# Patient Record
Sex: Female | Born: 1935
Health system: Southern US, Community
[De-identification: ages and names within clinical notes are randomized; demographics above are authoritative.]

## PROBLEM LIST (undated history)

## (undated) DIAGNOSIS — D61818 Other pancytopenia: Secondary | ICD-10-CM

## (undated) DIAGNOSIS — C9110 Chronic lymphocytic leukemia of B-cell type not having achieved remission: Secondary | ICD-10-CM

## (undated) DIAGNOSIS — C50919 Malignant neoplasm of unspecified site of unspecified female breast: Secondary | ICD-10-CM

## (undated) DIAGNOSIS — F419 Anxiety disorder, unspecified: Secondary | ICD-10-CM

## (undated) DIAGNOSIS — R011 Cardiac murmur, unspecified: Secondary | ICD-10-CM

## (undated) HISTORY — PX: BREAST SURGERY: SHX581

## (undated) HISTORY — PX: VAGINAL HYSTERECTOMY: SUR661

## (undated) HISTORY — PX: OTHER SURGICAL HISTORY: SHX169

---

## 1997-04-06 ENCOUNTER — Other Ambulatory Visit: Admission: RE | Admit: 1997-04-06 | Discharge: 1997-04-06 | Payer: Self-pay | Admitting: Obstetrics and Gynecology

## 1998-03-23 ENCOUNTER — Inpatient Hospital Stay (HOSPITAL_COMMUNITY): Admission: RE | Admit: 1998-03-23 | Discharge: 1998-03-25 | Payer: Self-pay | Admitting: Obstetrics and Gynecology

## 1999-05-10 ENCOUNTER — Other Ambulatory Visit: Admission: RE | Admit: 1999-05-10 | Discharge: 1999-05-10 | Payer: Self-pay | Admitting: Obstetrics and Gynecology

## 2001-06-26 ENCOUNTER — Other Ambulatory Visit: Admission: RE | Admit: 2001-06-26 | Discharge: 2001-06-26 | Payer: Self-pay | Admitting: Obstetrics and Gynecology

## 2001-10-05 ENCOUNTER — Encounter: Payer: Self-pay | Admitting: Internal Medicine

## 2001-10-05 ENCOUNTER — Encounter: Admission: RE | Admit: 2001-10-05 | Discharge: 2001-10-05 | Payer: Self-pay | Admitting: Internal Medicine

## 2001-11-03 ENCOUNTER — Encounter: Payer: Self-pay | Admitting: Orthopaedic Surgery

## 2001-11-05 ENCOUNTER — Ambulatory Visit (HOSPITAL_COMMUNITY): Admission: RE | Admit: 2001-11-05 | Discharge: 2001-11-06 | Payer: Self-pay | Admitting: Orthopaedic Surgery

## 2001-11-05 ENCOUNTER — Encounter: Payer: Self-pay | Admitting: Orthopaedic Surgery

## 2001-11-05 ENCOUNTER — Encounter (INDEPENDENT_AMBULATORY_CARE_PROVIDER_SITE_OTHER): Payer: Self-pay | Admitting: *Deleted

## 2001-12-01 ENCOUNTER — Other Ambulatory Visit: Admission: RE | Admit: 2001-12-01 | Discharge: 2001-12-01 | Payer: Self-pay | Admitting: *Deleted

## 2003-10-10 ENCOUNTER — Other Ambulatory Visit: Admission: RE | Admit: 2003-10-10 | Discharge: 2003-10-10 | Payer: Self-pay | Admitting: Obstetrics and Gynecology

## 2004-03-22 ENCOUNTER — Ambulatory Visit: Payer: Self-pay | Admitting: Oncology

## 2004-11-21 ENCOUNTER — Ambulatory Visit: Payer: Self-pay | Admitting: Oncology

## 2005-03-22 ENCOUNTER — Ambulatory Visit: Payer: Self-pay | Admitting: Oncology

## 2005-07-18 ENCOUNTER — Ambulatory Visit: Payer: Self-pay | Admitting: Oncology

## 2005-07-26 LAB — CBC WITH DIFFERENTIAL/PLATELET
Basophils Absolute: 0.1 10*3/uL (ref 0.0–0.1)
Eosinophils Absolute: 0.1 10*3/uL (ref 0.0–0.5)
HGB: 11.5 g/dL — ABNORMAL LOW (ref 11.6–15.9)
MCV: 107.8 fL — ABNORMAL HIGH (ref 81.0–101.0)
MONO#: 0.3 10*3/uL (ref 0.1–0.9)
NEUT#: 1.3 10*3/uL — ABNORMAL LOW (ref 1.5–6.5)
RBC: 3.16 10*6/uL — ABNORMAL LOW (ref 3.70–5.32)
RDW: 14.5 % (ref 11.3–14.5)
WBC: 30.3 10*3/uL — ABNORMAL HIGH (ref 3.9–10.0)
lymph#: 28.5 10*3/uL — ABNORMAL HIGH (ref 0.9–3.3)

## 2005-07-26 LAB — URINALYSIS, MICROSCOPIC - CHCC
COMMENTS:: NEGATIVE
Specific Gravity, Urine: 1.015 (ref 1.003–1.035)
pH: 6.5 (ref 4.6–8.0)

## 2005-07-30 LAB — URINE CULTURE

## 2005-10-23 ENCOUNTER — Ambulatory Visit: Payer: Self-pay | Admitting: Oncology

## 2005-10-25 LAB — CBC WITH DIFFERENTIAL/PLATELET
Basophils Absolute: 0.2 10*3/uL — ABNORMAL HIGH (ref 0.0–0.1)
EOS%: 0.3 % (ref 0.0–7.0)
HGB: 11.7 g/dL (ref 11.6–15.9)
MCH: 36.6 pg — ABNORMAL HIGH (ref 26.0–34.0)
MCV: 109 fL — ABNORMAL HIGH (ref 81.0–101.0)
MONO%: 1.1 % (ref 0.0–13.0)
NEUT#: 6.1 10*3/uL (ref 1.5–6.5)
RBC: 3.19 10*6/uL — ABNORMAL LOW (ref 3.70–5.32)
RDW: 15.1 % — ABNORMAL HIGH (ref 11.3–14.5)
lymph#: 41.5 10*3/uL — ABNORMAL HIGH (ref 0.9–3.3)

## 2006-01-28 ENCOUNTER — Ambulatory Visit: Payer: Self-pay | Admitting: Oncology

## 2006-01-31 LAB — CBC WITH DIFFERENTIAL/PLATELET
Basophils Absolute: 0.3 10*3/uL — ABNORMAL HIGH (ref 0.0–0.1)
EOS%: 0.2 % (ref 0.0–7.0)
Eosinophils Absolute: 0.1 10*3/uL (ref 0.0–0.5)
HCT: 35.7 % (ref 34.8–46.6)
HGB: 11.8 g/dL (ref 11.6–15.9)
MCH: 36 pg — ABNORMAL HIGH (ref 26.0–34.0)
MCV: 108.7 fL — ABNORMAL HIGH (ref 81.0–101.0)
MONO%: 0.9 % (ref 0.0–13.0)
NEUT#: 8.9 10*3/uL — ABNORMAL HIGH (ref 1.5–6.5)
NEUT%: 16 % — ABNORMAL LOW (ref 39.6–76.8)
Platelets: 105 10*3/uL — ABNORMAL LOW (ref 145–400)

## 2006-02-06 LAB — IVY BLEEDING TIME: Bleeding Time: >15 min (ref 2.0–8.0)

## 2006-02-06 LAB — APTT: aPTT: 29 seconds (ref 24–37)

## 2006-02-06 LAB — PROTHROMBIN TIME: INR: 1.1 (ref 0.0–1.5)

## 2006-02-10 LAB — VON WILLEBRAND PANEL
Factor-VIII Activity: 100 % (ref 75–150)
Ristocetin-Cofactor: 132 % (ref 50–150)
Von Willebrand Ag: 119 % normal (ref 61–164)

## 2006-02-10 LAB — ABO AND RH: Rh Type: POSITIVE

## 2006-02-24 LAB — CBC WITH DIFFERENTIAL/PLATELET
Basophils Absolute: 1.9 10*3/uL — ABNORMAL HIGH (ref 0.0–0.1)
EOS%: 0.2 % (ref 0.0–7.0)
Eosinophils Absolute: 0.1 10*3/uL (ref 0.0–0.5)
HCT: 32.9 % — ABNORMAL LOW (ref 34.8–46.6)
HGB: 11.2 g/dL — ABNORMAL LOW (ref 11.6–15.9)
MCH: 36.7 pg — ABNORMAL HIGH (ref 26.0–34.0)
MCV: 107.6 fL — ABNORMAL HIGH (ref 81.0–101.0)
MONO%: 1.7 % (ref 0.0–13.0)
NEUT#: 2.8 10*3/uL (ref 1.5–6.5)
NEUT%: 5.5 % — ABNORMAL LOW (ref 39.6–76.8)

## 2006-08-28 ENCOUNTER — Ambulatory Visit: Payer: Self-pay | Admitting: Oncology

## 2006-09-01 LAB — CBC WITH DIFFERENTIAL/PLATELET
HGB: 11.5 g/dL — ABNORMAL LOW (ref 11.6–15.9)
MCH: 35.6 pg — ABNORMAL HIGH (ref 26.0–34.0)
MCV: 105.9 fL — ABNORMAL HIGH (ref 81.0–101.0)
Platelets: 107 10*3/uL — ABNORMAL LOW (ref 145–400)
RDW: 14.9 % — ABNORMAL HIGH (ref 11.3–14.5)

## 2006-09-01 LAB — MANUAL DIFFERENTIAL
Band Neutrophils: 0 % (ref 0–10)
Basophil: 0 % (ref 0–2)
EOS: 0 % (ref 0–7)
LYMPH: 95 % — ABNORMAL HIGH (ref 14–49)
MONO: 0 % (ref 0–14)
PROMYELO: 0 % (ref 0–0)
nRBC: 0 % (ref 0–0)

## 2007-04-09 ENCOUNTER — Ambulatory Visit: Payer: Self-pay | Admitting: Oncology

## 2007-04-13 LAB — CBC WITH DIFFERENTIAL/PLATELET
BASO%: 0.3 % (ref 0.0–2.0)
EOS%: 0.2 % (ref 0.0–7.0)
MCH: 37.6 pg — ABNORMAL HIGH (ref 26.0–34.0)
MCHC: 33.5 g/dL (ref 32.0–36.0)
MCV: 112.2 fL — ABNORMAL HIGH (ref 81.0–101.0)
MONO%: 1.2 % (ref 0.0–13.0)
NEUT%: 13.6 % — ABNORMAL LOW (ref 39.6–76.8)
RDW: 19.1 % — ABNORMAL HIGH (ref 11.3–14.5)
lymph#: 59.5 10*3/uL — ABNORMAL HIGH (ref 0.9–3.3)

## 2007-08-10 ENCOUNTER — Ambulatory Visit: Payer: Self-pay | Admitting: Oncology

## 2007-08-13 LAB — CBC WITH DIFFERENTIAL/PLATELET
Eosinophils Absolute: 0.1 10*3/uL (ref 0.0–0.5)
HCT: 35.9 % (ref 34.8–46.6)
LYMPH%: 89.6 % — ABNORMAL HIGH (ref 14.0–48.0)
MCV: 105.6 fL — ABNORMAL HIGH (ref 81.0–101.0)
MONO%: 1 % (ref 0.0–13.0)
NEUT#: 5.4 10*3/uL (ref 1.5–6.5)
NEUT%: 8.9 % — ABNORMAL LOW (ref 39.6–76.8)
Platelets: 98 10*3/uL — ABNORMAL LOW (ref 145–400)
RBC: 3.4 10*6/uL — ABNORMAL LOW (ref 3.70–5.32)

## 2007-08-13 LAB — TECHNOLOGIST REVIEW

## 2008-01-22 HISTORY — PX: BREAST LUMPECTOMY: SHX2

## 2008-02-09 ENCOUNTER — Ambulatory Visit: Payer: Self-pay | Admitting: Oncology

## 2008-06-23 ENCOUNTER — Encounter (INDEPENDENT_AMBULATORY_CARE_PROVIDER_SITE_OTHER): Payer: Self-pay | Admitting: Diagnostic Radiology

## 2008-06-23 ENCOUNTER — Encounter: Admission: RE | Admit: 2008-06-23 | Discharge: 2008-06-23 | Payer: Self-pay | Admitting: Obstetrics and Gynecology

## 2008-07-04 ENCOUNTER — Encounter: Admission: RE | Admit: 2008-07-04 | Discharge: 2008-07-04 | Payer: Self-pay | Admitting: Obstetrics and Gynecology

## 2008-07-19 ENCOUNTER — Encounter (INDEPENDENT_AMBULATORY_CARE_PROVIDER_SITE_OTHER): Payer: Self-pay | Admitting: General Surgery

## 2008-07-19 ENCOUNTER — Encounter: Admission: RE | Admit: 2008-07-19 | Discharge: 2008-07-19 | Payer: Self-pay | Admitting: General Surgery

## 2008-07-19 ENCOUNTER — Ambulatory Visit (HOSPITAL_BASED_OUTPATIENT_CLINIC_OR_DEPARTMENT_OTHER): Admission: RE | Admit: 2008-07-19 | Discharge: 2008-07-19 | Payer: Self-pay | Admitting: General Surgery

## 2008-08-05 ENCOUNTER — Ambulatory Visit: Payer: Self-pay | Admitting: Oncology

## 2008-08-09 ENCOUNTER — Ambulatory Visit: Admission: RE | Admit: 2008-08-09 | Discharge: 2008-09-28 | Payer: Self-pay | Admitting: Oncology

## 2008-08-09 LAB — CBC WITH DIFFERENTIAL/PLATELET
Basophils Absolute: 0.1 10*3/uL (ref 0.0–0.1)
Eosinophils Absolute: 0 10*3/uL (ref 0.0–0.5)
HGB: 11.2 g/dL — ABNORMAL LOW (ref 11.6–15.9)
LYMPH%: 89.7 % — ABNORMAL HIGH (ref 14.0–49.7)
MCV: 108.6 fL — ABNORMAL HIGH (ref 79.5–101.0)
MONO#: 1.4 10*3/uL — ABNORMAL HIGH (ref 0.1–0.9)
MONO%: 3.2 % (ref 0.0–14.0)
NEUT#: 3 10*3/uL (ref 1.5–6.5)
Platelets: 82 10*3/uL — ABNORMAL LOW (ref 145–400)
RDW: 15.9 % — ABNORMAL HIGH (ref 11.2–14.5)

## 2008-11-24 ENCOUNTER — Ambulatory Visit: Payer: Self-pay | Admitting: Oncology

## 2008-11-24 LAB — CBC WITH DIFFERENTIAL/PLATELET
Basophils Absolute: 0 10*3/uL (ref 0.0–0.1)
Eosinophils Absolute: 0 10*3/uL (ref 0.0–0.5)
HGB: 11.9 g/dL (ref 11.6–15.9)
NEUT#: 3.2 10*3/uL (ref 1.5–6.5)
RDW: 15.3 % — ABNORMAL HIGH (ref 11.2–14.5)
WBC: 23.5 10*3/uL — ABNORMAL HIGH (ref 3.9–10.3)
lymph#: 19.4 10*3/uL — ABNORMAL HIGH (ref 0.9–3.3)

## 2009-03-07 ENCOUNTER — Encounter: Admission: RE | Admit: 2009-03-07 | Discharge: 2009-03-07 | Payer: Self-pay | Admitting: Radiation Oncology

## 2009-03-22 ENCOUNTER — Ambulatory Visit: Payer: Self-pay | Admitting: Oncology

## 2009-03-24 LAB — RETICULOCYTES
Immature Retic Fract: 14.7 % — ABNORMAL HIGH (ref 0.00–10.70)
RBC: 2 10*6/uL — ABNORMAL LOW (ref 3.70–5.45)
Retic Ct Abs: 38.2 10*3/uL (ref 18.30–72.70)

## 2009-03-24 LAB — CBC WITH DIFFERENTIAL/PLATELET
Eosinophils Absolute: 0.1 10*3/uL (ref 0.0–0.5)
HCT: 24.3 % — ABNORMAL LOW (ref 34.8–46.6)
LYMPH%: 83.3 % — ABNORMAL HIGH (ref 14.0–49.7)
MONO#: 0.8 10*3/uL (ref 0.1–0.9)
NEUT#: 4.1 10*3/uL (ref 1.5–6.5)
NEUT%: 13.3 % — ABNORMAL LOW (ref 38.4–76.8)
Platelets: 99 10*3/uL — ABNORMAL LOW (ref 145–400)
RBC: 1.93 10*6/uL — ABNORMAL LOW (ref 3.70–5.45)
WBC: 30.5 10*3/uL — ABNORMAL HIGH (ref 3.9–10.3)

## 2009-03-24 LAB — CHCC SMEAR

## 2009-03-25 LAB — COMPREHENSIVE METABOLIC PANEL
ALT: 19 U/L (ref 0–35)
AST: 17 U/L (ref 0–37)
Alkaline Phosphatase: 47 U/L (ref 39–117)
Calcium: 9.5 mg/dL (ref 8.4–10.5)
Chloride: 101 mEq/L (ref 96–112)
Creatinine, Ser: 0.83 mg/dL (ref 0.40–1.20)
Total Bilirubin: 0.6 mg/dL (ref 0.3–1.2)

## 2009-03-25 LAB — HAPTOGLOBIN: Haptoglobin: 100 mg/dL (ref 16–200)

## 2009-03-25 LAB — VITAMIN B12: Vitamin B-12: 360 pg/mL (ref 211–911)

## 2009-03-29 ENCOUNTER — Other Ambulatory Visit: Admission: RE | Admit: 2009-03-29 | Discharge: 2009-03-29 | Payer: Self-pay | Admitting: Oncology

## 2009-04-03 ENCOUNTER — Encounter (HOSPITAL_COMMUNITY): Admission: RE | Admit: 2009-04-03 | Discharge: 2009-07-02 | Payer: Self-pay | Admitting: Oncology

## 2009-04-03 LAB — CBC WITH DIFFERENTIAL/PLATELET
Basophils Absolute: 0.1 10*3/uL (ref 0.0–0.1)
Eosinophils Absolute: 0.1 10*3/uL (ref 0.0–0.5)
HGB: 7.9 g/dL — ABNORMAL LOW (ref 11.6–15.9)
MONO#: 1.4 10*3/uL — ABNORMAL HIGH (ref 0.1–0.9)
NEUT#: 3.8 10*3/uL (ref 1.5–6.5)
RBC: 1.8 10*6/uL — ABNORMAL LOW (ref 3.70–5.45)
RDW: 23.2 % — ABNORMAL HIGH (ref 11.2–14.5)
WBC: 30 10*3/uL — ABNORMAL HIGH (ref 3.9–10.3)
lymph#: 24.6 10*3/uL — ABNORMAL HIGH (ref 0.9–3.3)

## 2009-04-03 LAB — HOLD TUBE, BLOOD BANK

## 2009-04-11 LAB — CBC WITH DIFFERENTIAL/PLATELET
BASO%: 0.8 % (ref 0.0–2.0)
Eosinophils Absolute: 0 10*3/uL (ref 0.0–0.5)
LYMPH%: 30.4 % (ref 14.0–49.7)
MCHC: 35.6 g/dL (ref 31.5–36.0)
MONO#: 0.2 10*3/uL (ref 0.1–0.9)
MONO%: 6.1 % (ref 0.0–14.0)
NEUT#: 2 10*3/uL (ref 1.5–6.5)
Platelets: 63 10*3/uL — ABNORMAL LOW (ref 145–400)
RBC: 2.5 10*6/uL — ABNORMAL LOW (ref 3.70–5.45)
RDW: 33.4 % — ABNORMAL HIGH (ref 11.2–14.5)
WBC: 3.2 10*3/uL — ABNORMAL LOW (ref 3.9–10.3)

## 2009-04-11 LAB — HOLD TUBE, BLOOD BANK

## 2009-04-14 LAB — CBC WITH DIFFERENTIAL/PLATELET
BASO%: 0.3 % (ref 0.0–2.0)
Eosinophils Absolute: 0 10*3/uL (ref 0.0–0.5)
HGB: 7.6 g/dL — ABNORMAL LOW (ref 11.6–15.9)
MCH: 33.6 pg (ref 25.1–34.0)
MCHC: 33 g/dL (ref 31.5–36.0)
MONO#: 0.3 10*3/uL (ref 0.1–0.9)
WBC: 3.3 10*3/uL — ABNORMAL LOW (ref 3.9–10.3)
lymph#: 1.2 10*3/uL (ref 0.9–3.3)
nRBC: 0 % (ref 0–0)

## 2009-04-14 LAB — URIC ACID: Uric Acid, Serum: 6.4 mg/dL (ref 2.4–7.0)

## 2009-04-14 LAB — LACTATE DEHYDROGENASE: LDH: 138 U/L (ref 94–250)

## 2009-04-14 LAB — BASIC METABOLIC PANEL
Chloride: 101 mEq/L (ref 96–112)
Potassium: 4.2 mEq/L (ref 3.5–5.3)
Sodium: 137 mEq/L (ref 135–145)

## 2009-04-19 LAB — CBC WITH DIFFERENTIAL/PLATELET
Basophils Absolute: 0 10*3/uL (ref 0.0–0.1)
Eosinophils Absolute: 0 10*3/uL (ref 0.0–0.5)
HCT: 26.8 % — ABNORMAL LOW (ref 34.8–46.6)
HGB: 9.3 g/dL — ABNORMAL LOW (ref 11.6–15.9)
MONO#: 0.2 10*3/uL (ref 0.1–0.9)
NEUT#: 1.6 10*3/uL (ref 1.5–6.5)
NEUT%: 47.3 % (ref 38.4–76.8)
RDW: 27.1 % — ABNORMAL HIGH (ref 11.2–14.5)
WBC: 3.4 10*3/uL — ABNORMAL LOW (ref 3.9–10.3)
lymph#: 1.6 10*3/uL (ref 0.9–3.3)

## 2009-04-24 ENCOUNTER — Ambulatory Visit: Payer: Self-pay | Admitting: Oncology

## 2009-04-24 LAB — CBC WITH DIFFERENTIAL/PLATELET
Basophils Absolute: 0 10*3/uL (ref 0.0–0.1)
Eosinophils Absolute: 0 10*3/uL (ref 0.0–0.5)
HCT: 25.2 % — ABNORMAL LOW (ref 34.8–46.6)
HGB: 8.4 g/dL — ABNORMAL LOW (ref 11.6–15.9)
LYMPH%: 50 % — ABNORMAL HIGH (ref 14.0–49.7)
MCV: 95.5 fL (ref 79.5–101.0)
MONO#: 0.2 10*3/uL (ref 0.1–0.9)
NEUT#: 1.3 10*3/uL — ABNORMAL LOW (ref 1.5–6.5)
NEUT%: 41.1 % (ref 38.4–76.8)
Platelets: 35 10*3/uL — ABNORMAL LOW (ref 145–400)
RBC: 2.64 10*6/uL — ABNORMAL LOW (ref 3.70–5.45)
WBC: 3 10*3/uL — ABNORMAL LOW (ref 3.9–10.3)
nRBC: 0 % (ref 0–0)

## 2009-05-01 LAB — COMPREHENSIVE METABOLIC PANEL
ALT: 23 U/L (ref 0–35)
AST: 18 U/L (ref 0–37)
Albumin: 4.3 g/dL (ref 3.5–5.2)
BUN: 20 mg/dL (ref 6–23)
CO2: 28 mEq/L (ref 19–32)
Calcium: 9.1 mg/dL (ref 8.4–10.5)
Chloride: 104 mEq/L (ref 96–112)
Potassium: 3.6 mEq/L (ref 3.5–5.3)

## 2009-05-01 LAB — CBC WITH DIFFERENTIAL/PLATELET
BASO%: 0 % (ref 0.0–2.0)
EOS%: 0.3 % (ref 0.0–7.0)
HGB: 6.8 g/dL — CL (ref 11.6–15.9)
MCH: 31.1 pg (ref 25.1–34.0)
MCHC: 33.3 g/dL (ref 31.5–36.0)
RDW: 20.4 % — ABNORMAL HIGH (ref 11.2–14.5)
WBC: 3.3 10*3/uL — ABNORMAL LOW (ref 3.9–10.3)
lymph#: 2.6 10*3/uL (ref 0.9–3.3)

## 2009-05-03 ENCOUNTER — Encounter (INDEPENDENT_AMBULATORY_CARE_PROVIDER_SITE_OTHER): Payer: Self-pay | Admitting: *Deleted

## 2009-05-08 LAB — CBC WITH DIFFERENTIAL/PLATELET
Eosinophils Absolute: 0 10*3/uL (ref 0.0–0.5)
MONO#: 0.5 10*3/uL (ref 0.1–0.9)
NEUT#: 2.3 10*3/uL (ref 1.5–6.5)
RBC: 3.06 10*6/uL — ABNORMAL LOW (ref 3.70–5.45)
RDW: 19.6 % — ABNORMAL HIGH (ref 11.2–14.5)
WBC: 5.4 10*3/uL (ref 3.9–10.3)
lymph#: 2.6 10*3/uL (ref 0.9–3.3)

## 2009-05-16 LAB — CBC WITH DIFFERENTIAL/PLATELET
Eosinophils Absolute: 0 10*3/uL (ref 0.0–0.5)
HCT: 16.3 % — ABNORMAL LOW (ref 34.8–46.6)
LYMPH%: 35.6 % (ref 14.0–49.7)
MONO#: 0.1 10*3/uL (ref 0.1–0.9)
NEUT#: 0.7 10*3/uL — ABNORMAL LOW (ref 1.5–6.5)
NEUT%: 56.6 % (ref 38.4–76.8)
Platelets: 52 10*3/uL — ABNORMAL LOW (ref 145–400)
RBC: 1.85 10*6/uL — ABNORMAL LOW (ref 3.70–5.45)
WBC: 1.2 10*3/uL — ABNORMAL LOW (ref 3.9–10.3)

## 2009-05-17 LAB — TYPE & CROSSMATCH - CHCC

## 2009-05-18 LAB — CBC WITH DIFFERENTIAL/PLATELET
Eosinophils Absolute: 0 10*3/uL (ref 0.0–0.5)
HCT: 22.7 % — ABNORMAL LOW (ref 34.8–46.6)
HGB: 8.1 g/dL — ABNORMAL LOW (ref 11.6–15.9)
MCV: 86.6 fL (ref 79.5–101.0)
MONO#: 0.1 10*3/uL (ref 0.1–0.9)
Platelets: 45 10*3/uL — ABNORMAL LOW (ref 145–400)
RBC: 2.62 10*6/uL — ABNORMAL LOW (ref 3.70–5.45)
RDW: 17.1 % — ABNORMAL HIGH (ref 11.2–14.5)

## 2009-05-22 LAB — CBC WITH DIFFERENTIAL/PLATELET
BASO%: 0.2 % (ref 0.0–2.0)
EOS%: 1.3 % (ref 0.0–7.0)
Eosinophils Absolute: 0 10*3/uL (ref 0.0–0.5)
LYMPH%: 19.2 % (ref 14.0–49.7)
MCH: 30.6 pg (ref 25.1–34.0)
MONO#: 0.1 10*3/uL (ref 0.1–0.9)
MONO%: 6.5 % (ref 0.0–14.0)
NEUT#: 1.6 10*3/uL (ref 1.5–6.5)
NEUT%: 72.8 % (ref 38.4–76.8)
RBC: 2.32 10*6/uL — ABNORMAL LOW (ref 3.70–5.45)

## 2009-05-22 LAB — HOLD TUBE, BLOOD BANK

## 2009-05-23 LAB — TYPE & CROSSMATCH - CHCC

## 2009-05-24 ENCOUNTER — Ambulatory Visit: Payer: Self-pay | Admitting: Oncology

## 2009-05-29 LAB — CBC WITH DIFFERENTIAL/PLATELET
Basophils Absolute: 0 10*3/uL (ref 0.0–0.1)
EOS%: 2.1 % (ref 0.0–7.0)
Eosinophils Absolute: 0 10*3/uL (ref 0.0–0.5)
LYMPH%: 32.2 % (ref 14.0–49.7)
MCH: 29.4 pg (ref 25.1–34.0)
MCHC: 34.3 g/dL (ref 31.5–36.0)
MONO%: 11.6 % (ref 0.0–14.0)
NEUT%: 53.6 % (ref 38.4–76.8)
Platelets: 19 10*3/uL — ABNORMAL LOW (ref 145–400)
RDW: 14.3 % (ref 11.2–14.5)
WBC: 1.1 10*3/uL — ABNORMAL LOW (ref 3.9–10.3)
lymph#: 0.3 10*3/uL — ABNORMAL LOW (ref 0.9–3.3)

## 2009-06-01 LAB — CBC WITH DIFFERENTIAL/PLATELET
EOS%: 0.9 % (ref 0.0–7.0)
HGB: 7.7 g/dL — ABNORMAL LOW (ref 11.6–15.9)
MCHC: 33.5 g/dL (ref 31.5–36.0)
NEUT#: 0.6 10*3/uL — ABNORMAL LOW (ref 1.5–6.5)
RDW: 13.8 % (ref 11.2–14.5)
WBC: 1.1 10*3/uL — ABNORMAL LOW (ref 3.9–10.3)

## 2009-06-02 LAB — CBC WITH DIFFERENTIAL/PLATELET
BASO%: 0.5 % (ref 0.0–2.0)
EOS%: 0.9 % (ref 0.0–7.0)
Eosinophils Absolute: 0 10*3/uL (ref 0.0–0.5)
HGB: 8.3 g/dL — ABNORMAL LOW (ref 11.6–15.9)
LYMPH%: 30.8 % (ref 14.0–49.7)
MCH: 29.7 pg (ref 25.1–34.0)
MCHC: 35.3 g/dL (ref 31.5–36.0)
MCV: 84.1 fL (ref 79.5–101.0)
MONO#: 0.1 10*3/uL (ref 0.1–0.9)
NEUT#: 0.8 10*3/uL — ABNORMAL LOW (ref 1.5–6.5)
Platelets: 14 10*3/uL — ABNORMAL LOW (ref 145–400)
RBC: 2.8 10*6/uL — ABNORMAL LOW (ref 3.70–5.45)
WBC: 1.3 10*3/uL — ABNORMAL LOW (ref 3.9–10.3)

## 2009-06-02 LAB — TYPE & CROSSMATCH - CHCC

## 2009-06-06 LAB — CBC WITH DIFFERENTIAL/PLATELET
BASO%: 1.1 % (ref 0.0–2.0)
EOS%: 2.7 % (ref 0.0–7.0)
HGB: 7.3 g/dL — ABNORMAL LOW (ref 11.6–15.9)
MCHC: 34.4 g/dL (ref 31.5–36.0)
MCV: 81.5 fL (ref 79.5–101.0)
NEUT%: 58.2 % (ref 38.4–76.8)
WBC: 1.8 10*3/uL — ABNORMAL LOW (ref 3.9–10.3)

## 2009-06-09 LAB — CBC WITH DIFFERENTIAL/PLATELET
Basophils Absolute: 0 10*3/uL (ref 0.0–0.1)
EOS%: 0.7 % (ref 0.0–7.0)
HGB: 6.3 g/dL — CL (ref 11.6–15.9)
LYMPH%: 25.2 % (ref 14.0–49.7)
MCH: 27.5 pg (ref 25.1–34.0)
MCV: 81.7 fL (ref 79.5–101.0)
MONO%: 7.2 % (ref 0.0–14.0)
Platelets: 16 10*3/uL — ABNORMAL LOW (ref 145–400)
WBC: 1.4 10*3/uL — ABNORMAL LOW (ref 3.9–10.3)
nRBC: 0 % (ref 0–0)

## 2009-06-09 LAB — TYPE & CROSSMATCH - CHCC

## 2009-06-12 LAB — CBC WITH DIFFERENTIAL/PLATELET
Basophils Absolute: 0 10*3/uL (ref 0.0–0.1)
EOS%: 0 % (ref 0.0–7.0)
HGB: 9.2 g/dL — ABNORMAL LOW (ref 11.6–15.9)
MCH: 28.1 pg (ref 25.1–34.0)
MCV: 82 fL (ref 79.5–101.0)
MONO%: 1.8 % (ref 0.0–14.0)
NEUT#: 1.4 10*3/uL — ABNORMAL LOW (ref 1.5–6.5)
RBC: 3.27 10*6/uL — ABNORMAL LOW (ref 3.70–5.45)
RDW: 13.3 % (ref 11.2–14.5)
lymph#: 0.2 10*3/uL — ABNORMAL LOW (ref 0.9–3.3)

## 2009-06-12 LAB — HOLD TUBE, BLOOD BANK

## 2009-06-15 LAB — CBC WITH DIFFERENTIAL/PLATELET
Basophils Absolute: 0 10*3/uL (ref 0.0–0.1)
Eosinophils Absolute: 0 10*3/uL (ref 0.0–0.5)
HGB: 9.1 g/dL — ABNORMAL LOW (ref 11.6–15.9)
MONO#: 0.1 10*3/uL (ref 0.1–0.9)
NEUT#: 0.9 10*3/uL — ABNORMAL LOW (ref 1.5–6.5)
RBC: 3.07 10*6/uL — ABNORMAL LOW (ref 3.70–5.45)
RDW: 13.8 % (ref 11.2–14.5)
WBC: 1.4 10*3/uL — ABNORMAL LOW (ref 3.9–10.3)
lymph#: 0.4 10*3/uL — ABNORMAL LOW (ref 0.9–3.3)

## 2009-06-23 ENCOUNTER — Ambulatory Visit: Payer: Self-pay | Admitting: Oncology

## 2009-06-23 LAB — CBC WITH DIFFERENTIAL/PLATELET
BASO%: 0.4 % (ref 0.0–2.0)
Basophils Absolute: 0 10*3/uL (ref 0.0–0.1)
EOS%: 0.4 % (ref 0.0–7.0)
Eosinophils Absolute: 0 10*3/uL (ref 0.0–0.5)
MCH: 29.9 pg (ref 25.1–34.0)
MCHC: 36.3 g/dL — ABNORMAL HIGH (ref 31.5–36.0)
MONO%: 9.7 % (ref 0.0–14.0)
NEUT#: 1 10*3/uL — ABNORMAL LOW (ref 1.5–6.5)
NEUT%: 66 % (ref 38.4–76.8)
RBC: 2.36 10*6/uL — ABNORMAL LOW (ref 3.70–5.45)
WBC: 1.6 10*3/uL — ABNORMAL LOW (ref 3.9–10.3)
lymph#: 0.4 10*3/uL — ABNORMAL LOW (ref 0.9–3.3)

## 2009-06-23 LAB — TYPE & CROSSMATCH - CHCC

## 2009-06-30 LAB — HOLD TUBE, BLOOD BANK

## 2009-06-30 LAB — CBC WITH DIFFERENTIAL/PLATELET
Basophils Absolute: 0 10*3/uL (ref 0.0–0.1)
Eosinophils Absolute: 0 10*3/uL (ref 0.0–0.5)
LYMPH%: 26.2 % (ref 14.0–49.7)
MCHC: 35.7 g/dL (ref 31.5–36.0)
MCV: 82.7 fL (ref 79.5–101.0)
NEUT#: 0.7 10*3/uL — ABNORMAL LOW (ref 1.5–6.5)
RBC: 3.08 10*6/uL — ABNORMAL LOW (ref 3.70–5.45)
RDW: 13.8 % (ref 11.2–14.5)
WBC: 1.2 10*3/uL — ABNORMAL LOW (ref 3.9–10.3)

## 2009-07-07 ENCOUNTER — Encounter (HOSPITAL_COMMUNITY): Admission: RE | Admit: 2009-07-07 | Discharge: 2009-10-05 | Payer: Self-pay | Admitting: Oncology

## 2009-07-07 LAB — CBC WITH DIFFERENTIAL/PLATELET
BASO%: 0.1 % (ref 0.0–2.0)
EOS%: 0.8 % (ref 0.0–7.0)
HCT: 20.2 % — ABNORMAL LOW (ref 34.8–46.6)
HGB: 7.3 g/dL — ABNORMAL LOW (ref 11.6–15.9)
NEUT#: 0.9 10*3/uL — ABNORMAL LOW (ref 1.5–6.5)
NEUT%: 66.8 % (ref 38.4–76.8)
Platelets: 41 10*3/uL — ABNORMAL LOW (ref 145–400)
RBC: 2.44 10*6/uL — ABNORMAL LOW (ref 3.70–5.45)
RDW: 13.3 % (ref 11.2–14.5)

## 2009-07-14 LAB — CBC WITH DIFFERENTIAL/PLATELET
BASO%: 0.3 % (ref 0.0–2.0)
Eosinophils Absolute: 0 10*3/uL (ref 0.0–0.5)
LYMPH%: 21.6 % (ref 14.0–49.7)
MCH: 28.9 pg (ref 25.1–34.0)
MCHC: 34.2 g/dL (ref 31.5–36.0)
MONO#: 0.3 10*3/uL (ref 0.1–0.9)
NEUT#: 1.1 10*3/uL — ABNORMAL LOW (ref 1.5–6.5)
Platelets: 52 10*3/uL — ABNORMAL LOW (ref 145–400)
RBC: 3.23 10*6/uL — ABNORMAL LOW (ref 3.70–5.45)
RDW: 13.6 % (ref 11.2–14.5)
WBC: 1.7 10*3/uL — ABNORMAL LOW (ref 3.9–10.3)
lymph#: 0.4 10*3/uL — ABNORMAL LOW (ref 0.9–3.3)

## 2009-07-14 LAB — HOLD TUBE, BLOOD BANK

## 2009-07-20 LAB — CBC WITH DIFFERENTIAL/PLATELET
Eosinophils Absolute: 0 10*3/uL (ref 0.0–0.5)
HCT: 21.7 % — ABNORMAL LOW (ref 34.8–46.6)
MCHC: 34.1 g/dL (ref 31.5–36.0)
NEUT%: 59.2 % (ref 38.4–76.8)
Platelets: 46 10*3/uL — ABNORMAL LOW (ref 145–400)
lymph#: 0.3 10*3/uL — ABNORMAL LOW (ref 0.9–3.3)
nRBC: 0 % (ref 0–0)

## 2009-07-25 ENCOUNTER — Ambulatory Visit: Payer: Self-pay | Admitting: Oncology

## 2009-07-27 LAB — CBC WITH DIFFERENTIAL/PLATELET
BASO%: 1.3 % (ref 0.0–2.0)
Basophils Absolute: 0 10e3/uL (ref 0.0–0.1)
EOS%: 1.3 % (ref 0.0–7.0)
Eosinophils Absolute: 0 10e3/uL (ref 0.0–0.5)
HCT: 27.7 % — ABNORMAL LOW (ref 34.8–46.6)
HGB: 9.9 g/dL — ABNORMAL LOW (ref 11.6–15.9)
LYMPH%: 21.7 % (ref 14.0–49.7)
MCH: 31 pg (ref 25.1–34.0)
MCHC: 35.8 g/dL (ref 31.5–36.0)
MCV: 86.5 fL (ref 79.5–101.0)
MONO#: 0.2 10e3/uL (ref 0.1–0.9)
MONO%: 11.4 % (ref 0.0–14.0)
NEUT#: 1.3 10e3/uL — ABNORMAL LOW (ref 1.5–6.5)
NEUT%: 64.3 % (ref 38.4–76.8)
Platelets: 62 10e3/uL — ABNORMAL LOW (ref 145–400)
RBC: 3.2 10e6/uL — ABNORMAL LOW (ref 3.70–5.45)
RDW: 14.1 % (ref 11.2–14.5)
WBC: 2 10e3/uL — ABNORMAL LOW (ref 3.9–10.3)
lymph#: 0.4 10e3/uL — ABNORMAL LOW (ref 0.9–3.3)

## 2009-07-27 LAB — HOLD TUBE, BLOOD BANK

## 2009-07-31 ENCOUNTER — Encounter: Admission: RE | Admit: 2009-07-31 | Discharge: 2009-07-31 | Payer: Self-pay | Admitting: Oncology

## 2009-08-03 LAB — HOLD TUBE, BLOOD BANK

## 2009-08-03 LAB — CBC WITH DIFFERENTIAL/PLATELET
HGB: 8.9 g/dL — ABNORMAL LOW (ref 11.6–15.9)
LYMPH%: 15.8 % (ref 14.0–49.7)
MCH: 31.5 pg (ref 25.1–34.0)
MCV: 86.8 fL (ref 79.5–101.0)
MONO%: 12.5 % (ref 0.0–14.0)
NEUT#: 1.7 10*3/uL (ref 1.5–6.5)
Platelets: 72 10*3/uL — ABNORMAL LOW (ref 145–400)
RBC: 2.83 10*6/uL — ABNORMAL LOW (ref 3.70–5.45)

## 2009-08-17 LAB — CBC WITH DIFFERENTIAL/PLATELET
EOS%: 0 % (ref 0.0–7.0)
HCT: 18.6 % — ABNORMAL LOW (ref 34.8–46.6)
HGB: 6.3 g/dL — CL (ref 11.6–15.9)
MCV: 92.1 fL (ref 79.5–101.0)
MONO%: 13.8 % (ref 0.0–14.0)
NEUT#: 1.2 10*3/uL — ABNORMAL LOW (ref 1.5–6.5)
Platelets: 62 10*3/uL — ABNORMAL LOW (ref 145–400)
RDW: 19.6 % — ABNORMAL HIGH (ref 11.2–14.5)
WBC: 1.7 10*3/uL — ABNORMAL LOW (ref 3.9–10.3)

## 2009-08-25 ENCOUNTER — Ambulatory Visit: Payer: Self-pay | Admitting: Oncology

## 2009-08-29 LAB — CBC WITH DIFFERENTIAL/PLATELET
BASO%: 0.5 % (ref 0.0–2.0)
Eosinophils Absolute: 0 10*3/uL (ref 0.0–0.5)
HCT: 24.3 % — ABNORMAL LOW (ref 34.8–46.6)
MCV: 96.8 fL (ref 79.5–101.0)
NEUT#: 1.3 10*3/uL — ABNORMAL LOW (ref 1.5–6.5)
NEUT%: 59.5 % (ref 38.4–76.8)
Platelets: 60 10*3/uL — ABNORMAL LOW (ref 145–400)
WBC: 2.2 10*3/uL — ABNORMAL LOW (ref 3.9–10.3)
lymph#: 0.5 10*3/uL — ABNORMAL LOW (ref 0.9–3.3)
nRBC: 1 % — ABNORMAL HIGH (ref 0–0)

## 2009-09-01 ENCOUNTER — Other Ambulatory Visit: Admission: RE | Admit: 2009-09-01 | Discharge: 2009-09-01 | Payer: Self-pay | Admitting: Oncology

## 2009-09-07 LAB — CBC WITH DIFFERENTIAL/PLATELET
Basophils Absolute: 0 10*3/uL (ref 0.0–0.1)
Eosinophils Absolute: 0 10*3/uL (ref 0.0–0.5)
HCT: 23.9 % — ABNORMAL LOW (ref 34.8–46.6)
LYMPH%: 16.6 % (ref 14.0–49.7)
MCH: 36.3 pg — ABNORMAL HIGH (ref 25.1–34.0)
MCV: 103.2 fL — ABNORMAL HIGH (ref 79.5–101.0)
MONO%: 11 % (ref 0.0–14.0)
Platelets: 61 10*3/uL — ABNORMAL LOW (ref 145–400)

## 2009-09-21 LAB — CBC WITH DIFFERENTIAL/PLATELET
Basophils Absolute: 0 10*3/uL (ref 0.0–0.1)
EOS%: 1 % (ref 0.0–7.0)
HCT: 23.2 % — ABNORMAL LOW (ref 34.8–46.6)
LYMPH%: 18.1 % (ref 14.0–49.7)
MCHC: 34.4 g/dL (ref 31.5–36.0)
MONO#: 0.2 10*3/uL (ref 0.1–0.9)

## 2009-10-05 ENCOUNTER — Ambulatory Visit: Payer: Self-pay | Admitting: Oncology

## 2009-10-05 LAB — CBC WITH DIFFERENTIAL/PLATELET
EOS%: 0.8 % (ref 0.0–7.0)
Eosinophils Absolute: 0 10*3/uL (ref 0.0–0.5)
HCT: 24.8 % — ABNORMAL LOW (ref 34.8–46.6)
HGB: 8.3 g/dL — ABNORMAL LOW (ref 11.6–15.9)
MCHC: 33.5 g/dL (ref 31.5–36.0)
MONO#: 0.2 10*3/uL (ref 0.1–0.9)
NEUT#: 1.3 10*3/uL — ABNORMAL LOW (ref 1.5–6.5)
Platelets: 66 10*3/uL — ABNORMAL LOW (ref 145–400)
RBC: 2.1 10*6/uL — ABNORMAL LOW (ref 3.70–5.45)
RDW: 27.6 % — ABNORMAL HIGH (ref 11.2–14.5)
lymph#: 0.4 10*3/uL — ABNORMAL LOW (ref 0.9–3.3)

## 2009-10-19 LAB — CBC WITH DIFFERENTIAL/PLATELET
EOS%: 1.1 % (ref 0.0–7.0)
Eosinophils Absolute: 0 10*3/uL (ref 0.0–0.5)
HGB: 8.3 g/dL — ABNORMAL LOW (ref 11.6–15.9)
LYMPH%: 21.1 % (ref 14.0–49.7)
MCH: 42.5 pg — ABNORMAL HIGH (ref 25.1–34.0)
MCHC: 35 g/dL (ref 31.5–36.0)
MCV: 121.6 fL — ABNORMAL HIGH (ref 79.5–101.0)
RBC: 1.95 10*6/uL — ABNORMAL LOW (ref 3.70–5.45)
WBC: 1.5 10*3/uL — ABNORMAL LOW (ref 3.9–10.3)
lymph#: 0.3 10*3/uL — ABNORMAL LOW (ref 0.9–3.3)

## 2009-11-02 LAB — CBC WITH DIFFERENTIAL/PLATELET
BASO%: 0.6 % (ref 0.0–2.0)
Basophils Absolute: 0 10*3/uL (ref 0.0–0.1)
Eosinophils Absolute: 0 10*3/uL (ref 0.0–0.5)
HCT: 27.1 % — ABNORMAL LOW (ref 34.8–46.6)
MCHC: 35.3 g/dL (ref 31.5–36.0)
MCV: 122.9 fL — ABNORMAL HIGH (ref 79.5–101.0)
MONO%: 11.7 % (ref 0.0–14.0)
NEUT#: 0.9 10*3/uL — ABNORMAL LOW (ref 1.5–6.5)
NEUT%: 63.5 % (ref 38.4–76.8)
Platelets: 67 10*3/uL — ABNORMAL LOW (ref 145–400)
RDW: 18.8 % — ABNORMAL HIGH (ref 11.2–14.5)
WBC: 1.3 10*3/uL — ABNORMAL LOW (ref 3.9–10.3)
lymph#: 0.3 10*3/uL — ABNORMAL LOW (ref 0.9–3.3)

## 2009-12-08 ENCOUNTER — Ambulatory Visit: Payer: Self-pay | Admitting: Oncology

## 2009-12-12 LAB — CBC WITH DIFFERENTIAL/PLATELET
Basophils Absolute: 0 10*3/uL (ref 0.0–0.1)
EOS%: 1.2 % (ref 0.0–7.0)
Eosinophils Absolute: 0 10*3/uL (ref 0.0–0.5)
HCT: 29.4 % — ABNORMAL LOW (ref 34.8–46.6)
HGB: 10.2 g/dL — ABNORMAL LOW (ref 11.6–15.9)
LYMPH%: 46.9 % (ref 14.0–49.7)
MCH: 40.2 pg — ABNORMAL HIGH (ref 25.1–34.0)
MCV: 115.7 fL — ABNORMAL HIGH (ref 79.5–101.0)
MONO%: 17.3 % — ABNORMAL HIGH (ref 0.0–14.0)
NEUT#: 0.3 10*3/uL — CL (ref 1.5–6.5)
NEUT%: 34.6 % — ABNORMAL LOW (ref 38.4–76.8)
Platelets: 64 10*3/uL — ABNORMAL LOW (ref 145–400)
RDW: 13.8 % (ref 11.2–14.5)

## 2009-12-19 LAB — CBC WITH DIFFERENTIAL/PLATELET
BASO%: 0.8 % (ref 0.0–2.0)
HCT: 27.8 % — ABNORMAL LOW (ref 34.8–46.6)
LYMPH%: 46.2 % (ref 14.0–49.7)
MCHC: 34.9 g/dL (ref 31.5–36.0)
MCV: 119.4 fL — ABNORMAL HIGH (ref 79.5–101.0)
MONO#: 0.3 10*3/uL (ref 0.1–0.9)
MONO%: 28.4 % — ABNORMAL HIGH (ref 0.0–14.0)
NEUT%: 23.4 % — ABNORMAL LOW (ref 38.4–76.8)
Platelets: 68 10*3/uL — ABNORMAL LOW (ref 145–400)
WBC: 1.1 10*3/uL — ABNORMAL LOW (ref 3.9–10.3)

## 2009-12-27 LAB — CBC WITH DIFFERENTIAL/PLATELET
Basophils Absolute: 0 10*3/uL (ref 0.0–0.1)
EOS%: 0.7 % (ref 0.0–7.0)
Eosinophils Absolute: 0 10*3/uL (ref 0.0–0.5)
HCT: 28.7 % — ABNORMAL LOW (ref 34.8–46.6)
HGB: 9.9 g/dL — ABNORMAL LOW (ref 11.6–15.9)
MCH: 41.4 pg — ABNORMAL HIGH (ref 25.1–34.0)
MCV: 120.4 fL — ABNORMAL HIGH (ref 79.5–101.0)
MONO%: 5.9 % (ref 0.0–14.0)
NEUT#: 2 10*3/uL (ref 1.5–6.5)
NEUT%: 75.5 % (ref 38.4–76.8)
lymph#: 0.5 10*3/uL — ABNORMAL LOW (ref 0.9–3.3)

## 2010-01-10 ENCOUNTER — Ambulatory Visit: Payer: Self-pay | Admitting: Oncology

## 2010-01-10 LAB — CBC WITH DIFFERENTIAL/PLATELET
Basophils Absolute: 0 10*3/uL (ref 0.0–0.1)
EOS%: 0.7 % (ref 0.0–7.0)
Eosinophils Absolute: 0 10*3/uL (ref 0.0–0.5)
HGB: 10.6 g/dL — ABNORMAL LOW (ref 11.6–15.9)
LYMPH%: 16.5 % (ref 14.0–49.7)
MCH: 43 pg — ABNORMAL HIGH (ref 25.1–34.0)
MCV: 121.3 fL — ABNORMAL HIGH (ref 79.5–101.0)
MONO%: 5.6 % (ref 0.0–14.0)
NEUT#: 2.4 10*3/uL (ref 1.5–6.5)
Platelets: 75 10*3/uL — ABNORMAL LOW (ref 145–400)
RDW: 16.6 % — ABNORMAL HIGH (ref 11.2–14.5)

## 2010-01-10 LAB — URINALYSIS, MICROSCOPIC - CHCC
Bilirubin (Urine): NEGATIVE
Nitrite: POSITIVE
Protein: <30 mg/dL
pH: 6 (ref 4.6–8.0)

## 2010-02-22 NOTE — Letter (Signed)
Summary: Colonoscopy Letter  [facility]  [address] [place], [place] [address]   Phone: [phone]  Fax: [phone]      May 03, 2009 MRN: [id number]   Anne Doyle [address] [place], [place]  [address]   Dear [name],   According to your medical record, it is time for you to schedule a Colonoscopy. The American Cancer Society recommends this procedure as a method to detect early colon cancer. Patients with a family history of colon cancer, or a personal history of colon polyps or inflammatory bowel disease are at increased risk.  This letter has beeen generated based on the recommendations made at the time of your procedure. If you feel that in your particular situation this may no longer apply, please contact our office.  Please call our office at [phone] to schedule this appointment or to update your records at your earliest convenience.  Thank you for cooperating with [place] to provide you with the very best care possible.   Sincerely,  Hedwig Morton. Juanda Chance, M.D.  [facility] [phone]

## 2010-04-06 LAB — DIFFERENTIAL
Basophils Relative: 0 % (ref 0–1)
Eosinophils Absolute: 0 10*3/uL (ref 0.0–0.7)
Monocytes Absolute: 0.1 10*3/uL (ref 0.1–1.0)
Neutro Abs: 1.6 10*3/uL — ABNORMAL LOW (ref 1.7–7.7)

## 2010-04-06 LAB — CBC
HCT: 23.6 % — ABNORMAL LOW (ref 36.0–46.0)
MCHC: 34.7 g/dL (ref 30.0–36.0)
MCV: 100.1 fL — ABNORMAL HIGH (ref 78.0–100.0)
Platelets: 66 10*3/uL — ABNORMAL LOW (ref 150–400)
RDW: 14.9 % (ref 11.5–15.5)

## 2010-04-07 LAB — CROSSMATCH

## 2010-04-08 LAB — CROSSMATCH
ABO/RH(D): O POS
Antibody Screen: NEGATIVE
Antibody Screen: NEGATIVE

## 2010-04-09 LAB — CROSSMATCH: ABO/RH(D): O POS

## 2010-04-10 LAB — CROSSMATCH
ABO/RH(D): O POS
ABO/RH(D): O POS
Antibody Screen: NEGATIVE

## 2010-04-11 LAB — CROSSMATCH
ABO/RH(D): O POS
Antibody Screen: NEGATIVE

## 2010-04-11 LAB — SAMPLE TO BLOOD BANK

## 2010-04-12 ENCOUNTER — Other Ambulatory Visit: Payer: Self-pay | Admitting: Oncology

## 2010-04-12 ENCOUNTER — Encounter (HOSPITAL_BASED_OUTPATIENT_CLINIC_OR_DEPARTMENT_OTHER): Payer: Medicare Other | Admitting: Oncology

## 2010-04-12 DIAGNOSIS — D61818 Other pancytopenia: Secondary | ICD-10-CM

## 2010-04-12 DIAGNOSIS — C9110 Chronic lymphocytic leukemia of B-cell type not having achieved remission: Secondary | ICD-10-CM

## 2010-04-12 DIAGNOSIS — D0590 Unspecified type of carcinoma in situ of unspecified breast: Secondary | ICD-10-CM

## 2010-04-12 LAB — CBC WITH DIFFERENTIAL/PLATELET
BASO%: 0.2 % (ref 0.0–2.0)
EOS%: 0.7 % (ref 0.0–7.0)
LYMPH%: 21.1 % (ref 14.0–49.7)
MCH: 42 pg — ABNORMAL HIGH (ref 25.1–34.0)
MCHC: 34.3 g/dL (ref 31.5–36.0)
MONO#: 0.1 10*3/uL (ref 0.1–0.9)
RBC: 2.36 10*6/uL — ABNORMAL LOW (ref 3.70–5.45)
WBC: 2.3 10*3/uL — ABNORMAL LOW (ref 3.9–10.3)
lymph#: 0.5 10*3/uL — ABNORMAL LOW (ref 0.9–3.3)

## 2010-04-16 LAB — CROSSMATCH
Antibody Screen: NEGATIVE
Antibody Screen: NEGATIVE

## 2010-04-16 LAB — BONE MARROW EXAM

## 2010-04-30 LAB — BASIC METABOLIC PANEL
CO2: 29 mEq/L (ref 19–32)
Chloride: 106 mEq/L (ref 96–112)
GFR calc Af Amer: >60 mL/min (ref >60)
Potassium: 4.4 mEq/L (ref 3.5–5.1)
Sodium: 141 mEq/L (ref 135–145)

## 2010-04-30 LAB — CBC
HCT: 34 % — ABNORMAL LOW (ref 36.0–46.0)
Hemoglobin: 11.4 g/dL — ABNORMAL LOW (ref 12.0–15.0)
MCHC: 33.5 g/dL (ref 30.0–36.0)
MCV: 109 fL — ABNORMAL HIGH (ref 78.0–100.0)
RBC: 3.12 MIL/uL — ABNORMAL LOW (ref 3.87–5.11)
WBC: 50.8 10*3/uL (ref 4.0–10.5)

## 2010-04-30 LAB — DIFFERENTIAL
Basophils Relative: 0 % (ref 0–1)
Eosinophils Absolute: 0 10*3/uL (ref 0.0–0.7)
Eosinophils Relative: 0 % (ref 0–5)
Monocytes Absolute: 2 10*3/uL — ABNORMAL HIGH (ref 0.1–1.0)
Neutro Abs: 2.5 10*3/uL (ref 1.7–7.7)
Neutrophils Relative %: 5 % — ABNORMAL LOW (ref 43–77)
Smear Review: DECREASED

## 2010-06-01 ENCOUNTER — Other Ambulatory Visit: Payer: Self-pay | Admitting: Oncology

## 2010-06-01 ENCOUNTER — Encounter (HOSPITAL_BASED_OUTPATIENT_CLINIC_OR_DEPARTMENT_OTHER): Payer: Medicare Other | Admitting: Oncology

## 2010-06-01 DIAGNOSIS — D0590 Unspecified type of carcinoma in situ of unspecified breast: Secondary | ICD-10-CM

## 2010-06-01 DIAGNOSIS — R350 Frequency of micturition: Secondary | ICD-10-CM

## 2010-06-01 DIAGNOSIS — C9110 Chronic lymphocytic leukemia of B-cell type not having achieved remission: Secondary | ICD-10-CM

## 2010-06-01 LAB — URINALYSIS, MICROSCOPIC - CHCC
Bilirubin (Urine): NEGATIVE
Glucose: NEGATIVE g/dL
Ketones: NEGATIVE mg/dL
pH: 6 (ref 4.6–8.0)

## 2010-06-04 LAB — URINE CULTURE

## 2010-06-05 NOTE — Op Note (Signed)
NAME:  Anne Doyle, Anne Doyle             ACCOUNT NO.:  [id number]   MEDICAL RECORD NO.:  [id number]          PATIENT TYPE:  AMB   LOCATION:  DSC                          FACILITY:  [facility]   PHYSICIAN:  Sharlet Salina T. Hoxworth, M.D.DATE OF BIRTH:  1928-08-21   DATE OF PROCEDURE:  07/19/2008  DATE OF DISCHARGE:                               OPERATIVE REPORT   PREOPERATIVE DIAGNOSIS:  Ductal carcinoma in situ, left breast.   POSTOPERATIVE DIAGNOSIS:  Ductal carcinoma in situ, left breast.   SURGICAL PROCEDURES:  Needle-localized left breast lumpectomy.   SURGEON:  Lorne Skeens. Hoxworth, MD   ANESTHESIA:  Laryngeal mask and general.   BRIEF HISTORY:  Anne Doyle is a 77 year old female, who on a recent  screening mammogram was found to have approximately 1 cm area of  clustered microcalcifications in the lateral left breast.  Large core  needle biopsy has revealed high-grade ductal carcinoma in situ.  After  discussion of initial surgical treatment options, we have elected to  proceed with needle-localized left breast lumpectomy.  The nature of  procedure, indications, risks of bleeding, infection, and possible need  for further surgery based on final pathology was discussed and  understood.  She is now brought to the operating room for this  procedure.   DESCRIPTION OF OPERATION:  Following needle localization procedure at  the [facility], the patient was brought to Day Surgery and taken to  the operating room and general laryngeal mask anesthesia was induced.  The left breast was widely sterilely prepped and draped.  Correct  patient and procedure were verified.  She received preoperative IV  antibiotics.  Anti-embolism stockings were placed.  A curvilinear  incision was used in the lateral breast and dissection carried down  through the subcutaneous tissue.  Dissection was carried down on to the  wire which was brought into the wound.  Following this, a generous  specimen of breast  tissue was excised around the shaft and the tip of  the wire with cautery and the specimen removed.  The specimen was  oriented with ink.  The specimen mammography revealed the clip well  positioned in the center of the specimen.  Hemostasis was obtained with  cautery and a couple of bleeding points sutured with figure-of-8 Vicryl.  The wound was irrigated and the deep tissue was infiltrated with  Marcaine with epinephrine.  Hemostasis was assured.  The subcu was then  closed with interrupted 3-0 Vicryl and the skin with subcuticular 4-0  Monocryl and Dermabond.  Sponges, needle, and instrument counts were  correct.  The patient was taken to the recovery room in good condition.      Lorne Skeens. Hoxworth, M.D.  Electronically Signed     BTH/MEDQ  D:  07/19/2008  T:  07/20/2008  Job:  [id number]

## 2010-06-08 NOTE — H&P (Signed)
NAME:  Anne Doyle, Anne Doyle                         ACCOUNT NO.:  [id number]   MEDICAL RECORD NO.:  [id number]                   PATIENT TYPE:   LOCATION:                                       FACILITY:   PHYSICIAN:  Sharolyn Douglas, MD                       DATE OF BIRTH:  09-13-1928   DATE OF ADMISSION:  11/05/2001  DATE OF DISCHARGE:                                HISTORY & PHYSICAL   CHIEF COMPLAINT:  Back pain.   HISTORY OF PRESENT ILLNESS:  The patient is a 77 year old female with back  pain that has been severe  in nature. It has been limiting her activities of  daily living. He has significantly increased with any type of motion or  weight bearing activity. It is interfering with her quality of  life.  She  is failing to respond to conservative measures. The risks and benefits of  surgery were discussed with the patient. She indicated understanding and  opted to proceed.   ALLERGIES:  SULFA CAUSES FLUSHED SKIN.   MEDICATIONS:  1. Synthroid 88 mcg p.o. q.d.  2. _________ 60 mg p.o. q.d.  3. Lisinopril/hydrochlorothiazide 20/12.5 p.o. q.d.  4. Paxil 20 mg 1/2 tablet p.o. q.d.   PAST MEDICAL HISTORY:  1. Hypertension.  2. Hypothyroidism.  3. Anxiety.  4. History of heart murmur.   PAST SURGICAL HISTORY:  Hysterectomy.   SOCIAL HISTORY:  The patient denies tobacco, denies alcohol. She is married.  Her husband is in good health and be available to help with her  postoperative course.   FAMILY HISTORY:  Mother is deceased at age 61 secondary to liver cancer.  Father deceased at age 66 of old age.   REVIEW OF SYSTEMS:  GENERAL:  The patient denies any fever or chills, sweats  or bleeding tendencies. CNS: Blurred vision, double vision, seizures,  headaches, paralysis. CARDIOVASCULAR:  Denies chest pain, angina, orthopnea,  claudication or palpitations. PULMONARY:  Denies shortness of breath,  productive cough or hemoptysis. GI: Denies nausea or vomiting, constipation,  diarrhea.  URINARY:  Denies dysuria and hematuria or discharge.  MUSCULOSKELETAL:  Denies any paresthesias, numbness or paralysis.   PHYSICAL EXAMINATION:  VITAL SIGNS:  Blood pressure 160/80, respirations 60  and unlabored, pulse 120 and regular. She does have a rapid heart rate which  is chronic for her. Her medical doctor is aware.  GENERAL:  The patient is a 77 year old female who is alert and oriented and  no acute distress, well developed, well groomed. Appears her stated age.  Pleasant, cooperative to examination. She is in obvious discomfort when  moving about  the examination room.  HEENT:  Normocephalic, atraumatic. Pupils equally round and reactive to  light. Extraocular movements intact. Nares patent. Pharynx is clear.  NECK:  Soft to palpation, no lymphadenopathy or thyromegaly noted.  CHEST:  Clear to auscultation bilaterally. No rales, rhonchi, stridor  or  wheezes or friction rubs.  HEART:  She has a grade 3/6 holosystolic murmur. No gallops or rubs. Regular  rhythm, tachycardic.  ABDOMEN:  Soft to palpation. Positive bowel sounds, nontender, nondistended,  no organomegaly noted.  EXTREMITIES:  She was neurovascularly intact in bilateral lower extremities.  Motor exam and sensory exam were intact. Pulses were intact and equal.  SKIN:  Intact without lesions or rashes.  BREASTS, GU,  RECTAL:  Not indicated, not performed.   LABORATORY DATA:  An x-ray shows a T7 compression fracture.   IMPRESSION:  1. A T7 compression fracture.  2. Hypertension.  3. Hypothyroidism.  4. Anxiety.  5. Tachycardia.  6. Systolic heart murmur.   PLAN:  1. Admit to [facility] on November 05, 2001,  for a T7 _____ to be     done by Dr. Sharolyn Douglas.  2. The patient's primary care physician is Dr. Garlan Fillers at [facility].      Verlin Fester, P.A.                       Sharolyn Douglas, MD    CM/MEDQ  D:  11/05/2001  T:  11/06/2001  Job:  [id number]

## 2010-06-08 NOTE — Op Note (Signed)
NAME:  Anne Doyle, Anne Doyle                       ACCOUNT NO.:  [id number]   MEDICAL RECORD NO.:  [id number]                   PATIENT TYPE:  OIB   LOCATION:  2871                                 FACILITY:  [facility]   PHYSICIAN:  Sharolyn Douglas, MD                       DATE OF BIRTH:  [birth year]   DATE OF PROCEDURE:  11/05/2001  DATE OF DISCHARGE:                                 OPERATIVE REPORT   PREOPERATIVE DIAGNOSIS:  T7 fracture.   PROCEDURES:  1. Closed reduction of T7 vertebral body fracture.  2. Bone biopsy, T7 vertebral body.  3. Percutaneous thoracic vertebroplasty, T7.  4. Radiographic interpretation of fluoroscopic images used for     vertebroplasty.   SURGEON:  Sharolyn Douglas, M.D.   ASSISTANT:  Verlin Fester, P.A.   ANESTHESIA:  General.   COMPLICATIONS:  None.   INDICATIONS:  The patient is a 77 year old female who was in her usual state  of good health until one month ago, when she tripped over the bed when the  lights were off.  She developed acute pain in the midthoracic area.  X-rays  revealed a T7 compression fracture.  She had an MRI scan that showed this  was an acute injury with ligamentous injury posteriorly.  There was greater  than 50% anterior column collapse with local kyphosis.  Her pain was  unresponsive to conservative treatment, including bracing and narcotic pain  medications.  She was counseled on kyphoplasty to partially restore her  deformity and provide stability of the fracture and hopefully pain relief.  The risks, benefits, and alternatives were extensively reviewed with her,  and she elected to proceed.   DESCRIPTION OF PROCEDURE:  The patient was properly identified in the  holding area and taken to the operating room.  She underwent general  endotracheal anesthesia without difficulty.  She was given prophylactic IV  antibiotics.  She was turned prone onto the operating room table, bolsters  were placed under her chest and pelvis, and the legs  were extended in order  to posturally reduce the fracture.  This was done with fluoroscopic  guidance.  Once she was adequately positioned and the fracture was partially  reduced, the back was prepped and draped in the usual sterile fashion.  Biplanar fluoroscopy was then brought into the operative field, and true AP  and lateral images were obtained of the T7 vertebral level.  A Jamshidi  needle was then placed into the left pedicle under direct fluoroscopic  visualization after a small stab incision was made.  A lateral to medial  trajectory was taken so that the needle would end up in the midline in the  AP plane.  We then passed a guidewire through the Jamshidi needle and a  working cannula was established over this.  A biopsy trocar was then placed  through the working cannula and a core of  bone taken and sent to pathology.  The intervertebral bone tamp was then placed through the working cannula  into the midline of the vertebral body in the AP plane.  We then inflated  the bone tamp to a total of 3 cc.  We directly observed the reduction of the  fracture under AP and lateral fluoroscopic visualization.  We elevated the  anterior column approximately to 75% of normal.  We then removed the balloon  tamp, and partially-hardened methyl methacrylate bone cement mixed with  barium was pushed through the working cannula into the void created with the  bone tamp.  Live AP, lateral fluoroscopic images were used throughout the  process of laying the cement to be sure that there was no unwanted  extravasation.  The working cannula was then removed.  Final AP, lateral  fluoroscopic images were saved.  The wound was irrigated and then closed  with two interrupted 3-0 nylon sutures.  Ten cubic centimeters of 0.5%  Marcaine were injected around the wound for postoperative anesthesia.  A  sterile dressing was applied.  The patient was turned supine and extubated  without difficulty, moving her upper  and lower extremities when she was  transferred to the recovery room in stable condition.                                               Sharolyn Douglas, MD    MC/MEDQ  D:  11/05/2001  T:  11/07/2001  Job:  [id number]

## 2010-06-29 ENCOUNTER — Other Ambulatory Visit: Payer: Self-pay | Admitting: Oncology

## 2010-06-29 ENCOUNTER — Other Ambulatory Visit: Payer: Self-pay | Admitting: Family Medicine

## 2010-06-29 DIAGNOSIS — Z853 Personal history of malignant neoplasm of breast: Secondary | ICD-10-CM

## 2010-07-05 ENCOUNTER — Encounter (HOSPITAL_BASED_OUTPATIENT_CLINIC_OR_DEPARTMENT_OTHER): Payer: Medicare Other | Admitting: Oncology

## 2010-07-05 ENCOUNTER — Other Ambulatory Visit: Payer: Self-pay | Admitting: Oncology

## 2010-07-05 DIAGNOSIS — D61818 Other pancytopenia: Secondary | ICD-10-CM

## 2010-07-05 DIAGNOSIS — D0590 Unspecified type of carcinoma in situ of unspecified breast: Secondary | ICD-10-CM

## 2010-07-05 DIAGNOSIS — C9110 Chronic lymphocytic leukemia of B-cell type not having achieved remission: Secondary | ICD-10-CM

## 2010-07-05 LAB — CBC & DIFF AND RETIC
BASO%: 0 % (ref 0.0–2.0)
HCT: 29.4 % — ABNORMAL LOW (ref 34.8–46.6)
Immature Retic Fract: 14.2 % — ABNORMAL HIGH (ref 0.00–10.70)
MCHC: 34.4 g/dL (ref 31.5–36.0)
MONO#: 0.1 10*3/uL (ref 0.1–0.9)
RBC: 2.5 10*6/uL — ABNORMAL LOW (ref 3.70–5.45)
Retic %: 1.98 % — ABNORMAL HIGH (ref 0.50–1.50)
WBC: 1.6 10*3/uL — ABNORMAL LOW (ref 3.9–10.3)
lymph#: 0.4 10*3/uL — ABNORMAL LOW (ref 0.9–3.3)

## 2010-07-05 LAB — CHCC SMEAR

## 2010-07-06 LAB — COMPREHENSIVE METABOLIC PANEL
AST: 18 U/L (ref 0–37)
Alkaline Phosphatase: 42 U/L (ref 39–117)
BUN: 12 mg/dL (ref 6–23)
Creatinine, Ser: 0.79 mg/dL (ref 0.50–1.10)
Potassium: 3.7 mEq/L (ref 3.5–5.3)

## 2010-07-06 LAB — DIRECT ANTIGLOBULIN TEST (NOT AT ARMC)
DAT (Complement): NEGATIVE
DAT IgG: NEGATIVE

## 2010-08-08 ENCOUNTER — Telehealth: Payer: Self-pay | Admitting: *Deleted

## 2010-08-08 NOTE — Telephone Encounter (Signed)
Patient is overdue for colonoscopy to follow up on colon polyps found in 2004. I have spoken to patient. She states that she has gotten the letter but has leukemia and has been told by her physician that she should probably hold off on colonoscopy for now. Advised patient to call us back to schedule procedure when she gets her health concerns under control. Patient verbalizes understanding.

## 2010-08-14 ENCOUNTER — Ambulatory Visit
Admission: RE | Admit: 2010-08-14 | Discharge: 2010-08-14 | Disposition: A | Discharge status: Home / Self Care | Payer: Medicare Other | Source: Ambulatory Visit | Attending: Oncology | Admitting: Oncology

## 2010-08-14 DIAGNOSIS — Z853 Personal history of malignant neoplasm of breast: Secondary | ICD-10-CM

## 2010-10-02 ENCOUNTER — Encounter (HOSPITAL_BASED_OUTPATIENT_CLINIC_OR_DEPARTMENT_OTHER): Payer: Medicare Other | Admitting: Oncology

## 2010-10-02 ENCOUNTER — Other Ambulatory Visit: Payer: Self-pay | Admitting: Oncology

## 2010-10-02 DIAGNOSIS — D61818 Other pancytopenia: Secondary | ICD-10-CM

## 2010-10-02 DIAGNOSIS — D0590 Unspecified type of carcinoma in situ of unspecified breast: Secondary | ICD-10-CM

## 2010-10-02 DIAGNOSIS — D6189 Other specified aplastic anemias and other bone marrow failure syndromes: Secondary | ICD-10-CM

## 2010-10-02 DIAGNOSIS — C9110 Chronic lymphocytic leukemia of B-cell type not having achieved remission: Secondary | ICD-10-CM

## 2010-10-02 LAB — CBC WITH DIFFERENTIAL/PLATELET
Basophils Absolute: 0 10*3/uL (ref 0.0–0.1)
Eosinophils Absolute: 0 10*3/uL (ref 0.0–0.5)
HCT: 26.9 % — ABNORMAL LOW (ref 34.8–46.6)
HGB: 9.4 g/dL — ABNORMAL LOW (ref 11.6–15.9)
LYMPH%: 15 % (ref 14.0–49.7)
MONO#: 0.1 10*3/uL (ref 0.1–0.9)
NEUT#: 1.7 10*3/uL (ref 1.5–6.5)
NEUT%: 80.1 % — ABNORMAL HIGH (ref 38.4–76.8)
Platelets: 57 10*3/uL — ABNORMAL LOW (ref 145–400)
WBC: 2.1 10*3/uL — ABNORMAL LOW (ref 3.9–10.3)

## 2011-01-01 ENCOUNTER — Other Ambulatory Visit (HOSPITAL_BASED_OUTPATIENT_CLINIC_OR_DEPARTMENT_OTHER): Payer: Medicare Other | Admitting: Lab

## 2011-01-01 ENCOUNTER — Ambulatory Visit (HOSPITAL_BASED_OUTPATIENT_CLINIC_OR_DEPARTMENT_OTHER): Payer: Medicare Other | Admitting: Nurse Practitioner

## 2011-01-01 ENCOUNTER — Other Ambulatory Visit: Payer: Self-pay | Admitting: *Deleted

## 2011-01-01 ENCOUNTER — Telehealth: Payer: Self-pay | Admitting: Oncology

## 2011-01-01 VITALS — BP 133/75 | HR 90 | Temp 97.1°F | Ht 64.5 in | Wt 146.5 lb

## 2011-01-01 DIAGNOSIS — R52 Pain, unspecified: Secondary | ICD-10-CM

## 2011-01-01 DIAGNOSIS — C9110 Chronic lymphocytic leukemia of B-cell type not having achieved remission: Secondary | ICD-10-CM

## 2011-01-01 DIAGNOSIS — D649 Anemia, unspecified: Secondary | ICD-10-CM

## 2011-01-01 DIAGNOSIS — E039 Hypothyroidism, unspecified: Secondary | ICD-10-CM

## 2011-01-01 DIAGNOSIS — D0510 Intraductal carcinoma in situ of unspecified breast: Secondary | ICD-10-CM

## 2011-01-01 DIAGNOSIS — D0590 Unspecified type of carcinoma in situ of unspecified breast: Secondary | ICD-10-CM

## 2011-01-01 LAB — CBC WITH DIFFERENTIAL/PLATELET
BASO%: 0.1 % (ref 0.0–2.0)
HCT: 22.1 % — ABNORMAL LOW (ref 34.8–46.6)
LYMPH%: 11.9 % — ABNORMAL LOW (ref 14.0–49.7)
MCH: 44.7 pg — ABNORMAL HIGH (ref 25.1–34.0)
MCHC: 34.5 g/dL (ref 31.5–36.0)
MCV: 129.5 fL — ABNORMAL HIGH (ref 79.5–101.0)
MONO#: 0.1 10*3/uL (ref 0.1–0.9)
NEUT%: 83.5 % — ABNORMAL HIGH (ref 38.4–76.8)
Platelets: 61 10*3/uL — ABNORMAL LOW (ref 145–400)
WBC: 2.7 10*3/uL — ABNORMAL LOW (ref 3.9–10.3)

## 2011-01-01 NOTE — Telephone Encounter (Signed)
gve the pt her dec,jan 2013 appt calendar °

## 2011-01-02 NOTE — Progress Notes (Signed)
OFFICE PROGRESS NOTE  Interval history:  Anne Doyle is an 77 year old woman with CLL. She is seen today for scheduled followup. She denies fevers or sweats. Approximately 1 month ago she developed a productive cough. The cough has resolved. She has a good appetite. She denies weight loss. She reports a good energy level. She denies bleeding. She reports that she has a "pinched nerve" in the lower back. She has had a cortisone injection. She is taking Advil.   Objective: Blood pressure 133/75, pulse 90, temperature 97.1 F (36.2 C), temperature source Oral, height 5' 4.5" (1.638 m), weight 146 lb 8 oz (66.452 kg).  Oropharynx is without thrush or ulceration. No palpable cervical, supraclavicular, axillary or inguinal lymph nodes. Lungs are clear. No wheezes or rales. Regular cardiac rhythm. Abdomen is soft and nontender. No organomegaly. Extremities are without edema. Motor strength is 5 over 5.   Lab Results: Lab Results  Component Value Date   WBC 2.7* 01/01/2011   HGB 7.7* 01/01/2011   HCT 22.1* 01/01/2011   MCV 129.5* 01/01/2011   PLT 61* 01/01/2011     Studies/Results: No results found.  Medications: I have reviewed the patient's current medications.  Assessment/Plan:  1. Chronic lymphocytic leukemia, status post two cycles of fludarabine/rituximab with cycle #2 beginning on 05/08/2009. 2. Pancytopenia, status post a follow-up bone marrow biopsy, 09/01/2009, with involvement by CLL identified.   3. History of a prolonged bleeding time.  A diagnostic evaluation including a von Willebrand panel and platelet function studies were negative. 4. Left breast ductal carcinoma in situ, status post a needle localized lumpectomy, 07/19/2008, followed by left breast radiation.  She continues tamoxifen. 5. Chills during the Rituxan infusion, 04/05/2009.  She was premedicated with Decadron at home prior to cycle #2.  She tolerated cycle #2 without significant acute toxicity aside from  upper chest and facial erythema. 6. Marked neutropenia on labs, 11/22 and 12/19/2009, likely a delayed nadir related to rituximab.  Resolved. 7. Macrocytic anemia, potentially related to chemotherapy. Progressive.  Disposition-Anne Doyle has progressive anemia. Anne Doyle is concerned that the anemia is due to hemolysis. We are obtaining a reticulocyte count and will request a peripheral blood smear. If there is evidence of hemolysis we will begin steroids. We are tentatively scheduling her to return for a lab visit 01/14/2011 and a followup visit with Anne Doyle 01/28/2011. She will contact the office the interim with any problems.   Anne Doyle ANP/GNP-BC

## 2011-01-14 ENCOUNTER — Telehealth: Payer: Self-pay | Admitting: *Deleted

## 2011-01-14 ENCOUNTER — Other Ambulatory Visit (HOSPITAL_BASED_OUTPATIENT_CLINIC_OR_DEPARTMENT_OTHER): Payer: Medicare Other | Admitting: Lab

## 2011-01-14 DIAGNOSIS — C9110 Chronic lymphocytic leukemia of B-cell type not having achieved remission: Secondary | ICD-10-CM

## 2011-01-14 LAB — CBC & DIFF AND RETIC
BASO%: 0 % (ref 0.0–2.0)
Eosinophils Absolute: 0 10*3/uL (ref 0.0–0.5)
HCT: 22.2 % — ABNORMAL LOW (ref 34.8–46.6)
Immature Retic Fract: 26 % — ABNORMAL HIGH (ref 1.60–10.00)
MCHC: 32.9 g/dL (ref 31.5–36.0)
MONO#: 0.3 10*3/uL (ref 0.1–0.9)
NEUT#: 4.5 10*3/uL (ref 1.5–6.5)
NEUT%: 86.1 % — ABNORMAL HIGH (ref 38.4–76.8)
Platelets: 87 10*3/uL — ABNORMAL LOW (ref 145–400)
Retic Ct Abs: 90.48 10*3/uL (ref 33.70–90.70)
WBC: 5.2 10*3/uL (ref 3.9–10.3)
lymph#: 0.4 10*3/uL — ABNORMAL LOW (ref 0.9–3.3)
nRBC: 0 % (ref 0–0)

## 2011-01-14 LAB — COMPREHENSIVE METABOLIC PANEL
AST: 17 U/L (ref 0–37)
Albumin: 4.8 g/dL (ref 3.5–5.2)
Alkaline Phosphatase: 48 U/L (ref 39–117)
BUN: 12 mg/dL (ref 6–23)
Calcium: 9.1 mg/dL (ref 8.4–10.5)
Chloride: 105 mEq/L (ref 96–112)
Potassium: 4 mEq/L (ref 3.5–5.3)
Sodium: 141 mEq/L (ref 135–145)
Total Protein: 6.1 g/dL (ref 6.0–8.3)

## 2011-01-14 LAB — HOLD TUBE, BLOOD BANK

## 2011-01-14 NOTE — Telephone Encounter (Signed)
Reviewed lab results with patient. She does not feel symptomatic as far as needing a transfusion at this time. Called blood bank to cancel the hold clot. MD will review labs.

## 2011-01-28 ENCOUNTER — Other Ambulatory Visit (HOSPITAL_BASED_OUTPATIENT_CLINIC_OR_DEPARTMENT_OTHER): Payer: Medicare Other | Admitting: Lab

## 2011-01-28 ENCOUNTER — Other Ambulatory Visit: Payer: Self-pay | Admitting: Oncology

## 2011-01-28 ENCOUNTER — Telehealth: Payer: Self-pay | Admitting: Oncology

## 2011-01-28 ENCOUNTER — Ambulatory Visit (HOSPITAL_BASED_OUTPATIENT_CLINIC_OR_DEPARTMENT_OTHER): Payer: Medicare Other | Admitting: Oncology

## 2011-01-28 VITALS — BP 171/70 | HR 116 | Temp 98.6°F | Ht 64.5 in | Wt 145.4 lb

## 2011-01-28 DIAGNOSIS — D539 Nutritional anemia, unspecified: Secondary | ICD-10-CM

## 2011-01-28 DIAGNOSIS — D649 Anemia, unspecified: Secondary | ICD-10-CM

## 2011-01-28 DIAGNOSIS — Z853 Personal history of malignant neoplasm of breast: Secondary | ICD-10-CM

## 2011-01-28 DIAGNOSIS — Z79810 Long term (current) use of selective estrogen receptor modulators (SERMs): Secondary | ICD-10-CM

## 2011-01-28 DIAGNOSIS — C9110 Chronic lymphocytic leukemia of B-cell type not having achieved remission: Secondary | ICD-10-CM

## 2011-01-28 LAB — CBC & DIFF AND RETIC
Eosinophils Absolute: 0 10*3/uL (ref 0.0–0.5)
LYMPH%: 12.3 % — ABNORMAL LOW (ref 14.0–49.7)
MONO#: 0.1 10*3/uL (ref 0.1–0.9)
NEUT#: 3.3 10*3/uL (ref 1.5–6.5)
Platelets: 80 10*3/uL — ABNORMAL LOW (ref 145–400)
RBC: 1.45 10*6/uL — ABNORMAL LOW (ref 3.70–5.45)
RDW: 14 % (ref 11.2–14.5)
Retic %: 2.37 % — ABNORMAL HIGH (ref 0.70–2.10)
Retic Ct Abs: 34.37 10*3/uL (ref 33.70–90.70)
WBC: 4 10*3/uL (ref 3.9–10.3)

## 2011-01-28 NOTE — Progress Notes (Signed)
OFFICE PROGRESS NOTE   INTERVAL HISTORY:   She returns as scheduled. She reports developing an upper respiratory infection approximately 3 weeks ago. She was evaluated by Dr. Wylene Simmer. She reports this included a chest x-ray. She was initially treated with doxycycline. The antibiotic was switched to Avelox when her symptoms did not improve. She has been on Avelox for the past 5 days and reports improvement. She continues to have a cough. She denies shortness of breath. There was a fever at the onset of her symptoms and this has resolved.  She does not feel in need of a red cell transfusion.  Objective:  Vital signs in last 24 hours:  Blood pressure 171/70, pulse 116, temperature 98.6 F (37 C), temperature source Oral, height 5' 4.5" (1.638 m), weight 145 lb 6.4 oz (65.953 kg).    HEENT: Pharynx without exudate. No thrush. Lymphatics: No cervical, supraclavicular, axillary, or inguinal lymph nodes Resp: Scattered and inspiratory bronchial sounds and wheezing. End inspiratory rhonchi at the left posterior base. No respiratory distress. Cardio: Regular rate and rhythm GI: No hepatosplenomegaly Vascular: No leg edema     Lab Results:  Lab Results  Component Value Date   WBC 4.0 01/28/2011   HGB 6.1* 01/28/2011   HCT 18.0* 01/28/2011   MCV 124.1* 01/28/2011   PLT 80* 01/28/2011   ANC 3.3  LDH 174, bilirubin 1.2, AST 17, absolute reticulocyte count 90.5, reticulocyte percent 5.17-12/24/2012   Medications: I have reviewed the patient's current medications.  Assessment/Plan: 1. Chronic lymphocytic leukemia, status post two cycles of fludarabine/rituximab with cycle #2 beginning on 05/08/2009. 2. Pancytopenia, status post a follow-up bone marrow biopsy, 09/01/2009, with involvement by CLL identified.  3. History of a prolonged bleeding time. A diagnostic evaluation including a von Willebrand panel and platelet function studies were negative. 4. Left breast ductal carcinoma in situ, status  post a needle localized lumpectomy, 07/19/2008, followed by left breast radiation. She continues tamoxifen. 5. Chills during the Rituxan infusion, 04/05/2009. She was premedicated with Decadron at home prior to cycle #2. She tolerated cycle #2 without significant acute toxicity aside from upper chest and facial erythema. 6. Marked neutropenia on labs, 11/22 and 12/19/2009, likely a delayed nadir related to rituximab. Resolved. 7. Macrocytic anemia-progressive, likely due to bone marrow involvement by CLL. A laboratory evaluation on 01/14/2011 did not suggest hemolysis. We obtained a reticulocyte count and direct antiglobulin test today. If the reticulocyte count is more elevated or the DAT is positive the plan is to begin a trial of prednisone. 8. Upper respiratory infection-likely a viral URI. She will complete a course of Avelox. She will contact myself or Dr.Tisovec if she develops shortness of breath.      Disposition:  She has progressive anemia. This is likely related to CLL involving the bone marrow. She declined a red cell transfusion at present. She'll return for a repeat CBC and transfusion support as needed on 02/01/2011. She is scheduled for an office visit on 02/06/2011. The plan is to proceed with a bone marrow biopsy to confirm involvement with CLL if the severe anemia persists. I will recommend treatment with bendamustine if there is significant bone marrow involvement by CLL.   Lucile Shutters, MD  01/28/2011  5:49 PM

## 2011-01-28 NOTE — Telephone Encounter (Signed)
gve the pt her jan 2013 appt

## 2011-01-29 LAB — DIRECT ANTIGLOBULIN TEST (NOT AT ARMC): DAT IgG: NEGATIVE

## 2011-02-01 ENCOUNTER — Other Ambulatory Visit (HOSPITAL_BASED_OUTPATIENT_CLINIC_OR_DEPARTMENT_OTHER): Payer: Medicare Other | Admitting: Lab

## 2011-02-01 ENCOUNTER — Encounter (HOSPITAL_COMMUNITY)
Admission: RE | Admit: 2011-02-01 | Discharge: 2011-02-01 | Disposition: A | Discharge status: Home / Self Care | Payer: Medicare Other | Source: Ambulatory Visit | Attending: Oncology | Admitting: Oncology

## 2011-02-01 DIAGNOSIS — D649 Anemia, unspecified: Secondary | ICD-10-CM

## 2011-02-01 LAB — CBC WITH DIFFERENTIAL/PLATELET
Eosinophils Absolute: 0 10*3/uL (ref 0.0–0.5)
MONO#: 0.2 10*3/uL (ref 0.1–0.9)
NEUT#: 3.1 10*3/uL (ref 1.5–6.5)
RBC: 1.56 10*6/uL — ABNORMAL LOW (ref 3.70–5.45)
RDW: 14.8 % — ABNORMAL HIGH (ref 11.2–14.5)
WBC: 3.9 10*3/uL (ref 3.9–10.3)
lymph#: 0.6 10*3/uL — ABNORMAL LOW (ref 0.9–3.3)
nRBC: 0 % (ref 0–0)

## 2011-02-01 LAB — HOLD TUBE, BLOOD BANK

## 2011-02-01 LAB — SAMPLE TO BLOOD BANK

## 2011-02-05 ENCOUNTER — Encounter (HOSPITAL_COMMUNITY): Payer: Medicare Other

## 2011-02-06 ENCOUNTER — Other Ambulatory Visit: Payer: Medicare Other

## 2011-02-06 ENCOUNTER — Telehealth: Payer: Self-pay | Admitting: Oncology

## 2011-02-06 ENCOUNTER — Ambulatory Visit (HOSPITAL_COMMUNITY)
Admission: RE | Admit: 2011-02-06 | Discharge: 2011-02-06 | Disposition: A | Discharge status: Home / Self Care | Payer: Medicare Other | Source: Ambulatory Visit | Attending: Oncology | Admitting: Oncology

## 2011-02-06 ENCOUNTER — Other Ambulatory Visit: Payer: Self-pay | Admitting: Oncology

## 2011-02-06 ENCOUNTER — Ambulatory Visit (HOSPITAL_BASED_OUTPATIENT_CLINIC_OR_DEPARTMENT_OTHER): Payer: Medicare Other | Admitting: Oncology

## 2011-02-06 DIAGNOSIS — D649 Anemia, unspecified: Secondary | ICD-10-CM

## 2011-02-06 DIAGNOSIS — R05 Cough: Secondary | ICD-10-CM | POA: Insufficient documentation

## 2011-02-06 DIAGNOSIS — C9110 Chronic lymphocytic leukemia of B-cell type not having achieved remission: Secondary | ICD-10-CM

## 2011-02-06 DIAGNOSIS — R059 Cough, unspecified: Secondary | ICD-10-CM | POA: Insufficient documentation

## 2011-02-06 DIAGNOSIS — C9100 Acute lymphoblastic leukemia not having achieved remission: Secondary | ICD-10-CM | POA: Insufficient documentation

## 2011-02-06 DIAGNOSIS — R0989 Other specified symptoms and signs involving the circulatory and respiratory systems: Secondary | ICD-10-CM | POA: Insufficient documentation

## 2011-02-06 LAB — CHCC SMEAR

## 2011-02-06 LAB — CBC WITH DIFFERENTIAL/PLATELET
BASO%: 0 % (ref 0.0–2.0)
EOS%: 1.2 % (ref 0.0–7.0)
MCH: 42.2 pg — ABNORMAL HIGH (ref 25.1–34.0)
MCHC: 32.7 g/dL (ref 31.5–36.0)
MONO%: 7.2 % (ref 0.0–14.0)
NEUT%: 74.5 % (ref 38.4–76.8)
RDW: 17.2 % — ABNORMAL HIGH (ref 11.2–14.5)
lymph#: 0.4 10*3/uL — ABNORMAL LOW (ref 0.9–3.3)

## 2011-02-06 LAB — HOLD TUBE, BLOOD BANK

## 2011-02-06 NOTE — Progress Notes (Signed)
Call to [facility] in flow cytometry: Added to schedule for bone marrow biopsy tomorrow at 0830. Requested routine with flow and cyto. Gave patient verbal and printed information on Bendamustine.

## 2011-02-06 NOTE — Progress Notes (Signed)
OFFICE PROGRESS NOTE   INTERVAL HISTORY:   She returns as scheduled. The cough has improved. She denies shortness of breath.  Objective:  Vital signs in last 24 hours:  Blood pressure 144/69, pulse 110, temperature 98.6 F (37 C), temperature source Oral, height 5' 4.5" (1.638 m), weight 146 lb (66.225 kg).   Resp: End inspiratory rhonchi at the left base. No respiratory distress Cardio: Regular rhythm with premature beats GI: No hepatosplenomegaly Vascular: No leg edema    Lab Results:  Lab Results  Component Value Date   WBC 2.5* 02/06/2011   HGB 6.5* 02/06/2011   HCT 19.9* 02/06/2011   MCV 129.2* 02/06/2011   PLT 80* 02/06/2011   ANC 1.9 Reticulocyte count on 01/28/2011-34, 2.37%   Medications: I have reviewed the patient's current medications.  Assessment/Plan: 1. Chronic lymphocytic leukemia, status post two cycles of fludarabine/rituximab with cycle #2 beginning on 05/08/2009. 2. Pancytopenia, status post a follow-up bone marrow biopsy, 09/01/2009, with involvement by CLL identified.  3. History of a prolonged bleeding time. A diagnostic evaluation including a von Willebrand panel and platelet function studies were negative. 4. Left breast ductal carcinoma in situ, status post a needle localized lumpectomy, 07/19/2008, followed by left breast radiation. She continues tamoxifen. 5. Chills during the Rituxan infusion, 04/05/2009. She was premedicated with Decadron at home prior to cycle #2. She tolerated cycle #2 without significant acute toxicity aside from upper chest and facial erythema. 6. Marked neutropenia on labs, 11/22 and 12/19/2009, likely a delayed nadir related to rituximab. Resolved. 7. Macrocytic anemia-progressive, likely due to bone marrow involvement by CLL. A laboratory evaluation on 01/14/2011 did not suggest hemolysis. A DAT was negative and the reticulocyte count was low on 01/28/2011.         8.  Upper respiratory infection-likely a viral URI. She  completed a course of Avelox. The cough persists. We will obtain a chest x-ray today.     Disposition:  She appears stable. She declines a red blood cell transfusion. She has persistent pancytopenia with severe anemia. There is no evidence of hemolysis. I suspect the severe anemia is related to bone marrow involvement by chronic lymphocytic leukemia. We scheduled a bone marrow biopsy for January 17 to confirm this. The plan is to proceed with salvage bendamustine chemotherapy if the bone marrow is involved with CLL.  I reviewed the potential toxicities associated with bendamustine including the chance of an allergic reaction, nausea, and hematologic toxicity. She will be scheduled for a first cycle of bendamustine on 02/12/2011.  She will return for an office visit and nadir CBC on 02/26/2011.   Lucile Shutters, MD  02/06/2011  11:49 AM

## 2011-02-06 NOTE — Progress Notes (Signed)
Addendum: Review the peripheral blood smear from 02/06/2011-the platelets are decreased in number and there are scattered large platelets. The majority of the white cells are mature neutrophils. There are numerous macrocytes and ovalocytes. The polychromasia appears increased. There are a moderate number of teardrop forms. No spherocytes or nucleated red cells.

## 2011-02-06 NOTE — Telephone Encounter (Signed)
appt made and printed for 2/5,ot aware to arrive 1/17 at 8:30 and will get appt times for 1/22 and 1/23 on 1/17,    aom

## 2011-02-07 ENCOUNTER — Other Ambulatory Visit: Payer: Self-pay | Admitting: Oncology

## 2011-02-07 ENCOUNTER — Ambulatory Visit (HOSPITAL_BASED_OUTPATIENT_CLINIC_OR_DEPARTMENT_OTHER): Payer: Medicare Other | Admitting: Oncology

## 2011-02-07 ENCOUNTER — Other Ambulatory Visit: Payer: Self-pay | Admitting: *Deleted

## 2011-02-07 ENCOUNTER — Other Ambulatory Visit (HOSPITAL_COMMUNITY)
Admission: RE | Admit: 2011-02-07 | Discharge: 2011-02-07 | Disposition: A | Discharge status: Home / Self Care | Payer: Medicare Other | Source: Ambulatory Visit | Attending: Oncology | Admitting: Oncology

## 2011-02-07 DIAGNOSIS — C9110 Chronic lymphocytic leukemia of B-cell type not having achieved remission: Secondary | ICD-10-CM

## 2011-02-07 DIAGNOSIS — R05 Cough: Secondary | ICD-10-CM

## 2011-02-07 DIAGNOSIS — D61818 Other pancytopenia: Secondary | ICD-10-CM | POA: Insufficient documentation

## 2011-02-07 MED ORDER — PROMETHAZINE-CODEINE 6.25-10 MG/5ML PO SYRP: 10.0000 mL | ORAL_SOLUTION | Freq: Two times a day (BID) | ORAL | Status: DC | PRN

## 2011-02-07 NOTE — Progress Notes (Signed)
Procedure note:  A left posterior iliac bone marrow aspirate and biopsy were performed after a sterile preparation. Approximately 7 cc of lidocaine were used as a local anesthetic. Bone marrow aspirate, bone marrow biopsy, and flow cytometry/cytogenetics samples were obtained. The patient tolerated procedure well. There were no apparent complications.

## 2011-02-07 NOTE — Patient Instructions (Signed)
Keep pressure dressing on for 24 hours. IF bleeding begins, apply pressure for 20 minutes. Call if bleeding does not stop. May take Advil or Tylenol for pain. Follow up as scheduled.

## 2011-02-07 NOTE — Progress Notes (Signed)
Reviewed procedure for bone marrow biopsy with patient and consent signed.(this is her 3rd bone marrow biopsy).  Reports no discomfort or change in condition since last visit. Cough persists and is requesting script. MD made aware. Procedure to be done by Dr. Mancel Bale with Lonna Cobb, NP assisting.  0930--Biopsy site left posteior  Iliac crest area pressure dressing is clean/dry and intact. Tolerated procedure well. Reviewed post care instructions as in past.

## 2011-02-11 ENCOUNTER — Telehealth: Payer: Self-pay | Admitting: Oncology

## 2011-02-11 ENCOUNTER — Telehealth: Payer: Self-pay | Admitting: *Deleted

## 2011-02-11 ENCOUNTER — Other Ambulatory Visit: Payer: Self-pay | Admitting: *Deleted

## 2011-02-11 ENCOUNTER — Encounter (HOSPITAL_COMMUNITY): Payer: Medicare Other

## 2011-02-11 DIAGNOSIS — D469 Myelodysplastic syndrome, unspecified: Secondary | ICD-10-CM

## 2011-02-11 NOTE — Telephone Encounter (Signed)
called pt and provided appts for 01/23 and 01/30

## 2011-02-11 NOTE — Telephone Encounter (Signed)
MD called patient to discuss results of bone marrow biopsy--shows myelodysplasia. Cancel chemo this week. Will check labs on 1/23 and 1/30. Keep office visit as scheduled for now. Called path dept and requested [facility] FISH panel be run on BM of 02/07/11 ([id number]) Schedulers notified.

## 2011-02-12 ENCOUNTER — Ambulatory Visit: Payer: Medicare Other

## 2011-02-13 ENCOUNTER — Ambulatory Visit: Payer: Medicare Other

## 2011-02-13 ENCOUNTER — Other Ambulatory Visit (HOSPITAL_BASED_OUTPATIENT_CLINIC_OR_DEPARTMENT_OTHER): Payer: Medicare Other

## 2011-02-13 ENCOUNTER — Telehealth: Payer: Self-pay | Admitting: *Deleted

## 2011-02-13 DIAGNOSIS — D469 Myelodysplastic syndrome, unspecified: Secondary | ICD-10-CM

## 2011-02-13 LAB — CBC WITH DIFFERENTIAL/PLATELET
Eosinophils Absolute: 0 10*3/uL (ref 0.0–0.5)
HCT: 19.2 % — ABNORMAL LOW (ref 34.8–46.6)
LYMPH%: 16.2 % (ref 14.0–49.7)
MCV: 130.7 fL — ABNORMAL HIGH (ref 79.5–101.0)
MONO#: 0.1 10*3/uL (ref 0.1–0.9)
MONO%: 5.5 % (ref 0.0–14.0)
NEUT#: 1.5 10*3/uL (ref 1.5–6.5)
NEUT%: 76.9 % — ABNORMAL HIGH (ref 38.4–76.8)
Platelets: 55 10*3/uL — ABNORMAL LOW (ref 145–400)
WBC: 1.9 10*3/uL — ABNORMAL LOW (ref 3.9–10.3)

## 2011-02-13 NOTE — Telephone Encounter (Signed)
Left message on voicemail for pt to call office re: lab results.  

## 2011-02-15 ENCOUNTER — Encounter (HOSPITAL_COMMUNITY): Payer: Medicare Other

## 2011-02-15 NOTE — Telephone Encounter (Signed)
Spoke with pt, she denies need for blood transfusion. Understands to call office or go to ED for dyspnea/ increased weakness, chest pain or bleeding.

## 2011-02-20 ENCOUNTER — Other Ambulatory Visit (HOSPITAL_BASED_OUTPATIENT_CLINIC_OR_DEPARTMENT_OTHER): Payer: Medicare Other | Admitting: Lab

## 2011-02-20 ENCOUNTER — Telehealth: Payer: Self-pay | Admitting: *Deleted

## 2011-02-20 ENCOUNTER — Other Ambulatory Visit: Payer: Self-pay | Admitting: Nurse Practitioner

## 2011-02-20 ENCOUNTER — Encounter (HOSPITAL_COMMUNITY)
Admission: RE | Admit: 2011-02-20 | Discharge: 2011-02-20 | Disposition: A | Discharge status: Home / Self Care | Payer: Medicare Other | Source: Ambulatory Visit | Attending: Oncology | Admitting: Oncology

## 2011-02-20 DIAGNOSIS — D649 Anemia, unspecified: Secondary | ICD-10-CM

## 2011-02-20 DIAGNOSIS — D630 Anemia in neoplastic disease: Secondary | ICD-10-CM

## 2011-02-20 DIAGNOSIS — D469 Myelodysplastic syndrome, unspecified: Secondary | ICD-10-CM

## 2011-02-20 LAB — CBC WITH DIFFERENTIAL/PLATELET
Basophils Absolute: 0 10*3/uL (ref 0.0–0.1)
Eosinophils Absolute: 0 10*3/uL (ref 0.0–0.5)
HCT: 18.3 % — ABNORMAL LOW (ref 34.8–46.6)
HGB: 6.1 g/dL — CL (ref 11.6–15.9)
LYMPH%: 13.2 % — ABNORMAL LOW (ref 14.0–49.7)
MCV: 126.2 fL — ABNORMAL HIGH (ref 79.5–101.0)
MONO#: 0.1 10*3/uL (ref 0.1–0.9)
NEUT#: 2.2 10*3/uL (ref 1.5–6.5)
NEUT%: 81.1 % — ABNORMAL HIGH (ref 38.4–76.8)
Platelets: 80 10*3/uL — ABNORMAL LOW (ref 145–400)
RBC: 1.45 10*6/uL — ABNORMAL LOW (ref 3.70–5.45)
WBC: 2.7 10*3/uL — ABNORMAL LOW (ref 3.9–10.3)
nRBC: 0 % (ref 0–0)

## 2011-02-20 LAB — HOLD TUBE, BLOOD BANK

## 2011-02-20 NOTE — Telephone Encounter (Signed)
Patient notified transfusion will be done on 2/1 at 0900. Keep her blood bank bracelet on.

## 2011-02-22 ENCOUNTER — Ambulatory Visit (HOSPITAL_BASED_OUTPATIENT_CLINIC_OR_DEPARTMENT_OTHER): Payer: Medicare Other

## 2011-02-22 ENCOUNTER — Encounter (HOSPITAL_COMMUNITY)
Admission: RE | Admit: 2011-02-22 | Discharge: 2011-02-22 | Disposition: A | Discharge status: Home / Self Care | Payer: Medicare Other | Source: Ambulatory Visit | Attending: Oncology | Admitting: Oncology

## 2011-02-22 VITALS — BP 114/68 | HR 97 | Temp 98.9°F | Resp 20

## 2011-02-22 DIAGNOSIS — D649 Anemia, unspecified: Secondary | ICD-10-CM | POA: Insufficient documentation

## 2011-02-23 LAB — TYPE AND SCREEN
ABO/RH(D): O POS
Unit division: 0

## 2011-02-25 ENCOUNTER — Telehealth: Payer: Self-pay | Admitting: Oncology

## 2011-02-25 ENCOUNTER — Ambulatory Visit (HOSPITAL_BASED_OUTPATIENT_CLINIC_OR_DEPARTMENT_OTHER): Payer: Medicare Other | Admitting: Nurse Practitioner

## 2011-02-25 VITALS — BP 152/73 | HR 93 | Temp 97.9°F | Ht 64.5 in | Wt 148.7 lb

## 2011-02-25 DIAGNOSIS — C50919 Malignant neoplasm of unspecified site of unspecified female breast: Secondary | ICD-10-CM

## 2011-02-25 DIAGNOSIS — D539 Nutritional anemia, unspecified: Secondary | ICD-10-CM

## 2011-02-25 DIAGNOSIS — Z79810 Long term (current) use of selective estrogen receptor modulators (SERMs): Secondary | ICD-10-CM

## 2011-02-25 DIAGNOSIS — D469 Myelodysplastic syndrome, unspecified: Secondary | ICD-10-CM

## 2011-02-25 DIAGNOSIS — C9110 Chronic lymphocytic leukemia of B-cell type not having achieved remission: Secondary | ICD-10-CM

## 2011-02-25 MED ORDER — EPOETIN ALFA 20000 UNIT/ML IJ SOLN
60000.0000 [IU] | Freq: Once | INTRAMUSCULAR | Status: AC
  Administered 2011-02-25: 60000 [IU] via SUBCUTANEOUS
  Filled 2011-02-25: qty 3

## 2011-02-25 NOTE — Telephone Encounter (Signed)
gv pt appt schedule for feb °

## 2011-02-25 NOTE — Progress Notes (Signed)
Provided patient with verbal/printed information on Procrit

## 2011-02-25 NOTE — Progress Notes (Signed)
OFFICE PROGRESS NOTE  Interval history:  Anne Doyle returns as scheduled. She underwent a bone marrow aspiration/biopsy by Dr. Truett Perna on 02/07/2011. Findings included a hypercellular bone marrow for age with dyspoietic changes. There were a few very small lymphoid aggregates present. Iron stores were increased. Flow cytometry failed to show any monoclonal B-cell population or abnormal T cell phenotype. The immunohistochemical stains failed to show any significant B-cell component. Cytogenetic analysis is pending.  Anne Doyle was transfused 2 units of blood 02/22/2011. She noted significant improvement in her energy level. She denies shortness of breath or chest pain. No fevers or sweats.  Objective: Blood pressure 152/73, pulse 93, temperature 97.9 F (36.6 C), temperature source Oral, height 5' 4.5" (1.638 m), weight 148 lb 11.2 oz (67.45 kg).  Oropharynx is without thrush or ulceration. Inspiratory rales at the left lung base. Regular cardiac rhythm. Abdomen soft and nontender. No organomegaly. Extremities are without edema.  Lab Results: Lab Results  Component Value Date   WBC 2.7* 02/20/2011   HGB 6.1* 02/20/2011   HCT 18.3* 02/20/2011   MCV 126.2* 02/20/2011   PLT 80* 02/20/2011    Chemistry:    Chemistry      Component Value Date/Time   NA 141 01/14/2011 0928   K 4.0 01/14/2011 0928   CL 105 01/14/2011 0928   CO2 28 01/14/2011 0928   BUN 12 01/14/2011 0928   CREATININE 0.78 01/14/2011 0928      Component Value Date/Time   CALCIUM 9.1 01/14/2011 0928   ALKPHOS 48 01/14/2011 0928   AST 17 01/14/2011 0928   ALT 11 01/14/2011 0928   BILITOT 1.2 01/14/2011 0928       Studies/Results: Dg Chest 2 View  02/06/2011  *RADIOLOGY REPORT*  Clinical Data: Congestion and cough, CLL  CHEST - 2 VIEW  Comparison: None.  Findings: The cardiac silhouette, mediastinum, pulmonary vasculature are within normal limits.  Both lungs are clear.  There is methylmethacrylate within a  collapsed mid thoracic vertebral body. There is no acute bony abnormality.  IMPRESSION: There is no evidence of acute cardiac or pulmonary process.  Original Report Authenticated By: Brandon Melnick, M.D.    Medications: I have reviewed the patient's current medications.  Assessment/Plan:  1. Chronic lymphocytic leukemia, status post two cycles of fludarabine/rituximab with cycle #2 beginning on 05/08/2009. 2. Pancytopenia, status post a follow-up bone marrow biopsy, 09/01/2009, with involvement by CLL identified.  3. History of a prolonged bleeding time. A diagnostic evaluation including a von Willebrand panel and platelet function studies were negative. 4. Left breast ductal carcinoma in situ, status post a needle localized lumpectomy, 07/19/2008, followed by left breast radiation. She continues tamoxifen. 5. Chills during the Rituxan infusion, 04/05/2009. She was premedicated with Decadron at home prior to cycle #2. She tolerated cycle #2 without significant acute toxicity aside from upper chest and facial erythema. 6. Marked neutropenia on labs, 11/22 and 12/19/2009, likely a delayed nadir related to rituximab. Resolved. 7. Macrocytic anemia-progressive.  A laboratory evaluation on 01/14/2011 did not suggest hemolysis. A DAT was negative and the reticulocyte count was low on 01/28/2011. Bone marrow aspiration/biopsy 02/07/2011 showed a hypercellular marrow for age with dyspoietic changes primarily involving the erythroid and megakaryocytic cell lines. There was no morphologic or immunophenotypic evidence of a B-cell lymphoproliferative process. Cytogenetic analysis is pending.  Disposition-the bone marrow findings are concerning for myelodysplasia. We are waiting the cytogenetic analysis. Dr. Truett Perna recommends beginning a trial of erythropoietin 60,000 units weekly. Dr. Truett Perna reviewed potential  adverse reactions including hypertension, blood clots, stimulation of tumor growth. She was also  provided with written information. She will begin ferrous sulfate 325 mg twice daily. She will return for a followup visit in 3 weeks. She will contact the office the interim with any problems.  Patient seen with Dr. Truett Perna.      Lonna Cobb ANP/GNP-BC

## 2011-02-26 ENCOUNTER — Ambulatory Visit: Payer: Medicare Other | Admitting: Nurse Practitioner

## 2011-03-04 ENCOUNTER — Other Ambulatory Visit: Payer: Medicare Other | Admitting: Lab

## 2011-03-04 ENCOUNTER — Ambulatory Visit (HOSPITAL_BASED_OUTPATIENT_CLINIC_OR_DEPARTMENT_OTHER): Payer: Medicare Other

## 2011-03-04 VITALS — BP 151/88 | HR 94 | Temp 98.6°F

## 2011-03-04 DIAGNOSIS — D469 Myelodysplastic syndrome, unspecified: Secondary | ICD-10-CM

## 2011-03-04 LAB — CBC WITH DIFFERENTIAL/PLATELET
BASO%: 0 % (ref 0.0–2.0)
EOS%: 0.4 % (ref 0.0–7.0)
MCH: 36.8 pg — ABNORMAL HIGH (ref 25.1–34.0)
MCHC: 33.3 g/dL (ref 31.5–36.0)
RDW: 21.1 % — ABNORMAL HIGH (ref 11.2–14.5)
lymph#: 0.4 10*3/uL — ABNORMAL LOW (ref 0.9–3.3)

## 2011-03-04 MED ORDER — EPOETIN ALFA 20000 UNIT/ML IJ SOLN
60000.0000 [IU] | Freq: Once | INTRAMUSCULAR | Status: AC
  Administered 2011-03-04: 60000 [IU] via SUBCUTANEOUS
  Filled 2011-03-04: qty 3

## 2011-03-11 ENCOUNTER — Ambulatory Visit (HOSPITAL_BASED_OUTPATIENT_CLINIC_OR_DEPARTMENT_OTHER): Payer: Medicare Other

## 2011-03-11 ENCOUNTER — Other Ambulatory Visit: Payer: Medicare Other | Admitting: Lab

## 2011-03-11 VITALS — BP 135/84 | HR 96 | Temp 97.3°F

## 2011-03-11 DIAGNOSIS — D469 Myelodysplastic syndrome, unspecified: Secondary | ICD-10-CM

## 2011-03-11 LAB — CBC WITH DIFFERENTIAL/PLATELET
BASO%: 0.2 % (ref 0.0–2.0)
EOS%: 0.8 % (ref 0.0–7.0)
Eosinophils Absolute: 0 10*3/uL (ref 0.0–0.5)
LYMPH%: 15.4 % (ref 14.0–49.7)
MCHC: 35.1 g/dL (ref 31.5–36.0)
MCV: 112.6 fL — ABNORMAL HIGH (ref 79.5–101.0)
MONO%: 5.9 % (ref 0.0–14.0)
NEUT#: 1.6 10*3/uL (ref 1.5–6.5)
Platelets: 69 10*3/uL — ABNORMAL LOW (ref 145–400)
RBC: 2.04 10*6/uL — ABNORMAL LOW (ref 3.70–5.45)
RDW: 25.7 % — ABNORMAL HIGH (ref 11.2–14.5)

## 2011-03-11 MED ORDER — EPOETIN ALFA 20000 UNIT/ML IJ SOLN
60000.0000 [IU] | Freq: Once | INTRAMUSCULAR | Status: AC
  Administered 2011-03-11: 60000 [IU] via SUBCUTANEOUS
  Filled 2011-03-11: qty 3

## 2011-03-18 ENCOUNTER — Other Ambulatory Visit (HOSPITAL_BASED_OUTPATIENT_CLINIC_OR_DEPARTMENT_OTHER): Payer: Medicare Other | Admitting: Lab

## 2011-03-18 ENCOUNTER — Telehealth: Payer: Self-pay | Admitting: Oncology

## 2011-03-18 ENCOUNTER — Ambulatory Visit (HOSPITAL_BASED_OUTPATIENT_CLINIC_OR_DEPARTMENT_OTHER): Payer: Medicare Other | Admitting: Oncology

## 2011-03-18 VITALS — BP 168/78 | HR 106 | Temp 97.4°F | Ht 64.5 in | Wt 146.6 lb

## 2011-03-18 DIAGNOSIS — D469 Myelodysplastic syndrome, unspecified: Secondary | ICD-10-CM

## 2011-03-18 LAB — CBC WITH DIFFERENTIAL/PLATELET
BASO%: 0.4 % (ref 0.0–2.0)
Eosinophils Absolute: 0 10*3/uL (ref 0.0–0.5)
LYMPH%: 17.2 % (ref 14.0–49.7)
MCHC: 33.8 g/dL (ref 31.5–36.0)
MONO#: 0.2 10*3/uL (ref 0.1–0.9)
NEUT#: 1.7 10*3/uL (ref 1.5–6.5)
RBC: 1.96 10*6/uL — ABNORMAL LOW (ref 3.70–5.45)
RDW: 22.7 % — ABNORMAL HIGH (ref 11.2–14.5)
WBC: 2.3 10*3/uL — ABNORMAL LOW (ref 3.9–10.3)
lymph#: 0.4 10*3/uL — ABNORMAL LOW (ref 0.9–3.3)
nRBC: 0 % (ref 0–0)

## 2011-03-18 MED ORDER — EPOETIN ALFA 40000 UNIT/ML IJ SOLN
60000.0000 [IU] | Freq: Once | INTRAMUSCULAR | Status: AC
  Administered 2011-03-18: 60000 [IU] via SUBCUTANEOUS
  Filled 2011-03-18: qty 1

## 2011-03-18 NOTE — Progress Notes (Signed)
OFFICE PROGRESS NOTE  INTERVAL HISTORY:   She returns as scheduled. She has been maintained on weekly Procrit since the [place] fourth 2013. She reports tolerating the Procrit well. She has no new complaint. He does not feel in need of a Red cell transfusion today. The cytogenetics from the January 2013 bone marrow biopsy returned normal. A fish panel for chromosome 5 and chromosome 7 deletions was negative.  Objective:  Vital signs in last 24 hours:  Blood pressure 168/78, pulse 106, temperature 97.4 F (36.3 C), temperature source Oral, height 5' 4.5" (1.638 m), weight 146 lb 9.6 oz (66.497 kg).    HEENT: No thrush or ulcers Lymphatics: No cervical, supraclavicular, axillary, or inguinal nodes Resp: Lungs clear bilaterally Cardio: Regular rate and rhythm GI: No hepatosplenomegaly Vascular: No leg edema    Lab Results:  Lab Results  Component Value Date   WBC 2.3* 03/18/2011   HGB 7.3* 03/18/2011   HCT 21.6* 03/18/2011   MCV 110.2* 03/18/2011   PLT 82* 03/18/2011   ANC 1.7   Medications: I have reviewed the patient's current medications.  Assessment/Plan: 1. Chronic lymphocytic leukemia, status post two cycles of fludarabine/rituximab with cycle #2 beginning on 05/08/2009. 2. Pancytopenia, status post a follow-up bone marrow biopsy, 09/01/2009, with involvement by CLL identified.  3. History of a prolonged bleeding time. A diagnostic evaluation including a von Willebrand panel and platelet function studies were negative. 4. Left breast ductal carcinoma in situ, status post a needle localized lumpectomy, 07/19/2008, followed by left breast radiation. She continues tamoxifen. 5. Chills during the Rituxan infusion, 04/05/2009. She was premedicated with Decadron at home prior to cycle #2. She tolerated cycle #2 without significant acute toxicity aside from upper chest and facial erythema. 6. Marked neutropenia on labs, 11/22 and 12/19/2009, likely a delayed nadir related to  rituximab. Resolved. 7. Macrocytic anemia-progressive. A laboratory evaluation on 01/14/2011 did not suggest hemolysis. A DAT was negative and the reticulocyte count was low on 01/28/2011. Bone marrow aspiration/biopsy 02/07/2011 showed a hypercellular marrow for age with dyspoietic changes primarily involving the erythroid and megakaryocytic cell lines. There was no morphologic or immunophenotypic evidence of a B-cell lymphoproliferative process. Cytogenetics returned normal. She began a trial of weekly Procrit on 02/25/2011  Disposition:  She appears stable. She has been maintained on Procrit for the past 3 weeks and the hemoglobin is lower today. She does not have symptoms related to the severe anemia at present. We decided to continue weekly Procrit for now. We will arrange for a red cell transfusion as needed.  We decided to make a referral to the leukemia service at [facility] for treatment recommendations. She may be a candidate for Vidaza, decitabine,or a clinical trial.   Lucile Shutters, MD  03/18/2011  10:36 AM

## 2011-03-18 NOTE — Telephone Encounter (Signed)
Pt being referred to Dr. Miachel Roux @ [facility], filled out referral form and took to HIM

## 2011-03-25 ENCOUNTER — Other Ambulatory Visit: Payer: Self-pay | Admitting: Nurse Practitioner

## 2011-03-25 ENCOUNTER — Ambulatory Visit (HOSPITAL_BASED_OUTPATIENT_CLINIC_OR_DEPARTMENT_OTHER): Payer: Medicare Other

## 2011-03-25 ENCOUNTER — Encounter (HOSPITAL_COMMUNITY)
Admission: RE | Admit: 2011-03-25 | Discharge: 2011-03-25 | Disposition: A | Discharge status: Home / Self Care | Payer: Medicare Other | Source: Ambulatory Visit | Attending: Oncology | Admitting: Oncology

## 2011-03-25 ENCOUNTER — Ambulatory Visit: Payer: Medicare Other

## 2011-03-25 ENCOUNTER — Other Ambulatory Visit: Payer: Medicare Other | Admitting: Lab

## 2011-03-25 DIAGNOSIS — D469 Myelodysplastic syndrome, unspecified: Secondary | ICD-10-CM

## 2011-03-25 DIAGNOSIS — D649 Anemia, unspecified: Secondary | ICD-10-CM

## 2011-03-25 LAB — CBC WITH DIFFERENTIAL/PLATELET
BASO%: 0 % (ref 0.0–2.0)
Eosinophils Absolute: 0 10*3/uL (ref 0.0–0.5)
MCH: 37.3 pg — ABNORMAL HIGH (ref 25.1–34.0)
MCHC: 33.7 g/dL (ref 31.5–36.0)
MONO#: 0.1 10*3/uL (ref 0.1–0.9)
MONO%: 3.4 % (ref 0.0–14.0)
Platelets: 96 10*3/uL — ABNORMAL LOW (ref 145–400)
RBC: 1.66 10*6/uL — ABNORMAL LOW (ref 3.70–5.45)
WBC: 3.8 10*3/uL — ABNORMAL LOW (ref 3.9–10.3)
nRBC: 0 % (ref 0–0)

## 2011-03-25 LAB — PREPARE RBC (CROSSMATCH)

## 2011-03-25 MED ORDER — SODIUM CHLORIDE 0.9 % IV SOLN
250.0000 mL | Freq: Once | INTRAVENOUS | Status: AC
  Administered 2011-03-25: 250 mL via INTRAVENOUS

## 2011-03-25 MED ORDER — EPOETIN ALFA 40000 UNIT/ML IJ SOLN
60000.0000 [IU] | Freq: Once | INTRAMUSCULAR | Status: AC
  Administered 2011-03-25: 60000 [IU] via SUBCUTANEOUS
  Filled 2011-03-25: qty 2

## 2011-03-26 LAB — TYPE AND SCREEN
ABO/RH(D): O POS
Antibody Screen: NEGATIVE
Unit division: 0
Unit division: 0

## 2011-03-26 LAB — HOLD TUBE, BLOOD BANK

## 2011-04-01 ENCOUNTER — Other Ambulatory Visit (HOSPITAL_BASED_OUTPATIENT_CLINIC_OR_DEPARTMENT_OTHER): Payer: Medicare Other | Admitting: Lab

## 2011-04-01 ENCOUNTER — Ambulatory Visit (HOSPITAL_BASED_OUTPATIENT_CLINIC_OR_DEPARTMENT_OTHER): Payer: Medicare Other

## 2011-04-01 DIAGNOSIS — D649 Anemia, unspecified: Secondary | ICD-10-CM

## 2011-04-01 DIAGNOSIS — D469 Myelodysplastic syndrome, unspecified: Secondary | ICD-10-CM

## 2011-04-01 LAB — CBC WITH DIFFERENTIAL/PLATELET
BASO%: 0 % (ref 0.0–2.0)
HCT: 25.6 % — ABNORMAL LOW (ref 34.8–46.6)
HGB: 8.5 g/dL — ABNORMAL LOW (ref 11.6–15.9)
MCHC: 33.2 g/dL (ref 31.5–36.0)
MONO#: 0.1 10*3/uL (ref 0.1–0.9)
NEUT#: 1.9 10*3/uL (ref 1.5–6.5)
NEUT%: 78.4 % — ABNORMAL HIGH (ref 38.4–76.8)
WBC: 2.4 10*3/uL — ABNORMAL LOW (ref 3.9–10.3)
lymph#: 0.4 10*3/uL — ABNORMAL LOW (ref 0.9–3.3)

## 2011-04-01 MED ORDER — EPOETIN ALFA 40000 UNIT/ML IJ SOLN
Freq: Once | INTRAMUSCULAR | Status: AC
  Administered 2011-04-01: 10:00:00 via SUBCUTANEOUS
  Filled 2011-04-01: qty 1

## 2011-04-08 ENCOUNTER — Ambulatory Visit (HOSPITAL_BASED_OUTPATIENT_CLINIC_OR_DEPARTMENT_OTHER): Payer: Medicare Other

## 2011-04-08 ENCOUNTER — Other Ambulatory Visit (HOSPITAL_BASED_OUTPATIENT_CLINIC_OR_DEPARTMENT_OTHER): Payer: Medicare Other | Admitting: Lab

## 2011-04-08 VITALS — BP 153/73 | HR 102 | Temp 97.3°F

## 2011-04-08 DIAGNOSIS — D649 Anemia, unspecified: Secondary | ICD-10-CM

## 2011-04-08 DIAGNOSIS — D469 Myelodysplastic syndrome, unspecified: Secondary | ICD-10-CM

## 2011-04-08 LAB — CBC WITH DIFFERENTIAL/PLATELET
BASO%: 0 % (ref 0.0–2.0)
Basophils Absolute: 0 10*3/uL (ref 0.0–0.1)
EOS%: 0.5 % (ref 0.0–7.0)
Eosinophils Absolute: 0 10*3/uL (ref 0.0–0.5)
HCT: 22.3 % — ABNORMAL LOW (ref 34.8–46.6)
HGB: 7.6 g/dL — ABNORMAL LOW (ref 11.6–15.9)
LYMPH%: 21.1 % (ref 14.0–49.7)
MCH: 34.9 pg — ABNORMAL HIGH (ref 25.1–34.0)
MCHC: 34.1 g/dL (ref 31.5–36.0)
MCV: 102.3 fL — ABNORMAL HIGH (ref 79.5–101.0)
MONO#: 0.1 10*3/uL (ref 0.1–0.9)
MONO%: 4.5 % (ref 0.0–14.0)
NEUT#: 1.5 10*3/uL (ref 1.5–6.5)
NEUT%: 73.9 % (ref 38.4–76.8)
Platelets: 83 10*3/uL — ABNORMAL LOW (ref 145–400)
RBC: 2.18 10*6/uL — ABNORMAL LOW (ref 3.70–5.45)
RDW: 22 % — ABNORMAL HIGH (ref 11.2–14.5)
WBC: 2 10*3/uL — ABNORMAL LOW (ref 3.9–10.3)
lymph#: 0.4 10*3/uL — ABNORMAL LOW (ref 0.9–3.3)

## 2011-04-08 LAB — HOLD TUBE, BLOOD BANK

## 2011-04-08 MED ORDER — EPOETIN ALFA 20000 UNIT/ML IJ SOLN
60000.0000 [IU] | Freq: Once | INTRAMUSCULAR | Status: AC
  Administered 2011-04-08: 60000 [IU] via SUBCUTANEOUS

## 2011-04-15 ENCOUNTER — Ambulatory Visit (HOSPITAL_BASED_OUTPATIENT_CLINIC_OR_DEPARTMENT_OTHER): Payer: Medicare Other | Admitting: Nurse Practitioner

## 2011-04-15 ENCOUNTER — Telehealth: Payer: Self-pay | Admitting: Oncology

## 2011-04-15 ENCOUNTER — Other Ambulatory Visit: Payer: Medicare Other | Admitting: Lab

## 2011-04-15 VITALS — BP 150/70 | HR 98 | Temp 97.1°F | Ht 64.5 in | Wt 143.7 lb

## 2011-04-15 DIAGNOSIS — D469 Myelodysplastic syndrome, unspecified: Secondary | ICD-10-CM

## 2011-04-15 DIAGNOSIS — D649 Anemia, unspecified: Secondary | ICD-10-CM

## 2011-04-15 LAB — SAMPLE TO BLOOD BANK

## 2011-04-15 LAB — CBC WITH DIFFERENTIAL/PLATELET
Basophils Absolute: 0 10*3/uL (ref 0.0–0.1)
Eosinophils Absolute: 0 10*3/uL (ref 0.0–0.5)
HCT: 19.7 % — ABNORMAL LOW (ref 34.8–46.6)
HGB: 6.6 g/dL — CL (ref 11.6–15.9)
MCV: 102.6 fL — ABNORMAL HIGH (ref 79.5–101.0)
MONO%: 5.3 % (ref 0.0–14.0)
NEUT#: 1.5 10*3/uL (ref 1.5–6.5)
NEUT%: 76.7 % (ref 38.4–76.8)
Platelets: 77 10*3/uL — ABNORMAL LOW (ref 145–400)
RDW: 23.2 % — ABNORMAL HIGH (ref 11.2–14.5)

## 2011-04-15 LAB — HOLD TUBE, BLOOD BANK

## 2011-04-15 MED ORDER — EPOETIN ALFA 20000 UNIT/ML IJ SOLN
60000.0000 [IU] | Freq: Once | INTRAMUSCULAR | Status: AC
  Administered 2011-04-15: 60000 [IU] via SUBCUTANEOUS
  Filled 2011-04-15: qty 3

## 2011-04-15 NOTE — Telephone Encounter (Signed)
appts made and printed for pt aom °

## 2011-04-15 NOTE — Progress Notes (Signed)
OFFICE PROGRESS NOTE  Interval history:  Ms. Hettich returns as scheduled. She continues weekly Procrit injections. She was last transfused 2 units of blood on 03/25/2011. She feels well overall. She denies shortness of breath. No chest pain. She denies bleeding. Last week her energy level was good. She felt somewhat "weak" this morning.   Objective: Blood pressure 150/70, pulse 98, temperature 97.1 F (36.2 C), temperature source Oral, height 5' 4.5" (1.638 m), weight 143 lb 11.2 oz (65.182 kg).  Oropharynx is without thrush or ulceration. Lungs are clear. Regular cardiac rhythm. Abdomen is soft and nontender. No organomegaly. Extremities are without edema.  Lab Results: Lab Results  Component Value Date   WBC 1.9* 04/15/2011   HGB 6.6* 04/15/2011   HCT 19.7* 04/15/2011   MCV 102.6* 04/15/2011   PLT 77* 04/15/2011    Chemistry:    Chemistry      Component Value Date/Time   NA 141 01/14/2011 0928   K 4.0 01/14/2011 0928   CL 105 01/14/2011 0928   CO2 28 01/14/2011 0928   BUN 12 01/14/2011 0928   CREATININE 0.78 01/14/2011 0928      Component Value Date/Time   CALCIUM 9.1 01/14/2011 0928   ALKPHOS 48 01/14/2011 0928   AST 17 01/14/2011 0928   ALT 11 01/14/2011 0928   BILITOT 1.2 01/14/2011 0928       Studies/Results: No results found.  Medications: I have reviewed the patient's current medications.  Assessment/Plan:  1. Chronic lymphocytic leukemia, status post two cycles of fludarabine/rituximab with cycle #2 beginning on 05/08/2009. 2. Pancytopenia, status post a follow-up bone marrow biopsy, 09/01/2009, with involvement by CLL identified.  3. History of a prolonged bleeding time. A diagnostic evaluation including a von Willebrand panel and platelet function studies were negative. 4. Left breast ductal carcinoma in situ, status post a needle localized lumpectomy, 07/19/2008, followed by left breast radiation. She continues tamoxifen. 5. Chills during the Rituxan  infusion, 04/05/2009. She was premedicated with Decadron at home prior to cycle #2. She tolerated cycle #2 without significant acute toxicity aside from upper chest and facial erythema. 6. Marked neutropenia on labs, 11/22 and 12/19/2009, likely a delayed nadir related to rituximab. Resolved. 7. Macrocytic anemia-progressive. A laboratory evaluation on 01/14/2011 did not suggest hemolysis. A DAT was negative and the reticulocyte count was low on 01/28/2011. Bone marrow aspiration/biopsy 02/07/2011 showed a hypercellular marrow for age with dyspoietic changes primarily involving the erythroid and megakaryocytic cell lines. There was no morphologic or immunophenotypic evidence of a B-cell lymphoproliferative process. Cytogenetics returned normal. She began a trial of weekly Procrit on 02/25/2011. She was last transfused on 03/25/2011.  Disposition-Ms. Alegria appears stable. She has now had 6 weekly Procrit injections. She was last transfused 2 units of packed red blood cells on 03/25/2011. Her hemoglobin is lower today. We are scheduling her for a transfusion on 04/18/2011. Dr. Truett Perna recommends continuation of the weekly Procrit injections. She recently saw Dr. Lowell Guitar at [facility]. We will obtain the office note from that visit. She will return for a followup visit with Dr. Truett Perna in 3 weeks. She will contact the office in the interim with any problems.  Patient seen with Dr. Truett Perna.    Lonna Cobb ANP/GNP-BC

## 2011-04-16 ENCOUNTER — Other Ambulatory Visit: Payer: Self-pay | Admitting: *Deleted

## 2011-04-16 ENCOUNTER — Other Ambulatory Visit: Payer: Self-pay | Admitting: Oncology

## 2011-04-16 DIAGNOSIS — D649 Anemia, unspecified: Secondary | ICD-10-CM

## 2011-04-16 LAB — PREPARE RBC (CROSSMATCH)

## 2011-04-18 ENCOUNTER — Ambulatory Visit (HOSPITAL_BASED_OUTPATIENT_CLINIC_OR_DEPARTMENT_OTHER): Payer: Medicare Other

## 2011-04-18 VITALS — BP 105/59 | HR 89 | Temp 97.3°F | Resp 16

## 2011-04-18 DIAGNOSIS — C9110 Chronic lymphocytic leukemia of B-cell type not having achieved remission: Secondary | ICD-10-CM

## 2011-04-18 DIAGNOSIS — D539 Nutritional anemia, unspecified: Secondary | ICD-10-CM

## 2011-04-18 DIAGNOSIS — D649 Anemia, unspecified: Secondary | ICD-10-CM

## 2011-04-19 LAB — TYPE AND SCREEN: Unit division: 0

## 2011-04-22 ENCOUNTER — Other Ambulatory Visit: Payer: Self-pay | Admitting: Nurse Practitioner

## 2011-04-22 ENCOUNTER — Other Ambulatory Visit: Payer: Medicare Other | Admitting: Lab

## 2011-04-22 ENCOUNTER — Ambulatory Visit (HOSPITAL_BASED_OUTPATIENT_CLINIC_OR_DEPARTMENT_OTHER): Payer: Medicare Other

## 2011-04-22 VITALS — BP 141/77 | HR 86 | Temp 97.5°F

## 2011-04-22 DIAGNOSIS — D469 Myelodysplastic syndrome, unspecified: Secondary | ICD-10-CM

## 2011-04-22 DIAGNOSIS — D649 Anemia, unspecified: Secondary | ICD-10-CM

## 2011-04-22 LAB — CBC WITH DIFFERENTIAL/PLATELET
Basophils Absolute: 0 10*3/uL (ref 0.0–0.1)
Eosinophils Absolute: 0 10*3/uL (ref 0.0–0.5)
HCT: 28.5 % — ABNORMAL LOW (ref 34.8–46.6)
LYMPH%: 16.3 % (ref 14.0–49.7)
MCHC: 34.4 g/dL (ref 31.5–36.0)
MONO#: 0.1 10*3/uL (ref 0.1–0.9)
NEUT#: 1.2 10*3/uL — ABNORMAL LOW (ref 1.5–6.5)
NEUT%: 77.2 % — ABNORMAL HIGH (ref 38.4–76.8)
Platelets: 60 10*3/uL — ABNORMAL LOW (ref 145–400)
WBC: 1.5 10*3/uL — ABNORMAL LOW (ref 3.9–10.3)

## 2011-04-22 MED ORDER — EPOETIN ALFA 40000 UNIT/ML IJ SOLN
60000.0000 [IU] | INTRAMUSCULAR | Status: DC
  Administered 2011-04-22: 60000 [IU] via SUBCUTANEOUS
  Filled 2011-04-22: qty 2

## 2011-04-29 ENCOUNTER — Ambulatory Visit (HOSPITAL_BASED_OUTPATIENT_CLINIC_OR_DEPARTMENT_OTHER): Payer: Medicare Other

## 2011-04-29 ENCOUNTER — Other Ambulatory Visit (HOSPITAL_BASED_OUTPATIENT_CLINIC_OR_DEPARTMENT_OTHER): Payer: Medicare Other | Admitting: Lab

## 2011-04-29 VITALS — BP 158/66 | HR 103 | Temp 97.4°F

## 2011-04-29 DIAGNOSIS — D469 Myelodysplastic syndrome, unspecified: Secondary | ICD-10-CM

## 2011-04-29 DIAGNOSIS — D649 Anemia, unspecified: Secondary | ICD-10-CM

## 2011-04-29 LAB — CBC WITH DIFFERENTIAL/PLATELET
Basophils Absolute: 0 10*3/uL (ref 0.0–0.1)
Eosinophils Absolute: 0 10*3/uL (ref 0.0–0.5)
HCT: 26.6 % — ABNORMAL LOW (ref 34.8–46.6)
HGB: 9.1 g/dL — ABNORMAL LOW (ref 11.6–15.9)
LYMPH%: 14.1 % (ref 14.0–49.7)
MCV: 100.9 fL (ref 79.5–101.0)
MONO%: 6.6 % (ref 0.0–14.0)
NEUT#: 1.5 10*3/uL (ref 1.5–6.5)
Platelets: 71 10*3/uL — ABNORMAL LOW (ref 145–400)

## 2011-04-29 MED ORDER — EPOETIN ALFA 40000 UNIT/ML IJ SOLN
60000.0000 [IU] | INTRAMUSCULAR | Status: DC
  Administered 2011-04-29: 60000 [IU] via SUBCUTANEOUS
  Filled 2011-04-29: qty 2

## 2011-05-06 ENCOUNTER — Other Ambulatory Visit (HOSPITAL_BASED_OUTPATIENT_CLINIC_OR_DEPARTMENT_OTHER): Payer: Medicare Other | Admitting: Lab

## 2011-05-06 ENCOUNTER — Ambulatory Visit (HOSPITAL_BASED_OUTPATIENT_CLINIC_OR_DEPARTMENT_OTHER): Payer: Medicare Other

## 2011-05-06 VITALS — BP 147/73 | HR 92 | Temp 98.5°F

## 2011-05-06 DIAGNOSIS — D469 Myelodysplastic syndrome, unspecified: Secondary | ICD-10-CM

## 2011-05-06 DIAGNOSIS — D649 Anemia, unspecified: Secondary | ICD-10-CM

## 2011-05-06 LAB — CBC WITH DIFFERENTIAL/PLATELET
BASO%: 0.3 % (ref 0.0–2.0)
LYMPH%: 16.8 % (ref 14.0–49.7)
MCHC: 33.8 g/dL (ref 31.5–36.0)
MCV: 102.7 fL — ABNORMAL HIGH (ref 79.5–101.0)
MONO%: 6.9 % (ref 0.0–14.0)
Platelets: 84 10*3/uL — ABNORMAL LOW (ref 145–400)
RBC: 2.39 10*6/uL — ABNORMAL LOW (ref 3.70–5.45)
nRBC: 0 % (ref 0–0)

## 2011-05-06 MED ORDER — EPOETIN ALFA 40000 UNIT/ML IJ SOLN
60000.0000 [IU] | INTRAMUSCULAR | Status: DC
  Administered 2011-05-06: 60000 [IU] via SUBCUTANEOUS
  Filled 2011-05-06: qty 2

## 2011-05-08 ENCOUNTER — Telehealth: Payer: Self-pay | Admitting: Oncology

## 2011-05-08 ENCOUNTER — Ambulatory Visit (HOSPITAL_BASED_OUTPATIENT_CLINIC_OR_DEPARTMENT_OTHER): Payer: Medicare Other | Admitting: Oncology

## 2011-05-08 ENCOUNTER — Other Ambulatory Visit: Payer: Medicare Other | Admitting: Lab

## 2011-05-08 VITALS — BP 129/62 | HR 91 | Temp 97.4°F | Ht 64.5 in | Wt 146.2 lb

## 2011-05-08 DIAGNOSIS — D469 Myelodysplastic syndrome, unspecified: Secondary | ICD-10-CM

## 2011-05-08 DIAGNOSIS — C9110 Chronic lymphocytic leukemia of B-cell type not having achieved remission: Secondary | ICD-10-CM

## 2011-05-08 DIAGNOSIS — D539 Nutritional anemia, unspecified: Secondary | ICD-10-CM

## 2011-05-08 DIAGNOSIS — D0590 Unspecified type of carcinoma in situ of unspecified breast: Secondary | ICD-10-CM

## 2011-05-08 NOTE — Progress Notes (Signed)
OFFICE PROGRESS NOTE   INTERVAL HISTORY:   She returns as scheduled. No new complaint. She continues weekly Procrit. She was last transfused with packed red blood cells on 04/18/2011.  Objective:  Vital signs in last 24 hours:  Blood pressure 129/62, pulse 91, temperature 97.4 F (36.3 C), temperature source Oral, height 5' 4.5" (1.638 m), weight 146 lb 3.2 oz (66.316 kg).    HEENT: Neck without mass Lymphatics: No cervical, supraclavicular, or axillary nodes Resp: Lungs clear bilaterally Cardio: Regular rate and rhythm GI: No hepatosplenomegaly Vascular: No leg edema   Lab Results:  Lab Results  Component Value Date   WBC 2.6* 05/06/2011   HGB 8.3* 05/06/2011   HCT 24.5* 05/06/2011   MCV 102.7* 05/06/2011   PLT 84* 05/06/2011   ANC 2.0    Medications: I have reviewed the patient's current medications.  Assessment/plan:  1. Chronic lymphocytic leukemia, status post two cycles of fludarabine/rituximab with cycle #2 beginning on 05/08/2009. 2. Pancytopenia, status post a follow-up bone marrow biopsy, 09/01/2009, with involvement by CLL identified.  3. History of a prolonged bleeding time. A diagnostic evaluation including a von Willebrand panel and platelet function studies were negative. 4. Left breast ductal carcinoma in situ, status post a needle localized lumpectomy, 07/19/2008, followed by left breast radiation. She continues tamoxifen. 5. Chills during the Rituxan infusion, 04/05/2009. She was premedicated with Decadron at home prior to cycle #2. She tolerated cycle #2 without significant acute toxicity aside from upper chest and facial erythema. 6. Marked neutropenia on labs, 11/22 and 12/19/2009, likely a delayed nadir related to rituximab. Resolved. 7. Macrocytic anemia-progressive. A laboratory evaluation on 01/14/2011 did not suggest hemolysis. A DAT was negative and the reticulocyte count was low on 01/28/2011. Bone marrow aspiration/biopsy 02/07/2011 showed a  hypercellular marrow for age with dyspoietic changes primarily involving the erythroid and megakaryocytic cell lines. There was no morphologic or immunophenotypic evidence of a B-cell lymphoproliferative process. Cytogenetics returned normal. She began a trial of weekly Procrit on 02/25/2011. She was last transfused on 04/18/2011.  Disposition:  She appears stable. The plan is to continue weekly Procrit. She will return for an office visit in one month. She will continue to receive red cell transfusion support as needed. She would like to continue the Procrit/transfusion therapy as opposed to a trial of 5-azacytidine.   Thornton Papas, MD  05/08/2011  6:59 PM

## 2011-05-08 NOTE — Telephone Encounter (Signed)
gv pt appt schedule for April/May.

## 2011-05-13 ENCOUNTER — Other Ambulatory Visit (HOSPITAL_BASED_OUTPATIENT_CLINIC_OR_DEPARTMENT_OTHER): Payer: Medicare Other

## 2011-05-13 ENCOUNTER — Ambulatory Visit (HOSPITAL_BASED_OUTPATIENT_CLINIC_OR_DEPARTMENT_OTHER): Payer: Medicare Other

## 2011-05-13 VITALS — BP 149/73 | HR 105 | Temp 97.5°F

## 2011-05-13 DIAGNOSIS — D649 Anemia, unspecified: Secondary | ICD-10-CM

## 2011-05-13 DIAGNOSIS — D469 Myelodysplastic syndrome, unspecified: Secondary | ICD-10-CM

## 2011-05-13 LAB — CBC WITH DIFFERENTIAL/PLATELET
BASO%: 0.5 % (ref 0.0–2.0)
LYMPH%: 19.3 % (ref 14.0–49.7)
MCHC: 34.2 g/dL (ref 31.5–36.0)
MONO#: 0.1 10*3/uL (ref 0.1–0.9)
MONO%: 5.6 % (ref 0.0–14.0)
Platelets: 76 10*3/uL — ABNORMAL LOW (ref 145–400)
RBC: 2.12 10*6/uL — ABNORMAL LOW (ref 3.70–5.45)
WBC: 1.7 10*3/uL — ABNORMAL LOW (ref 3.9–10.3)
nRBC: 0 % (ref 0–0)

## 2011-05-13 MED ORDER — EPOETIN ALFA 40000 UNIT/ML IJ SOLN
60000.0000 [IU] | INTRAMUSCULAR | Status: DC
  Administered 2011-05-13: 60000 [IU] via SUBCUTANEOUS
  Filled 2011-05-13: qty 2

## 2011-05-20 ENCOUNTER — Ambulatory Visit (HOSPITAL_BASED_OUTPATIENT_CLINIC_OR_DEPARTMENT_OTHER): Payer: Medicare Other

## 2011-05-20 ENCOUNTER — Other Ambulatory Visit: Payer: Self-pay | Admitting: Oncology

## 2011-05-20 ENCOUNTER — Encounter (HOSPITAL_COMMUNITY)
Admission: RE | Admit: 2011-05-20 | Discharge: 2011-05-20 | Disposition: A | Discharge status: Home / Self Care | Payer: Medicare Other | Source: Ambulatory Visit | Attending: Oncology | Admitting: Oncology

## 2011-05-20 ENCOUNTER — Other Ambulatory Visit: Payer: Self-pay | Admitting: *Deleted

## 2011-05-20 ENCOUNTER — Other Ambulatory Visit: Payer: Medicare Other | Admitting: Lab

## 2011-05-20 VITALS — BP 138/64 | HR 81 | Temp 97.7°F

## 2011-05-20 VITALS — BP 117/66 | HR 84 | Temp 98.6°F | Resp 18

## 2011-05-20 DIAGNOSIS — D649 Anemia, unspecified: Secondary | ICD-10-CM

## 2011-05-20 DIAGNOSIS — D469 Myelodysplastic syndrome, unspecified: Secondary | ICD-10-CM

## 2011-05-20 LAB — CBC WITH DIFFERENTIAL/PLATELET
BASO%: 0.3 % (ref 0.0–2.0)
EOS%: 0.9 % (ref 0.0–7.0)
HGB: 6.3 g/dL — CL (ref 11.6–15.9)
MCH: 36.2 pg — ABNORMAL HIGH (ref 25.1–34.0)
MCHC: 34.8 g/dL (ref 31.5–36.0)
RBC: 1.73 10*6/uL — ABNORMAL LOW (ref 3.70–5.45)
RDW: 28.7 % — ABNORMAL HIGH (ref 11.2–14.5)
lymph#: 0.4 10*3/uL — ABNORMAL LOW (ref 0.9–3.3)
nRBC: 0 % (ref 0–0)

## 2011-05-20 LAB — HOLD TUBE, BLOOD BANK

## 2011-05-20 LAB — PREPARE RBC (CROSSMATCH)

## 2011-05-20 MED ORDER — SODIUM CHLORIDE 0.9 % IV SOLN: 250.0000 mL | Freq: Once | INTRAVENOUS | Status: DC

## 2011-05-20 MED ORDER — EPOETIN ALFA 20000 UNIT/ML IJ SOLN
60000.0000 [IU] | Freq: Once | INTRAMUSCULAR | Status: AC
  Administered 2011-05-20: 60000 [IU] via SUBCUTANEOUS
  Filled 2011-05-20: qty 3

## 2011-05-20 NOTE — Patient Instructions (Signed)
Blood Transfusion Information  WHAT IS A BLOOD TRANSFUSION?  A transfusion is the replacement of blood or some of its parts. Blood is made up of multiple cells which provide different functions.   Red blood cells carry oxygen and are used for blood loss replacement.   White blood cells fight against infection.   Platelets control bleeding.   Plasma helps clot blood.   Other blood products are available for specialized needs, such as hemophilia or other clotting disorders.  BEFORE THE TRANSFUSION   Who gives blood for transfusions?    You may be able to donate blood to be used at a later date on yourself (autologous donation).   Relatives can be asked to donate blood. This is generally not any safer than if you have received blood from a stranger. The same precautions are taken to ensure safety when a relative's blood is donated.   Healthy volunteers who are fully evaluated to make sure their blood is safe. This is blood bank blood.  Transfusion therapy is the safest it has ever been in the practice of medicine. Before blood is taken from a donor, a complete history is taken to make sure that person has no history of diseases nor engages in risky social behavior (examples are intravenous drug use or sexual activity with multiple partners). The donor's travel history is screened to minimize risk of transmitting infections, such as malaria. The donated blood is tested for signs of infectious diseases, such as HIV and hepatitis. The blood is then tested to be sure it is compatible with you in order to minimize the chance of a transfusion reaction. If you or a relative donates blood, this is often done in anticipation of surgery and is not appropriate for emergency situations. It takes many days to process the donated blood.  RISKS AND COMPLICATIONS  Although transfusion therapy is very safe and saves many lives, the main dangers of transfusion include:    Getting an infectious disease.   Developing a  transfusion reaction. This is an allergic reaction to something in the blood you were given. Every precaution is taken to prevent this.  The decision to have a blood transfusion has been considered carefully by your caregiver before blood is given. Blood is not given unless the benefits outweigh the risks.  AFTER THE TRANSFUSION   Right after receiving a blood transfusion, you will usually feel much better and more energetic. This is especially true if your red blood cells have gotten low (anemic). The transfusion raises the level of the red blood cells which carry oxygen, and this usually causes an energy increase.   The nurse administering the transfusion will monitor you carefully for complications.  HOME CARE INSTRUCTIONS   No special instructions are needed after a transfusion. You may find your energy is better. Speak with your caregiver about any limitations on activity for underlying diseases you may have.  SEEK MEDICAL CARE IF:    Your condition is not improving after your transfusion.   You develop redness or irritation at the intravenous (IV) site.  SEEK IMMEDIATE MEDICAL CARE IF:   Any of the following symptoms occur over the next 12 hours:   Shaking chills.   You have a temperature by mouth above 102 F (38.9 C), not controlled by medicine.   Chest, back, or muscle pain.   People around you feel you are not acting correctly or are confused.   Shortness of breath or difficulty breathing.   Dizziness and fainting.     You get a rash or develop hives.   You have a decrease in urine output.   Your urine turns a dark color or changes to pink, red, or brown.  Any of the following symptoms occur over the next 10 days:   You have a temperature by mouth above 102 F (38.9 C), not controlled by medicine.   Shortness of breath.   Weakness after normal activity.   The white part of the eye turns yellow (jaundice).   You have a decrease in the amount of urine or are urinating less often.   Your  urine turns a dark color or changes to pink, red, or brown.  Document Released: 01/05/2000 Document Revised: 12/27/2010 Document Reviewed: 08/24/2007  [employer] Patient Information 2012 [employer].

## 2011-05-21 LAB — TYPE AND SCREEN
ABO/RH(D): O POS
Antibody Screen: NEGATIVE
Unit division: 0

## 2011-05-27 ENCOUNTER — Ambulatory Visit (HOSPITAL_BASED_OUTPATIENT_CLINIC_OR_DEPARTMENT_OTHER): Payer: Medicare Other

## 2011-05-27 ENCOUNTER — Other Ambulatory Visit (HOSPITAL_BASED_OUTPATIENT_CLINIC_OR_DEPARTMENT_OTHER): Payer: Medicare Other | Admitting: Lab

## 2011-05-27 VITALS — BP 160/80 | HR 95 | Temp 97.1°F

## 2011-05-27 DIAGNOSIS — D469 Myelodysplastic syndrome, unspecified: Secondary | ICD-10-CM

## 2011-05-27 DIAGNOSIS — D649 Anemia, unspecified: Secondary | ICD-10-CM

## 2011-05-27 LAB — CBC WITH DIFFERENTIAL/PLATELET
Basophils Absolute: 0 10*3/uL (ref 0.0–0.1)
Eosinophils Absolute: 0 10*3/uL (ref 0.0–0.5)
HCT: 27.2 % — ABNORMAL LOW (ref 34.8–46.6)
HGB: 9.4 g/dL — ABNORMAL LOW (ref 11.6–15.9)
MCV: 99.9 fL (ref 79.5–101.0)
NEUT#: 1.9 10*3/uL (ref 1.5–6.5)
NEUT%: 78.8 % — ABNORMAL HIGH (ref 38.4–76.8)
RDW: 13.4 % (ref 11.2–14.5)
lymph#: 0.4 10*3/uL — ABNORMAL LOW (ref 0.9–3.3)

## 2011-05-27 MED ORDER — EPOETIN ALFA 20000 UNIT/ML IJ SOLN
60000.0000 [IU] | Freq: Once | INTRAMUSCULAR | Status: AC
  Administered 2011-05-27: 60000 [IU] via SUBCUTANEOUS
  Filled 2011-05-27: qty 3

## 2011-06-03 ENCOUNTER — Other Ambulatory Visit (HOSPITAL_BASED_OUTPATIENT_CLINIC_OR_DEPARTMENT_OTHER): Payer: Medicare Other | Admitting: Lab

## 2011-06-03 ENCOUNTER — Ambulatory Visit (HOSPITAL_BASED_OUTPATIENT_CLINIC_OR_DEPARTMENT_OTHER): Payer: Medicare Other

## 2011-06-03 VITALS — BP 135/60 | HR 108 | Temp 97.7°F

## 2011-06-03 DIAGNOSIS — D649 Anemia, unspecified: Secondary | ICD-10-CM

## 2011-06-03 DIAGNOSIS — D469 Myelodysplastic syndrome, unspecified: Secondary | ICD-10-CM

## 2011-06-03 LAB — CBC WITH DIFFERENTIAL/PLATELET
Basophils Absolute: 0 10*3/uL (ref 0.0–0.1)
Eosinophils Absolute: 0 10*3/uL (ref 0.0–0.5)
HCT: 23.6 % — ABNORMAL LOW (ref 34.8–46.6)
LYMPH%: 11.4 % — ABNORMAL LOW (ref 14.0–49.7)
MONO#: 0.3 10*3/uL (ref 0.1–0.9)
NEUT#: 2.8 10*3/uL (ref 1.5–6.5)
NEUT%: 79.5 % — ABNORMAL HIGH (ref 38.4–76.8)
Platelets: 89 10*3/uL — ABNORMAL LOW (ref 145–400)
WBC: 3.6 10*3/uL — ABNORMAL LOW (ref 3.9–10.3)

## 2011-06-03 MED ORDER — EPOETIN ALFA 20000 UNIT/ML IJ SOLN
60000.0000 [IU] | Freq: Once | INTRAMUSCULAR | Status: AC
  Administered 2011-06-03: 60000 [IU] via SUBCUTANEOUS
  Filled 2011-06-03: qty 3

## 2011-06-10 ENCOUNTER — Other Ambulatory Visit (HOSPITAL_BASED_OUTPATIENT_CLINIC_OR_DEPARTMENT_OTHER): Payer: Medicare Other | Admitting: Lab

## 2011-06-10 ENCOUNTER — Encounter (HOSPITAL_COMMUNITY)
Admission: RE | Admit: 2011-06-10 | Discharge: 2011-06-10 | Disposition: A | Discharge status: Home / Self Care | Payer: Medicare Other | Source: Ambulatory Visit | Attending: Oncology | Admitting: Oncology

## 2011-06-10 ENCOUNTER — Telehealth: Payer: Self-pay | Admitting: *Deleted

## 2011-06-10 ENCOUNTER — Ambulatory Visit (HOSPITAL_BASED_OUTPATIENT_CLINIC_OR_DEPARTMENT_OTHER): Payer: Medicare Other | Admitting: Nurse Practitioner

## 2011-06-10 ENCOUNTER — Telehealth: Payer: Self-pay | Admitting: Oncology

## 2011-06-10 ENCOUNTER — Other Ambulatory Visit: Payer: Self-pay | Admitting: *Deleted

## 2011-06-10 VITALS — BP 142/72 | HR 95 | Temp 97.9°F | Ht 64.5 in | Wt 147.9 lb

## 2011-06-10 DIAGNOSIS — D469 Myelodysplastic syndrome, unspecified: Secondary | ICD-10-CM

## 2011-06-10 DIAGNOSIS — D0590 Unspecified type of carcinoma in situ of unspecified breast: Secondary | ICD-10-CM

## 2011-06-10 DIAGNOSIS — C9110 Chronic lymphocytic leukemia of B-cell type not having achieved remission: Secondary | ICD-10-CM

## 2011-06-10 DIAGNOSIS — D649 Anemia, unspecified: Secondary | ICD-10-CM | POA: Insufficient documentation

## 2011-06-10 LAB — PREPARE RBC (CROSSMATCH)

## 2011-06-10 LAB — CBC WITH DIFFERENTIAL/PLATELET
BASO%: 0.2 % (ref 0.0–2.0)
LYMPH%: 17.1 % (ref 14.0–49.7)
MCHC: 34.4 g/dL (ref 31.5–36.0)
MONO#: 0.1 10*3/uL (ref 0.1–0.9)
NEUT#: 1.5 10*3/uL (ref 1.5–6.5)
Platelets: 95 10*3/uL — ABNORMAL LOW (ref 145–400)
RBC: 1.93 10*6/uL — ABNORMAL LOW (ref 3.70–5.45)
nRBC: 0 % (ref 0–0)

## 2011-06-10 MED ORDER — EPOETIN ALFA 20000 UNIT/ML IJ SOLN
60000.0000 [IU] | Freq: Once | INTRAMUSCULAR | Status: AC
  Administered 2011-06-10: 60000 [IU] via SUBCUTANEOUS
  Filled 2011-06-10: qty 3

## 2011-06-10 MED ORDER — ALPRAZOLAM 0.25 MG PO TABS: ORAL_TABLET | ORAL | Status: DC

## 2011-06-10 NOTE — Progress Notes (Signed)
OFFICE PROGRESS NOTE  Interval history:  Anne Doyle returns as scheduled. She continues weekly Procrit injections. She was last transfused on 05/20/2011 when the hemoglobin returned at 6.3.  She does not feel that she needs a blood transfusion today. She would like to be scheduled for blood on 06/13/2011. Overall good energy level. She denies shortness of breath.   Objective: Blood pressure 142/72, pulse 95, temperature 97.9 F (36.6 C), temperature source Oral, height 5' 4.5" (1.638 m), weight 147 lb 14.4 oz (67.087 kg).  Oropharynx is without thrush or ulceration. No palpable cervical, supraclavicular or axillary lymph nodes. Lungs are clear. Regular cardiac rhythm. Abdomen is soft and nontender. No organomegaly. Extremities are without edema.  Lab Results: Lab Results  Component Value Date   WBC 2.0* 06/10/2011   HGB 6.7* 06/10/2011   HCT 19.4* 06/10/2011   MCV 100.5 06/10/2011   PLT 95* 06/10/2011    Chemistry:    Chemistry      Component Value Date/Time   NA 141 01/14/2011 0928   K 4.0 01/14/2011 0928   CL 105 01/14/2011 0928   CO2 28 01/14/2011 0928   BUN 12 01/14/2011 0928   CREATININE 0.78 01/14/2011 0928      Component Value Date/Time   CALCIUM 9.1 01/14/2011 0928   ALKPHOS 48 01/14/2011 0928   AST 17 01/14/2011 0928   ALT 11 01/14/2011 0928   BILITOT 1.2 01/14/2011 0928       Studies/Results: No results found.  Medications: I have reviewed the patient's current medications.  Assessment/Plan:  1. Chronic lymphocytic leukemia, status post two cycles of fludarabine/rituximab with cycle #2 beginning on 05/08/2009. 2. Pancytopenia, status post a follow-up bone marrow biopsy, 09/01/2009, with involvement by CLL identified.  3. History of a prolonged bleeding time. A diagnostic evaluation including a von Willebrand panel and platelet function studies were negative. 4. Left breast ductal carcinoma in situ, status post a needle localized lumpectomy, 07/19/2008,  followed by left breast radiation. She continues tamoxifen. 5. Chills during the Rituxan infusion, 04/05/2009. She was premedicated with Decadron at home prior to cycle #2. She tolerated cycle #2 without significant acute toxicity aside from upper chest and facial erythema. 6. Marked neutropenia on labs, 11/22 and 12/19/2009, likely a delayed nadir related to rituximab. Resolved. 7. Macrocytic anemia-progressive. A laboratory evaluation on 01/14/2011 did not suggest hemolysis. A DAT was negative and the reticulocyte count was low on 01/28/2011. Bone marrow aspiration/biopsy 02/07/2011 showed a hypercellular marrow for age with dyspoietic changes primarily involving the erythroid and megakaryocytic cell lines. There was no morphologic or immunophenotypic evidence of a B-cell lymphoproliferative process. Cytogenetics returned normal. She began a trial of weekly Procrit on 02/25/2011. She was last transfused on 05/20/2011.  Disposition-Anne Doyle appears stable. She continues to require periodic red cell transfusion support. She is interested in a trial of 5-azacytidine. We reviewed potential toxicities including myelosuppression, nausea, mouth sores, diarrhea. She will return for cycle one 5-azacytidine on 06/17/2011. Plan to continue weekly Procrit injections. She will return for a red cell transfusion on 06/13/2011. We will see her in followup on 07/01/2011. She will contact the office in the interim with any problems.  Patient seen with Dr. Truett Perna.  Lonna Cobb ANP/GNP-BC

## 2011-06-10 NOTE — Telephone Encounter (Signed)
appts made and printed for pt aom °

## 2011-06-10 NOTE — Telephone Encounter (Signed)
Per POF order patient to receive vidaza 5 days starting 5/27. Pr dr. Truett Perna, ok to start vidaza on 5/28 and then 6/3.   JMW

## 2011-06-14 ENCOUNTER — Other Ambulatory Visit (HOSPITAL_BASED_OUTPATIENT_CLINIC_OR_DEPARTMENT_OTHER): Payer: Medicare Other | Admitting: Lab

## 2011-06-14 ENCOUNTER — Ambulatory Visit (HOSPITAL_BASED_OUTPATIENT_CLINIC_OR_DEPARTMENT_OTHER): Payer: Medicare Other

## 2011-06-14 VITALS — BP 121/72 | HR 93 | Temp 97.9°F | Resp 16

## 2011-06-14 DIAGNOSIS — D469 Myelodysplastic syndrome, unspecified: Secondary | ICD-10-CM

## 2011-06-14 DIAGNOSIS — D649 Anemia, unspecified: Secondary | ICD-10-CM

## 2011-06-14 LAB — TYPE AND SCREEN
ABO/RH(D): O POS
Antibody Screen: NEGATIVE
Unit division: 0
Unit division: 0

## 2011-06-14 LAB — CBC WITH DIFFERENTIAL/PLATELET
Basophils Absolute: 0 10*3/uL (ref 0.0–0.1)
Eosinophils Absolute: 0 10*3/uL (ref 0.0–0.5)
HGB: 6.3 g/dL — CL (ref 11.6–15.9)
LYMPH%: 18.7 % (ref 14.0–49.7)
MCV: 96.8 fL (ref 79.5–101.0)
MONO%: 6.4 % (ref 0.0–14.0)
NEUT#: 1.7 10*3/uL (ref 1.5–6.5)
Platelets: 93 10*3/uL — ABNORMAL LOW (ref 145–400)

## 2011-06-14 LAB — PREPARE RBC (CROSSMATCH)

## 2011-06-14 MED ORDER — SODIUM CHLORIDE 0.9 % IV SOLN
250.0000 mL | Freq: Once | INTRAVENOUS | Status: AC
  Administered 2011-06-14: 250 mL via INTRAVENOUS

## 2011-06-14 NOTE — Patient Instructions (Signed)
Blood Transfusion Information  WHAT IS A BLOOD TRANSFUSION?  A transfusion is the replacement of blood or some of its parts. Blood is made up of multiple cells which provide different functions.   Red blood cells carry oxygen and are used for blood loss replacement.   White blood cells fight against infection.   Platelets control bleeding.   Plasma helps clot blood.   Other blood products are available for specialized needs, such as hemophilia or other clotting disorders.  BEFORE THE TRANSFUSION   Who gives blood for transfusions?    You may be able to donate blood to be used at a later date on yourself (autologous donation).   Relatives can be asked to donate blood. This is generally not any safer than if you have received blood from a stranger. The same precautions are taken to ensure safety when a relative's blood is donated.   Healthy volunteers who are fully evaluated to make sure their blood is safe. This is blood bank blood.  Transfusion therapy is the safest it has ever been in the practice of medicine. Before blood is taken from a donor, a complete history is taken to make sure that person has no history of diseases nor engages in risky social behavior (examples are intravenous drug use or sexual activity with multiple partners). The donor's travel history is screened to minimize risk of transmitting infections, such as malaria. The donated blood is tested for signs of infectious diseases, such as HIV and hepatitis. The blood is then tested to be sure it is compatible with you in order to minimize the chance of a transfusion reaction. If you or a relative donates blood, this is often done in anticipation of surgery and is not appropriate for emergency situations. It takes many days to process the donated blood.  RISKS AND COMPLICATIONS  Although transfusion therapy is very safe and saves many lives, the main dangers of transfusion include:    Getting an infectious disease.   Developing a  transfusion reaction. This is an allergic reaction to something in the blood you were given. Every precaution is taken to prevent this.  The decision to have a blood transfusion has been considered carefully by your caregiver before blood is given. Blood is not given unless the benefits outweigh the risks.  AFTER THE TRANSFUSION   Right after receiving a blood transfusion, you will usually feel much better and more energetic. This is especially true if your red blood cells have gotten low (anemic). The transfusion raises the level of the red blood cells which carry oxygen, and this usually causes an energy increase.   The nurse administering the transfusion will monitor you carefully for complications.  HOME CARE INSTRUCTIONS   No special instructions are needed after a transfusion. You may find your energy is better. Speak with your caregiver about any limitations on activity for underlying diseases you may have.  SEEK MEDICAL CARE IF:    Your condition is not improving after your transfusion.   You develop redness or irritation at the intravenous (IV) site.  SEEK IMMEDIATE MEDICAL CARE IF:   Any of the following symptoms occur over the next 12 hours:   Shaking chills.   You have a temperature by mouth above 102 F (38.9 C), not controlled by medicine.   Chest, back, or muscle pain.   People around you feel you are not acting correctly or are confused.   Shortness of breath or difficulty breathing.   Dizziness and fainting.     You get a rash or develop hives.   You have a decrease in urine output.   Your urine turns a dark color or changes to pink, red, or brown.  Any of the following symptoms occur over the next 10 days:   You have a temperature by mouth above 102 F (38.9 C), not controlled by medicine.   Shortness of breath.   Weakness after normal activity.   The white part of the eye turns yellow (jaundice).   You have a decrease in the amount of urine or are urinating less often.   Your  urine turns a dark color or changes to pink, red, or brown.  Document Released: 01/05/2000 Document Revised: 12/27/2010 Document Reviewed: 08/24/2007  [employer] Patient Information 2012 [employer].

## 2011-06-15 LAB — TYPE AND SCREEN
ABO/RH(D): O POS
Antibody Screen: NEGATIVE
Unit division: 0

## 2011-06-18 ENCOUNTER — Other Ambulatory Visit: Payer: Self-pay | Admitting: *Deleted

## 2011-06-18 ENCOUNTER — Ambulatory Visit: Payer: Medicare Other

## 2011-06-18 ENCOUNTER — Other Ambulatory Visit (HOSPITAL_BASED_OUTPATIENT_CLINIC_OR_DEPARTMENT_OTHER): Payer: Medicare Other | Admitting: Lab

## 2011-06-18 ENCOUNTER — Ambulatory Visit (HOSPITAL_BASED_OUTPATIENT_CLINIC_OR_DEPARTMENT_OTHER): Payer: Medicare Other

## 2011-06-18 ENCOUNTER — Other Ambulatory Visit: Payer: Self-pay | Admitting: Oncology

## 2011-06-18 VITALS — BP 136/78 | HR 92 | Temp 98.8°F

## 2011-06-18 DIAGNOSIS — D649 Anemia, unspecified: Secondary | ICD-10-CM

## 2011-06-18 DIAGNOSIS — D469 Myelodysplastic syndrome, unspecified: Secondary | ICD-10-CM

## 2011-06-18 DIAGNOSIS — Z5111 Encounter for antineoplastic chemotherapy: Secondary | ICD-10-CM

## 2011-06-18 LAB — CBC WITH DIFFERENTIAL/PLATELET
Basophils Absolute: 0 10*3/uL (ref 0.0–0.1)
Eosinophils Absolute: 0 10*3/uL (ref 0.0–0.5)
HGB: 8.7 g/dL — ABNORMAL LOW (ref 11.6–15.9)
LYMPH%: 19.1 % (ref 14.0–49.7)
MCV: 95.1 fL (ref 79.5–101.0)
MONO%: 5.7 % (ref 0.0–14.0)
NEUT#: 1.9 10*3/uL (ref 1.5–6.5)
NEUT%: 74.1 % (ref 38.4–76.8)
Platelets: 73 10*3/uL — ABNORMAL LOW (ref 145–400)

## 2011-06-18 LAB — COMPREHENSIVE METABOLIC PANEL
AST: 28 U/L (ref 0–37)
Alkaline Phosphatase: 65 U/L (ref 39–117)
BUN: 15 mg/dL (ref 6–23)
Glucose, Bld: 97 mg/dL (ref 70–99)
Sodium: 142 mEq/L (ref 135–145)
Total Bilirubin: 0.5 mg/dL (ref 0.3–1.2)

## 2011-06-18 MED ORDER — AZACITIDINE CHEMO SQ INJECTION
75.0000 mg/m2 | Freq: Once | INTRAMUSCULAR | Status: AC
  Administered 2011-06-18: 132.5 mg via SUBCUTANEOUS
  Filled 2011-06-18: qty 26.5

## 2011-06-18 MED ORDER — ONDANSETRON HCL 8 MG PO TABS
8.0000 mg | ORAL_TABLET | Freq: Once | ORAL | Status: AC
  Administered 2011-06-18: 8 mg via ORAL

## 2011-06-18 MED ORDER — EPOETIN ALFA 40000 UNIT/ML IJ SOLN
60000.0000 [IU] | Freq: Once | INTRAMUSCULAR | Status: AC
  Administered 2011-06-18: 60000 [IU] via SUBCUTANEOUS
  Filled 2011-06-18: qty 2

## 2011-06-18 MED ORDER — PROCHLORPERAZINE MALEATE 10 MG PO TABS: 10.0000 mg | ORAL_TABLET | Freq: Four times a day (QID) | ORAL | Status: DC | PRN

## 2011-06-19 ENCOUNTER — Other Ambulatory Visit: Payer: Self-pay | Admitting: *Deleted

## 2011-06-19 ENCOUNTER — Ambulatory Visit (HOSPITAL_BASED_OUTPATIENT_CLINIC_OR_DEPARTMENT_OTHER): Payer: Medicare Other

## 2011-06-19 VITALS — BP 156/76 | HR 100 | Temp 98.4°F

## 2011-06-19 DIAGNOSIS — D469 Myelodysplastic syndrome, unspecified: Secondary | ICD-10-CM

## 2011-06-19 DIAGNOSIS — Z5111 Encounter for antineoplastic chemotherapy: Secondary | ICD-10-CM

## 2011-06-19 MED ORDER — ONDANSETRON HCL 8 MG PO TABS
8.0000 mg | ORAL_TABLET | Freq: Once | ORAL | Status: AC
  Administered 2011-06-19: 8 mg via ORAL

## 2011-06-19 MED ORDER — AZACITIDINE CHEMO SQ INJECTION
75.0000 mg/m2 | Freq: Once | INTRAMUSCULAR | Status: AC
Start: 2011-06-19 — End: 2011-06-19
  Administered 2011-06-19: 132.5 mg via SUBCUTANEOUS
  Filled 2011-06-19: qty 26.5

## 2011-06-20 ENCOUNTER — Ambulatory Visit (HOSPITAL_BASED_OUTPATIENT_CLINIC_OR_DEPARTMENT_OTHER): Payer: Medicare Other

## 2011-06-20 VITALS — BP 135/61 | HR 89 | Temp 98.2°F

## 2011-06-20 DIAGNOSIS — D469 Myelodysplastic syndrome, unspecified: Secondary | ICD-10-CM

## 2011-06-20 DIAGNOSIS — Z5111 Encounter for antineoplastic chemotherapy: Secondary | ICD-10-CM

## 2011-06-20 MED ORDER — ONDANSETRON HCL 8 MG PO TABS
8.0000 mg | ORAL_TABLET | Freq: Once | ORAL | Status: AC
  Administered 2011-06-20: 8 mg via ORAL

## 2011-06-20 MED ORDER — AZACITIDINE CHEMO SQ INJECTION
75.0000 mg/m2 | Freq: Once | INTRAMUSCULAR | Status: AC
  Administered 2011-06-20: 132.5 mg via SUBCUTANEOUS
  Filled 2011-06-20: qty 26.5

## 2011-06-20 NOTE — Patient Instructions (Signed)
Call MD for problems.  Patient to scheduling. 

## 2011-06-21 ENCOUNTER — Ambulatory Visit (HOSPITAL_BASED_OUTPATIENT_CLINIC_OR_DEPARTMENT_OTHER): Payer: Medicare Other

## 2011-06-21 VITALS — BP 133/69 | HR 86 | Temp 97.4°F

## 2011-06-21 DIAGNOSIS — D649 Anemia, unspecified: Secondary | ICD-10-CM

## 2011-06-21 DIAGNOSIS — D469 Myelodysplastic syndrome, unspecified: Secondary | ICD-10-CM

## 2011-06-21 DIAGNOSIS — Z5111 Encounter for antineoplastic chemotherapy: Secondary | ICD-10-CM

## 2011-06-21 MED ORDER — AZACITIDINE CHEMO SQ INJECTION
75.0000 mg/m2 | Freq: Once | INTRAMUSCULAR | Status: AC
  Administered 2011-06-21: 132.5 mg via SUBCUTANEOUS
  Filled 2011-06-21: qty 26.5

## 2011-06-21 MED ORDER — ONDANSETRON HCL 8 MG PO TABS
8.0000 mg | ORAL_TABLET | Freq: Once | ORAL | Status: AC
  Administered 2011-06-21: 8 mg via ORAL

## 2011-06-21 NOTE — Patient Instructions (Signed)
Call Dr. Truett Perna if you have problems.

## 2011-06-22 ENCOUNTER — Ambulatory Visit (HOSPITAL_BASED_OUTPATIENT_CLINIC_OR_DEPARTMENT_OTHER): Payer: Medicare Other

## 2011-06-22 ENCOUNTER — Other Ambulatory Visit: Payer: Self-pay | Admitting: Oncology

## 2011-06-22 VITALS — BP 144/79 | HR 94 | Temp 98.9°F

## 2011-06-22 DIAGNOSIS — D469 Myelodysplastic syndrome, unspecified: Secondary | ICD-10-CM

## 2011-06-22 MED ORDER — AZACITIDINE CHEMO SQ INJECTION
75.0000 mg/m2 | Freq: Once | INTRAMUSCULAR | Status: AC
  Administered 2011-06-22: 132.5 mg via SUBCUTANEOUS
  Filled 2011-06-22: qty 5.3

## 2011-06-22 MED ORDER — ONDANSETRON HCL 8 MG PO TABS
8.0000 mg | ORAL_TABLET | Freq: Once | ORAL | Status: AC
  Administered 2011-06-22: 8 mg via ORAL

## 2011-06-24 ENCOUNTER — Inpatient Hospital Stay: Payer: Medicare Other

## 2011-06-24 ENCOUNTER — Ambulatory Visit (HOSPITAL_BASED_OUTPATIENT_CLINIC_OR_DEPARTMENT_OTHER): Payer: Medicare Other

## 2011-06-24 ENCOUNTER — Other Ambulatory Visit: Payer: Medicare Other | Admitting: Lab

## 2011-06-24 ENCOUNTER — Other Ambulatory Visit (HOSPITAL_BASED_OUTPATIENT_CLINIC_OR_DEPARTMENT_OTHER): Payer: Medicare Other

## 2011-06-24 VITALS — BP 137/70 | HR 101 | Temp 98.3°F

## 2011-06-24 DIAGNOSIS — D649 Anemia, unspecified: Secondary | ICD-10-CM

## 2011-06-24 DIAGNOSIS — D469 Myelodysplastic syndrome, unspecified: Secondary | ICD-10-CM

## 2011-06-24 LAB — CBC WITH DIFFERENTIAL/PLATELET
Basophils Absolute: 0 10*3/uL (ref 0.0–0.1)
EOS%: 0.8 % (ref 0.0–7.0)
HCT: 23 % — ABNORMAL LOW (ref 34.8–46.6)
HGB: 7.8 g/dL — ABNORMAL LOW (ref 11.6–15.9)
MCH: 32.4 pg (ref 25.1–34.0)
MCV: 94.7 fL (ref 79.5–101.0)
MONO%: 4.7 % (ref 0.0–14.0)
NEUT%: 73.4 % (ref 38.4–76.8)
Platelets: 73 10*3/uL — ABNORMAL LOW (ref 145–400)
lymph#: 0.4 10*3/uL — ABNORMAL LOW (ref 0.9–3.3)

## 2011-06-24 MED ORDER — EPOETIN ALFA 20000 UNIT/ML IJ SOLN
60000.0000 [IU] | Freq: Once | INTRAMUSCULAR | Status: AC
  Administered 2011-06-24: 60000 [IU] via SUBCUTANEOUS

## 2011-07-01 ENCOUNTER — Other Ambulatory Visit (HOSPITAL_BASED_OUTPATIENT_CLINIC_OR_DEPARTMENT_OTHER): Payer: Medicare Other

## 2011-07-01 ENCOUNTER — Ambulatory Visit (HOSPITAL_BASED_OUTPATIENT_CLINIC_OR_DEPARTMENT_OTHER): Payer: Medicare Other | Admitting: Oncology

## 2011-07-01 ENCOUNTER — Encounter (HOSPITAL_COMMUNITY)
Admission: RE | Admit: 2011-07-01 | Discharge: 2011-07-01 | Disposition: A | Discharge status: Home / Self Care | Payer: Medicare Other | Source: Ambulatory Visit | Attending: Oncology | Admitting: Oncology

## 2011-07-01 ENCOUNTER — Ambulatory Visit: Payer: Medicare Other

## 2011-07-01 VITALS — BP 128/58 | HR 91 | Temp 98.7°F | Resp 16

## 2011-07-01 VITALS — BP 145/65 | HR 98 | Temp 98.0°F | Ht 65.4 in | Wt 146.6 lb

## 2011-07-01 DIAGNOSIS — D469 Myelodysplastic syndrome, unspecified: Secondary | ICD-10-CM

## 2011-07-01 DIAGNOSIS — D649 Anemia, unspecified: Secondary | ICD-10-CM | POA: Insufficient documentation

## 2011-07-01 DIAGNOSIS — C9110 Chronic lymphocytic leukemia of B-cell type not having achieved remission: Secondary | ICD-10-CM

## 2011-07-01 DIAGNOSIS — C50419 Malignant neoplasm of upper-outer quadrant of unspecified female breast: Secondary | ICD-10-CM

## 2011-07-01 LAB — CBC WITH DIFFERENTIAL/PLATELET
BASO%: 0 % (ref 0.0–2.0)
Basophils Absolute: 0 10*3/uL (ref 0.0–0.1)
EOS%: 0.6 % (ref 0.0–7.0)
Eosinophils Absolute: 0 10*3/uL (ref 0.0–0.5)
HCT: 19.3 % — ABNORMAL LOW (ref 34.8–46.6)
HGB: 6.5 g/dL — CL (ref 11.6–15.9)
LYMPH%: 28 % (ref 14.0–49.7)
MCH: 31.4 pg (ref 25.1–34.0)
MCHC: 33.7 g/dL (ref 31.5–36.0)
MCV: 93.2 fL (ref 79.5–101.0)
MONO#: 0.1 10*3/uL (ref 0.1–0.9)
MONO%: 4 % (ref 0.0–14.0)
NEUT#: 1.2 10*3/uL — ABNORMAL LOW (ref 1.5–6.5)
NEUT%: 67.4 % (ref 38.4–76.8)
Platelets: 67 10*3/uL — ABNORMAL LOW (ref 145–400)
RBC: 2.07 10*6/uL — ABNORMAL LOW (ref 3.70–5.45)
RDW: 15.5 % — ABNORMAL HIGH (ref 11.2–14.5)
WBC: 1.8 10*3/uL — ABNORMAL LOW (ref 3.9–10.3)
lymph#: 0.5 10*3/uL — ABNORMAL LOW (ref 0.9–3.3)
nRBC: 2 % — ABNORMAL HIGH (ref 0–0)

## 2011-07-01 LAB — COMPREHENSIVE METABOLIC PANEL
Albumin: 4.5 g/dL (ref 3.5–5.2)
BUN: 9 mg/dL (ref 6–23)
Calcium: 8.7 mg/dL (ref 8.4–10.5)
Chloride: 107 mEq/L (ref 96–112)
Glucose, Bld: 107 mg/dL — ABNORMAL HIGH (ref 70–99)
Potassium: 3.8 mEq/L (ref 3.5–5.3)

## 2011-07-01 LAB — PREPARE RBC (CROSSMATCH)

## 2011-07-01 MED ORDER — EPOETIN ALFA 20000 UNIT/ML IJ SOLN
60000.0000 [IU] | Freq: Once | INTRAMUSCULAR | Status: AC
  Administered 2011-07-01: 60000 [IU] via SUBCUTANEOUS
  Filled 2011-07-01: qty 3

## 2011-07-01 MED ORDER — SODIUM CHLORIDE 0.9 % IV SOLN
250.0000 mL | Freq: Once | INTRAVENOUS | Status: AC
  Administered 2011-07-01: 100 mL via INTRAVENOUS

## 2011-07-01 NOTE — Patient Instructions (Signed)
Blood Transfusion Information  WHAT IS A BLOOD TRANSFUSION?  A transfusion is the replacement of blood or some of its parts. Blood is made up of multiple cells which provide different functions.   Red blood cells carry oxygen and are used for blood loss replacement.   White blood cells fight against infection.   Platelets control bleeding.   Plasma helps clot blood.   Other blood products are available for specialized needs, such as hemophilia or other clotting disorders.  BEFORE THE TRANSFUSION   Who gives blood for transfusions?    You may be able to donate blood to be used at a later date on yourself (autologous donation).   Relatives can be asked to donate blood. This is generally not any safer than if you have received blood from a stranger. The same precautions are taken to ensure safety when a relative's blood is donated.   Healthy volunteers who are fully evaluated to make sure their blood is safe. This is blood bank blood.  Transfusion therapy is the safest it has ever been in the practice of medicine. Before blood is taken from a donor, a complete history is taken to make sure that person has no history of diseases nor engages in risky social behavior (examples are intravenous drug use or sexual activity with multiple partners). The donor's travel history is screened to minimize risk of transmitting infections, such as malaria. The donated blood is tested for signs of infectious diseases, such as HIV and hepatitis. The blood is then tested to be sure it is compatible with you in order to minimize the chance of a transfusion reaction. If you or a relative donates blood, this is often done in anticipation of surgery and is not appropriate for emergency situations. It takes many days to process the donated blood.  RISKS AND COMPLICATIONS  Although transfusion therapy is very safe and saves many lives, the main dangers of transfusion include:    Getting an infectious disease.   Developing a  transfusion reaction. This is an allergic reaction to something in the blood you were given. Every precaution is taken to prevent this.  The decision to have a blood transfusion has been considered carefully by your caregiver before blood is given. Blood is not given unless the benefits outweigh the risks.  AFTER THE TRANSFUSION   Right after receiving a blood transfusion, you will usually feel much better and more energetic. This is especially true if your red blood cells have gotten low (anemic). The transfusion raises the level of the red blood cells which carry oxygen, and this usually causes an energy increase.   The nurse administering the transfusion will monitor you carefully for complications.  HOME CARE INSTRUCTIONS   No special instructions are needed after a transfusion. You may find your energy is better. Speak with your caregiver about any limitations on activity for underlying diseases you may have.  SEEK MEDICAL CARE IF:    Your condition is not improving after your transfusion.   You develop redness or irritation at the intravenous (IV) site.  SEEK IMMEDIATE MEDICAL CARE IF:   Any of the following symptoms occur over the next 12 hours:   Shaking chills.   You have a temperature by mouth above 102 F (38.9 C), not controlled by medicine.   Chest, back, or muscle pain.   People around you feel you are not acting correctly or are confused.   Shortness of breath or difficulty breathing.   Dizziness and fainting.     You get a rash or develop hives.   You have a decrease in urine output.   Your urine turns a dark color or changes to pink, red, or brown.  Any of the following symptoms occur over the next 10 days:   You have a temperature by mouth above 102 F (38.9 C), not controlled by medicine.   Shortness of breath.   Weakness after normal activity.   The white part of the eye turns yellow (jaundice).   You have a decrease in the amount of urine or are urinating less often.   Your  urine turns a dark color or changes to pink, red, or brown.  Document Released: 01/05/2000 Document Revised: 12/27/2010 Document Reviewed: 08/24/2007  [employer] Patient Information 2012 [employer].

## 2011-07-01 NOTE — Progress Notes (Signed)
   [facility]    OFFICE PROGRESS NOTE   INTERVAL HISTORY:   She returns as scheduled. Mild fatigue and tachycardia today. She completed a first cycle of chemotherapy beginning on may 28th 2013. She tolerated chemotherapy well. No nausea, mouth sores, or diarrhea. She reports constipation following chemotherapy.  Objective:  Vital signs in last 24 hours:  Blood pressure 145/65, pulse 98, temperature 98 F (36.7 C), temperature source Oral, height 5' 5.4" (1.661 m), weight 146 lb 9.6 oz (66.497 kg).    HEENT: No thrush or ulcers Resp: Lungs clear bilaterally Cardio: Regular rate and rhythm GI: No hepatosplenomegaly, Vascular: No leg edema   Lab Results:  Lab Results  Component Value Date   WBC 1.8* 07/01/2011   HGB 6.5* 07/01/2011   HCT 19.3* 07/01/2011   MCV 93.2 07/01/2011   PLT 67* 07/01/2011   ANC 1.2    Medications: I have reviewed the patient's current medications.  Assessment/Plan:   1. Chronic lymphocytic leukemia, status post two cycles of fludarabine/rituximab with cycle #2 beginning on 05/08/2009. 2. Pancytopenia, status post a follow-up bone marrow biopsy, 09/01/2009, with involvement by CLL identified.  3. History of a prolonged bleeding time. A diagnostic evaluation including a von Willebrand panel and platelet function studies were negative. 4. Left breast ductal carcinoma in situ, status post a needle localized lumpectomy, 07/19/2008, followed by left breast radiation. She continues tamoxifen. 5. Chills during the Rituxan infusion, 04/05/2009. She was premedicated with Decadron at home prior to cycle #2. She tolerated cycle #2 without significant acute toxicity aside from upper chest and facial erythema. 6. Marked neutropenia on labs, 11/22 and 12/19/2009, likely a delayed nadir related to rituximab. Resolved. 7. Macrocytic anemia-progressive. A laboratory evaluation on 01/14/2011 did not suggest hemolysis. A DAT was negative and the  reticulocyte count was low on 01/28/2011. Bone marrow aspiration/biopsy 02/07/2011 showed a hypercellular marrow for age with dyspoietic changes primarily involving the erythroid and megakaryocytic cell lines. There was no morphologic or immunophenotypic evidence of a B-cell lymphoproliferative process. Cytogenetics returned normal. She began a trial of weekly Procrit on 02/25/2011. She was last transfused on 06/14/2011.                  -cycle 1 of 5 azacytidine started on 06/18/11  Disposition:  She tolerated the first cycle of chemotherapy without significant acute toxicity. The plan is to proceed with a second cycle on 07/15/2011. She will be transfused with 2 units of packed red blood cells this week. She continues weekly Procrit.   [name] will return for an office visit on 07/22/2011.   Thornton Papas, MD  07/01/2011  1:52 PM

## 2011-07-02 LAB — TYPE AND SCREEN
ABO/RH(D): O POS
Antibody Screen: NEGATIVE
Unit division: 0

## 2011-07-08 ENCOUNTER — Other Ambulatory Visit: Payer: Self-pay

## 2011-07-08 ENCOUNTER — Ambulatory Visit (HOSPITAL_BASED_OUTPATIENT_CLINIC_OR_DEPARTMENT_OTHER): Payer: Medicare Other

## 2011-07-08 ENCOUNTER — Other Ambulatory Visit (HOSPITAL_BASED_OUTPATIENT_CLINIC_OR_DEPARTMENT_OTHER): Payer: Medicare Other | Admitting: Lab

## 2011-07-08 ENCOUNTER — Telehealth: Payer: Self-pay | Admitting: Oncology

## 2011-07-08 VITALS — BP 129/78 | HR 94 | Temp 98.1°F

## 2011-07-08 DIAGNOSIS — D649 Anemia, unspecified: Secondary | ICD-10-CM

## 2011-07-08 DIAGNOSIS — D469 Myelodysplastic syndrome, unspecified: Secondary | ICD-10-CM

## 2011-07-08 LAB — CBC WITH DIFFERENTIAL/PLATELET
BASO%: 1.6 % (ref 0.0–2.0)
EOS%: 1.6 % (ref 0.0–7.0)
HCT: 25.5 % — ABNORMAL LOW (ref 34.8–46.6)
LYMPH%: 43.8 % (ref 14.0–49.7)
MCH: 30.9 pg (ref 25.1–34.0)
MCHC: 33.7 g/dL (ref 31.5–36.0)
NEUT%: 49.9 % (ref 38.4–76.8)
RBC: 2.78 10*6/uL — ABNORMAL LOW (ref 3.70–5.45)
lymph#: 0.3 10*3/uL — ABNORMAL LOW (ref 0.9–3.3)

## 2011-07-08 MED ORDER — EPOETIN ALFA 20000 UNIT/ML IJ SOLN
60000.0000 [IU] | Freq: Once | INTRAMUSCULAR | Status: AC
  Administered 2011-07-08: 60000 [IU] via SUBCUTANEOUS
  Filled 2011-07-08: qty 3

## 2011-07-08 NOTE — Telephone Encounter (Signed)
appts made and printed for pt aom °

## 2011-07-08 NOTE — Progress Notes (Signed)
WBC 0.6, ANC 0.3 today.  Per Dr. Truett Perna, pt to call for fever, neutropenic precautions, and recheck CBC 6/20 Thursday.  Informed Liborio Nixon, injection RN, who will make pt aware of these instructions, and pt to stop at scheduling for appt.

## 2011-07-11 ENCOUNTER — Other Ambulatory Visit: Payer: Self-pay | Admitting: *Deleted

## 2011-07-11 ENCOUNTER — Other Ambulatory Visit (HOSPITAL_BASED_OUTPATIENT_CLINIC_OR_DEPARTMENT_OTHER): Payer: Medicare Other | Admitting: Lab

## 2011-07-11 DIAGNOSIS — D709 Neutropenia, unspecified: Secondary | ICD-10-CM

## 2011-07-11 DIAGNOSIS — D469 Myelodysplastic syndrome, unspecified: Secondary | ICD-10-CM

## 2011-07-11 LAB — CBC WITH DIFFERENTIAL/PLATELET
EOS%: 2.4 % (ref 0.0–7.0)
MCH: 32.2 pg (ref 25.1–34.0)
MCV: 92.9 fL (ref 79.5–101.0)
MONO%: 5.2 % (ref 0.0–14.0)
NEUT#: 0.1 10*3/uL — CL (ref 1.5–6.5)
RBC: 2.53 10*6/uL — ABNORMAL LOW (ref 3.70–5.45)
RDW: 12.6 % (ref 11.2–14.5)

## 2011-07-11 MED ORDER — CIPROFLOXACIN HCL 500 MG PO TABS: 500.0000 mg | ORAL_TABLET | Freq: Two times a day (BID) | ORAL | Status: AC

## 2011-07-15 ENCOUNTER — Other Ambulatory Visit: Payer: Self-pay | Admitting: *Deleted

## 2011-07-15 ENCOUNTER — Other Ambulatory Visit: Payer: Self-pay | Admitting: Physician Assistant

## 2011-07-15 ENCOUNTER — Ambulatory Visit: Payer: Medicare Other

## 2011-07-15 ENCOUNTER — Other Ambulatory Visit: Payer: Medicare Other | Admitting: Lab

## 2011-07-15 ENCOUNTER — Other Ambulatory Visit (HOSPITAL_BASED_OUTPATIENT_CLINIC_OR_DEPARTMENT_OTHER): Payer: Medicare Other | Admitting: Lab

## 2011-07-15 ENCOUNTER — Telehealth: Payer: Self-pay | Admitting: Oncology

## 2011-07-15 ENCOUNTER — Other Ambulatory Visit: Payer: Self-pay | Admitting: Nurse Practitioner

## 2011-07-15 ENCOUNTER — Ambulatory Visit (HOSPITAL_BASED_OUTPATIENT_CLINIC_OR_DEPARTMENT_OTHER): Payer: Medicare Other

## 2011-07-15 VITALS — BP 120/77 | HR 93 | Temp 97.3°F | Resp 16

## 2011-07-15 DIAGNOSIS — D469 Myelodysplastic syndrome, unspecified: Secondary | ICD-10-CM

## 2011-07-15 DIAGNOSIS — D649 Anemia, unspecified: Secondary | ICD-10-CM

## 2011-07-15 DIAGNOSIS — D696 Thrombocytopenia, unspecified: Secondary | ICD-10-CM

## 2011-07-15 LAB — CBC WITH DIFFERENTIAL/PLATELET
Basophils Absolute: 0 10*3/uL (ref 0.0–0.1)
EOS%: 2.2 % (ref 0.0–7.0)
MCH: 30 pg (ref 25.1–34.0)
MCV: 88.8 fL (ref 79.5–101.0)
MONO%: 10.9 % (ref 0.0–14.0)
RBC: 2.23 10*6/uL — ABNORMAL LOW (ref 3.70–5.45)
RDW: 14.3 % (ref 11.2–14.5)

## 2011-07-15 LAB — COMPREHENSIVE METABOLIC PANEL
AST: 19 U/L (ref 0–37)
Albumin: 4.6 g/dL (ref 3.5–5.2)
Alkaline Phosphatase: 70 U/L (ref 39–117)
Potassium: 3.5 mEq/L (ref 3.5–5.3)
Sodium: 140 mEq/L (ref 135–145)
Total Protein: 5.8 g/dL — ABNORMAL LOW (ref 6.0–8.3)

## 2011-07-15 LAB — HOLD TUBE, BLOOD BANK

## 2011-07-15 MED ORDER — SODIUM CHLORIDE 0.9 % IV SOLN
250.0000 mL | Freq: Once | INTRAVENOUS | Status: AC
  Administered 2011-07-15: 250 mL via INTRAVENOUS

## 2011-07-15 MED ORDER — EPOETIN ALFA 40000 UNIT/ML IJ SOLN
60000.0000 [IU] | Freq: Once | INTRAMUSCULAR | Status: AC
  Administered 2011-07-15: 60000 [IU] via SUBCUTANEOUS
  Filled 2011-07-15: qty 2

## 2011-07-15 NOTE — Telephone Encounter (Signed)
gv pt appt for 06/27

## 2011-07-15 NOTE — Patient Instructions (Signed)
Blood Transfusion Information  WHAT IS A BLOOD TRANSFUSION?  A transfusion is the replacement of blood or some of its parts. Blood is made up of multiple cells which provide different functions.   Red blood cells carry oxygen and are used for blood loss replacement.   White blood cells fight against infection.   Platelets control bleeding.   Plasma helps clot blood.   Other blood products are available for specialized needs, such as hemophilia or other clotting disorders.  BEFORE THE TRANSFUSION   Who gives blood for transfusions?    You may be able to donate blood to be used at a later date on yourself (autologous donation).   Relatives can be asked to donate blood. This is generally not any safer than if you have received blood from a stranger. The same precautions are taken to ensure safety when a relative's blood is donated.   Healthy volunteers who are fully evaluated to make sure their blood is safe. This is blood bank blood.  Transfusion therapy is the safest it has ever been in the practice of medicine. Before blood is taken from a donor, a complete history is taken to make sure that person has no history of diseases nor engages in risky social behavior (examples are intravenous drug use or sexual activity with multiple partners). The donor's travel history is screened to minimize risk of transmitting infections, such as malaria. The donated blood is tested for signs of infectious diseases, such as HIV and hepatitis. The blood is then tested to be sure it is compatible with you in order to minimize the chance of a transfusion reaction. If you or a relative donates blood, this is often done in anticipation of surgery and is not appropriate for emergency situations. It takes many days to process the donated blood.  RISKS AND COMPLICATIONS  Although transfusion therapy is very safe and saves many lives, the main dangers of transfusion include:    Getting an infectious disease.   Developing a  transfusion reaction. This is an allergic reaction to something in the blood you were given. Every precaution is taken to prevent this.  The decision to have a blood transfusion has been considered carefully by your caregiver before blood is given. Blood is not given unless the benefits outweigh the risks.  AFTER THE TRANSFUSION   Right after receiving a blood transfusion, you will usually feel much better and more energetic. This is especially true if your red blood cells have gotten low (anemic). The transfusion raises the level of the red blood cells which carry oxygen, and this usually causes an energy increase.   The nurse administering the transfusion will monitor you carefully for complications.  HOME CARE INSTRUCTIONS   No special instructions are needed after a transfusion. You may find your energy is better. Speak with your caregiver about any limitations on activity for underlying diseases you may have.  SEEK MEDICAL CARE IF:    Your condition is not improving after your transfusion.   You develop redness or irritation at the intravenous (IV) site.  SEEK IMMEDIATE MEDICAL CARE IF:   Any of the following symptoms occur over the next 12 hours:   Shaking chills.   You have a temperature by mouth above 102 F (38.9 C), not controlled by medicine.   Chest, back, or muscle pain.   People around you feel you are not acting correctly or are confused.   Shortness of breath or difficulty breathing.   Dizziness and fainting.     You get a rash or develop hives.   You have a decrease in urine output.   Your urine turns a dark color or changes to pink, red, or brown.  Any of the following symptoms occur over the next 10 days:   You have a temperature by mouth above 102 F (38.9 C), not controlled by medicine.   Shortness of breath.   Weakness after normal activity.   The white part of the eye turns yellow (jaundice).   You have a decrease in the amount of urine or are urinating less often.   Your  urine turns a dark color or changes to pink, red, or brown.  Document Released: 01/05/2000 Document Revised: 12/27/2010 Document Reviewed: 08/24/2007  [employer] Patient Information 2012 [employer].

## 2011-07-16 ENCOUNTER — Ambulatory Visit: Payer: Medicare Other

## 2011-07-16 ENCOUNTER — Other Ambulatory Visit: Payer: Medicare Other | Admitting: Lab

## 2011-07-16 LAB — TYPE AND SCREEN: Unit division: 0

## 2011-07-17 ENCOUNTER — Other Ambulatory Visit: Payer: Medicare Other | Admitting: Lab

## 2011-07-17 ENCOUNTER — Ambulatory Visit: Payer: Medicare Other

## 2011-07-18 ENCOUNTER — Ambulatory Visit: Payer: Medicare Other

## 2011-07-18 ENCOUNTER — Other Ambulatory Visit: Payer: Medicare Other | Admitting: Lab

## 2011-07-18 ENCOUNTER — Other Ambulatory Visit: Payer: Medicare Other

## 2011-07-18 ENCOUNTER — Telehealth: Payer: Self-pay | Admitting: *Deleted

## 2011-07-18 DIAGNOSIS — D469 Myelodysplastic syndrome, unspecified: Secondary | ICD-10-CM

## 2011-07-18 LAB — CBC WITH DIFFERENTIAL/PLATELET
Basophils Absolute: 0 10*3/uL (ref 0.0–0.1)
Eosinophils Absolute: 0 10*3/uL (ref 0.0–0.5)
HGB: 8.9 g/dL — ABNORMAL LOW (ref 11.6–15.9)
MCV: 90.5 fL (ref 79.5–101.0)
MONO#: 0.1 10*3/uL (ref 0.1–0.9)
NEUT#: 0.4 10*3/uL — CL (ref 1.5–6.5)
RDW: 13.1 % (ref 11.2–14.5)
WBC: 0.9 10*3/uL — CL (ref 3.9–10.3)
lymph#: 0.3 10*3/uL — ABNORMAL LOW (ref 0.9–3.3)

## 2011-07-18 NOTE — Telephone Encounter (Signed)
MD reviewed counts: Stop Cipro today. Follow up on 7/1 as scheduled with lab/MD and Procrit. Will probably not receive chemo 7/1. Patient notified. Will keep chemo injection on schedule just in case counts are improved.

## 2011-07-19 ENCOUNTER — Ambulatory Visit: Payer: Medicare Other

## 2011-07-19 ENCOUNTER — Other Ambulatory Visit: Payer: Medicare Other | Admitting: Lab

## 2011-07-22 ENCOUNTER — Ambulatory Visit: Payer: Medicare Other

## 2011-07-22 ENCOUNTER — Other Ambulatory Visit: Payer: Medicare Other | Admitting: Lab

## 2011-07-22 ENCOUNTER — Telehealth: Payer: Self-pay | Admitting: Oncology

## 2011-07-22 ENCOUNTER — Ambulatory Visit (HOSPITAL_BASED_OUTPATIENT_CLINIC_OR_DEPARTMENT_OTHER): Payer: Medicare Other | Admitting: Oncology

## 2011-07-22 VITALS — BP 134/68 | HR 85 | Temp 97.9°F | Ht 65.4 in | Wt 147.0 lb

## 2011-07-22 DIAGNOSIS — D709 Neutropenia, unspecified: Secondary | ICD-10-CM

## 2011-07-22 DIAGNOSIS — D649 Anemia, unspecified: Secondary | ICD-10-CM

## 2011-07-22 DIAGNOSIS — C9110 Chronic lymphocytic leukemia of B-cell type not having achieved remission: Secondary | ICD-10-CM

## 2011-07-22 DIAGNOSIS — D469 Myelodysplastic syndrome, unspecified: Secondary | ICD-10-CM

## 2011-07-22 LAB — CBC & DIFF AND RETIC
Basophils Absolute: 0 10*3/uL (ref 0.0–0.1)
Eosinophils Absolute: 0 10*3/uL (ref 0.0–0.5)
HCT: 23.7 % — ABNORMAL LOW (ref 34.8–46.6)
HGB: 8 g/dL — ABNORMAL LOW (ref 11.6–15.9)
LYMPH%: 23.5 % (ref 14.0–49.7)
MCV: 88.8 fL (ref 79.5–101.0)
MONO#: 0.1 10*3/uL (ref 0.1–0.9)
MONO%: 7.8 % (ref 0.0–14.0)
NEUT#: 0.8 10*3/uL — ABNORMAL LOW (ref 1.5–6.5)
Platelets: 37 10*3/uL — ABNORMAL LOW (ref 145–400)
WBC: 1.2 10*3/uL — ABNORMAL LOW (ref 3.9–10.3)

## 2011-07-22 LAB — HOLD TUBE, BLOOD BANK

## 2011-07-22 MED ORDER — EPOETIN ALFA 20000 UNIT/ML IJ SOLN
60000.0000 [IU] | Freq: Once | INTRAMUSCULAR | Status: AC
  Administered 2011-07-22: 60000 [IU] via SUBCUTANEOUS
  Filled 2011-07-22: qty 3

## 2011-07-22 NOTE — Progress Notes (Signed)
   [facility]    OFFICE PROGRESS NOTE   INTERVAL HISTORY:   She returns as scheduled. She was transfused with packed red blood cells on 07/15/2011 after the hemoglobin returned at 6.7. She reports feeling better after the red cell transfusion.  Anne Doyle continues weekly Procrit. She reports developing a sinus infection 2 weeks ago with a low-grade fever and mild chills. She had severe neutropenia 2 weeks ago and was placed on prophylactic ciprofloxacin.  Objective:  Vital signs in last 24 hours:  Blood pressure 134/68, pulse 85, temperature 97.9 F (36.6 C), temperature source Oral, height 5' 5.4" (1.661 m), weight 147 lb (66.679 kg).    HEENT: No thrush or ulcers. No bleeding. No sinus tenderness. Lymphatics: No cervical, supraclavicular, or axillary nodes Resp: Lungs clear bilaterally Cardio: Regular rate and rhythm  GI: No hepatosplenomegaly Vascular: No leg edema  Lab Results:  Lab Results  Component Value Date   WBC 1.2* 07/22/2011   HGB 8.0* 07/22/2011   HCT 23.7* 07/22/2011   MCV 88.8 07/22/2011   PLT 37* 07/22/2011   ANC 0.8    Medications: I have reviewed the patient's current medications.  Assessment/Plan: 1. Chronic lymphocytic leukemia, status post two cycles of fludarabine/rituximab with cycle #2 beginning on 05/08/2009. 2. Pancytopenia, status post a follow-up bone marrow biopsy, 09/01/2009, with involvement by CLL identified.  3. History of a prolonged bleeding time. A diagnostic evaluation including a von Willebrand panel and platelet function studies were negative. 4. Left breast ductal carcinoma in situ, status post a needle localized lumpectomy, 07/19/2008, followed by left breast radiation. She continues tamoxifen. 5. Chills during the Rituxan infusion, 04/05/2009. She was premedicated with Decadron at home prior to cycle #2. She tolerated cycle #2 without significant acute toxicity aside from upper chest and facial erythema. 6. Marked  neutropenia on labs, 11/22 and 12/19/2009, likely a delayed nadir related to rituximab. Resolved. 7. Macrocytic anemia-progressive. A laboratory evaluation on 01/14/2011 did not suggest hemolysis. A DAT was negative and the reticulocyte count was low on 01/28/2011. Bone marrow aspiration/biopsy 02/07/2011 showed a hypercellular marrow for age with dyspoietic changes primarily involving the erythroid and megakaryocytic cell lines. There was no morphologic or immunophenotypic evidence of a B-cell lymphoproliferative process. Cytogenetics returned normal. She began a trial of weekly Procrit on 02/25/2011. She was last transfused on 07/15/2011. -cycle 1 of 5 azacytidine started on 06/18/11 8. Severe neutropenia/progressive thrombocytopenia following a suicide in chemotherapy-the neutrophil count has improved,   Disposition:  She appears stable. Cycle 2 of 5-azacytidine remains on hold due to 2 persistent neutropenia and thrombocytopenia. She will return for a CBC and scheduled cycle 2 of 58 decided he in one week. We will continue to hold the 5-azacytidine if the neutrophils and platelets has not recovered further.  Anne Doyle continues weekly Procrit. She will receive transfusion support as needed. She will be scheduled for an office visit in approximately one month.   Thornton Papas, MD  07/22/2011  12:50 PM

## 2011-07-22 NOTE — Telephone Encounter (Signed)
Gaveb pt appt for July 2013 lab , injections and ML visit on 08/19/11

## 2011-07-23 ENCOUNTER — Ambulatory Visit: Payer: Medicare Other

## 2011-07-24 ENCOUNTER — Ambulatory Visit: Payer: Medicare Other

## 2011-07-26 ENCOUNTER — Ambulatory Visit: Payer: Medicare Other

## 2011-07-29 ENCOUNTER — Ambulatory Visit (HOSPITAL_BASED_OUTPATIENT_CLINIC_OR_DEPARTMENT_OTHER): Payer: Medicare Other

## 2011-07-29 ENCOUNTER — Other Ambulatory Visit (HOSPITAL_BASED_OUTPATIENT_CLINIC_OR_DEPARTMENT_OTHER): Payer: Medicare Other | Admitting: Lab

## 2011-07-29 VITALS — BP 126/81 | HR 82 | Temp 97.0°F

## 2011-07-29 DIAGNOSIS — D649 Anemia, unspecified: Secondary | ICD-10-CM

## 2011-07-29 DIAGNOSIS — D469 Myelodysplastic syndrome, unspecified: Secondary | ICD-10-CM

## 2011-07-29 LAB — CBC WITH DIFFERENTIAL/PLATELET
Eosinophils Absolute: 0 10*3/uL (ref 0.0–0.5)
LYMPH%: 19.2 % (ref 14.0–49.7)
MONO#: 0.2 10*3/uL (ref 0.1–0.9)
NEUT#: 1.4 10*3/uL — ABNORMAL LOW (ref 1.5–6.5)
Platelets: 62 10*3/uL — ABNORMAL LOW (ref 145–400)
RBC: 2.39 10*6/uL — ABNORMAL LOW (ref 3.70–5.45)
WBC: 1.9 10*3/uL — ABNORMAL LOW (ref 3.9–10.3)
lymph#: 0.4 10*3/uL — ABNORMAL LOW (ref 0.9–3.3)

## 2011-07-29 MED ORDER — EPOETIN ALFA 40000 UNIT/ML IJ SOLN
60000.0000 [IU] | Freq: Once | INTRAMUSCULAR | Status: AC
  Administered 2011-07-29: 60000 [IU] via SUBCUTANEOUS
  Filled 2011-07-29: qty 2

## 2011-07-29 NOTE — Progress Notes (Signed)
Labs reviewed with dr Truett Perna, vidaza held, procrit given.  Will not transfuse unless pt symptomatic.   (pt states she feels good, denies sob, dizziness, fatigue or any other symptoms.)  Blood band notified of cancell.  dmr

## 2011-07-30 ENCOUNTER — Ambulatory Visit: Payer: Medicare Other

## 2011-07-31 ENCOUNTER — Ambulatory Visit: Payer: Medicare Other

## 2011-08-01 ENCOUNTER — Ambulatory Visit: Payer: Medicare Other

## 2011-08-02 ENCOUNTER — Ambulatory Visit: Payer: Medicare Other

## 2011-08-02 NOTE — Progress Notes (Signed)
Vitals entered on wrong pt, mrs Iwai not in  Infusion room today.  dmr

## 2011-08-05 ENCOUNTER — Encounter (HOSPITAL_COMMUNITY)
Admission: RE | Admit: 2011-08-05 | Discharge: 2011-08-05 | Disposition: A | Discharge status: Home / Self Care | Payer: Medicare Other | Source: Ambulatory Visit | Attending: Oncology | Admitting: Oncology

## 2011-08-05 ENCOUNTER — Ambulatory Visit (HOSPITAL_BASED_OUTPATIENT_CLINIC_OR_DEPARTMENT_OTHER): Payer: Medicare Other

## 2011-08-05 ENCOUNTER — Other Ambulatory Visit: Payer: Self-pay | Admitting: Oncology

## 2011-08-05 ENCOUNTER — Telehealth: Payer: Self-pay | Admitting: *Deleted

## 2011-08-05 ENCOUNTER — Ambulatory Visit: Payer: Medicare Other

## 2011-08-05 ENCOUNTER — Other Ambulatory Visit (HOSPITAL_BASED_OUTPATIENT_CLINIC_OR_DEPARTMENT_OTHER): Payer: Medicare Other | Admitting: Lab

## 2011-08-05 ENCOUNTER — Other Ambulatory Visit: Payer: Self-pay | Admitting: *Deleted

## 2011-08-05 VITALS — BP 110/68 | HR 77 | Temp 97.2°F | Resp 20

## 2011-08-05 DIAGNOSIS — D649 Anemia, unspecified: Secondary | ICD-10-CM

## 2011-08-05 DIAGNOSIS — D469 Myelodysplastic syndrome, unspecified: Secondary | ICD-10-CM | POA: Insufficient documentation

## 2011-08-05 DIAGNOSIS — C9112 Chronic lymphocytic leukemia of B-cell type in relapse: Secondary | ICD-10-CM

## 2011-08-05 DIAGNOSIS — Z5111 Encounter for antineoplastic chemotherapy: Secondary | ICD-10-CM

## 2011-08-05 LAB — CBC WITH DIFFERENTIAL/PLATELET
BASO%: 0.5 % (ref 0.0–2.0)
Eosinophils Absolute: 0 10*3/uL (ref 0.0–0.5)
LYMPH%: 18.2 % (ref 14.0–49.7)
MCHC: 33.1 g/dL (ref 31.5–36.0)
MONO#: 0.1 10*3/uL (ref 0.1–0.9)
NEUT#: 1.4 10*3/uL — ABNORMAL LOW (ref 1.5–6.5)
Platelets: 63 10*3/uL — ABNORMAL LOW (ref 145–400)
RBC: 1.93 10*6/uL — ABNORMAL LOW (ref 3.70–5.45)
RDW: 12.9 % (ref 11.2–14.5)
WBC: 1.9 10*3/uL — ABNORMAL LOW (ref 3.9–10.3)
nRBC: 0 % (ref 0–0)

## 2011-08-05 MED ORDER — PEGFILGRASTIM INJECTION 6 MG/0.6ML: 6.0000 mg | Freq: Once | SUBCUTANEOUS | Status: DC

## 2011-08-05 MED ORDER — ONDANSETRON HCL 8 MG PO TABS
8.0000 mg | ORAL_TABLET | Freq: Once | ORAL | Status: AC
  Administered 2011-08-05: 8 mg via ORAL

## 2011-08-05 MED ORDER — AZACITIDINE CHEMO SQ INJECTION
50.0000 mg/m2 | Freq: Once | INTRAMUSCULAR | Status: AC
  Administered 2011-08-05: 87.5 mg via SUBCUTANEOUS
  Filled 2011-08-05: qty 17.5

## 2011-08-05 MED ORDER — SODIUM CHLORIDE 0.9 % IV SOLN: 250.0000 mL | Freq: Once | INTRAVENOUS | Status: DC

## 2011-08-05 MED ORDER — FLUCONAZOLE 150 MG PO TABS: 150.0000 mg | ORAL_TABLET | Freq: Once | ORAL | Status: AC

## 2011-08-05 MED ORDER — EPOETIN ALFA 20000 UNIT/ML IJ SOLN
60000.0000 [IU] | Freq: Once | INTRAMUSCULAR | Status: AC
  Administered 2011-08-05: 60000 [IU] via SUBCUTANEOUS
  Filled 2011-08-05: qty 3

## 2011-08-05 NOTE — Telephone Encounter (Signed)
Per pharmacy I have scheduled patient for injection the rest of the week. JMW

## 2011-08-05 NOTE — Progress Notes (Signed)
Reports vaginal yeast infection that has not responded well to  7 day OTC medication. Will order dose of 150 mg Diflucan per Dr. Truett Perna.

## 2011-08-06 ENCOUNTER — Ambulatory Visit (HOSPITAL_BASED_OUTPATIENT_CLINIC_OR_DEPARTMENT_OTHER): Payer: Medicare Other

## 2011-08-06 VITALS — BP 131/70 | HR 81 | Temp 99.8°F

## 2011-08-06 DIAGNOSIS — D469 Myelodysplastic syndrome, unspecified: Secondary | ICD-10-CM

## 2011-08-06 DIAGNOSIS — Z5111 Encounter for antineoplastic chemotherapy: Secondary | ICD-10-CM

## 2011-08-06 LAB — TYPE AND SCREEN
ABO/RH(D): O POS
Unit division: 0
Unit division: 0

## 2011-08-06 LAB — HOLD TUBE, BLOOD BANK

## 2011-08-06 MED ORDER — AZACITIDINE CHEMO SQ INJECTION
50.0000 mg/m2 | Freq: Once | INTRAMUSCULAR | Status: AC
  Administered 2011-08-06: 87.5 mg via SUBCUTANEOUS
  Filled 2011-08-06: qty 17.5

## 2011-08-06 MED ORDER — ONDANSETRON HCL 8 MG PO TABS
8.0000 mg | ORAL_TABLET | Freq: Once | ORAL | Status: AC
  Administered 2011-08-06: 8 mg via ORAL

## 2011-08-06 NOTE — Patient Instructions (Signed)
[facility] Discharge Instructions for Patients Receiving Chemotherapy  Today you received the following chemotherapy agents VIDAZA To help prevent nausea and vomiting after your treatment, we encourage you to take your nausea medication   Take it as often as prescribed.   If you develop nausea and vomiting that is not controlled by your nausea medication, call the clinic. If it is after clinic hours your family physician or the after hours number for the clinic or go to the Emergency Department.   BELOW ARE SYMPTOMS THAT SHOULD BE REPORTED IMMEDIATELY:  *FEVER GREATER THAN 100.5 F  *CHILLS WITH OR WITHOUT FEVER  NAUSEA AND VOMITING THAT IS NOT CONTROLLED WITH YOUR NAUSEA MEDICATION  *UNUSUAL SHORTNESS OF BREATH  *UNUSUAL BRUISING OR BLEEDING  TENDERNESS IN MOUTH AND THROAT WITH OR WITHOUT PRESENCE OF ULCERS  *URINARY PROBLEMS  *BOWEL PROBLEMS  UNUSUAL RASH Items with * indicate a potential emergency and should be followed up as soon as possible.  If this is your first treatment one of the nurses will contact you 24 hours after your treatment. Please let the nurse know about any problems that you may have experienced. Feel free to call the clinic you have any questions or concerns. The clinic phone number is [phone].   I have been informed and understand all the instructions given to me. I know to contact the clinic, my physician, or go to the Emergency Department if any problems should occur. I do not have any questions at this time, but understand that I may call the clinic during office hours   should I have any questions or need assistance in obtaining follow up care.    __________________________________________  _____________  __________ Signature of Patient or Authorized Representative            Date                   Time    __________________________________________ Nurse's Signature     

## 2011-08-07 ENCOUNTER — Ambulatory Visit (HOSPITAL_BASED_OUTPATIENT_CLINIC_OR_DEPARTMENT_OTHER): Payer: Medicare Other

## 2011-08-07 ENCOUNTER — Inpatient Hospital Stay: Payer: Medicare Other

## 2011-08-07 VITALS — BP 149/82 | HR 84 | Temp 98.4°F

## 2011-08-07 DIAGNOSIS — Z5111 Encounter for antineoplastic chemotherapy: Secondary | ICD-10-CM

## 2011-08-07 DIAGNOSIS — D469 Myelodysplastic syndrome, unspecified: Secondary | ICD-10-CM

## 2011-08-07 MED ORDER — ONDANSETRON HCL 8 MG PO TABS
8.0000 mg | ORAL_TABLET | Freq: Once | ORAL | Status: AC
  Administered 2011-08-07: 8 mg via ORAL

## 2011-08-07 MED ORDER — AZACITIDINE CHEMO SQ INJECTION
50.0000 mg/m2 | Freq: Once | INTRAMUSCULAR | Status: AC
  Administered 2011-08-07: 87.5 mg via SUBCUTANEOUS
  Filled 2011-08-07: qty 17.5

## 2011-08-07 NOTE — Progress Notes (Signed)
On 08/06/11 per Dr Truett Perna it is okay to treat pt with vidaza and labs from 08/05/11 and fever 99.Denver Faster, RN

## 2011-08-07 NOTE — Patient Instructions (Signed)
[facility] Discharge Instructions for Patients Receiving Chemotherapy  Today you received the following chemotherapy agents Vidaza  To help prevent nausea and vomiting after your treatment, we encourage you to take your nausea medication as prescribed.    If you develop nausea and vomiting that is not controlled by your nausea medication, call the clinic. If it is after clinic hours your family physician or the after hours number for the clinic or go to the Emergency Department.   BELOW ARE SYMPTOMS THAT SHOULD BE REPORTED IMMEDIATELY:  *FEVER GREATER THAN 100.5 F  *CHILLS WITH OR WITHOUT FEVER  NAUSEA AND VOMITING THAT IS NOT CONTROLLED WITH YOUR NAUSEA MEDICATION  *UNUSUAL SHORTNESS OF BREATH  *UNUSUAL BRUISING OR BLEEDING  TENDERNESS IN MOUTH AND THROAT WITH OR WITHOUT PRESENCE OF ULCERS  *URINARY PROBLEMS  *BOWEL PROBLEMS  UNUSUAL RASH Items with * indicate a potential emergency and should be followed up as soon as possible.  One of the nurses will contact you 24 hours after your treatment. Please let the nurse know about any problems that you may have experienced. Feel free to call the clinic you have any questions or concerns. The clinic phone number is [phone].   I have been informed and understand all the instructions given to me. I know to contact the clinic, my physician, or go to the Emergency Department if any problems should occur. I do not have any questions at this time, but understand that I may call the clinic during office hours   should I have any questions or need assistance in obtaining follow up care.    __________________________________________  _____________  __________ Signature of Patient or Authorized Representative            Date                   Time    __________________________________________ Nurse's Signature    

## 2011-08-08 ENCOUNTER — Ambulatory Visit (HOSPITAL_BASED_OUTPATIENT_CLINIC_OR_DEPARTMENT_OTHER): Payer: Medicare Other

## 2011-08-08 ENCOUNTER — Inpatient Hospital Stay: Payer: Medicare Other

## 2011-08-08 VITALS — BP 149/73 | HR 91 | Temp 98.8°F

## 2011-08-08 DIAGNOSIS — Z5111 Encounter for antineoplastic chemotherapy: Secondary | ICD-10-CM

## 2011-08-08 DIAGNOSIS — D469 Myelodysplastic syndrome, unspecified: Secondary | ICD-10-CM

## 2011-08-08 MED ORDER — ONDANSETRON HCL 8 MG PO TABS
8.0000 mg | ORAL_TABLET | Freq: Once | ORAL | Status: AC
  Administered 2011-08-08: 8 mg via ORAL

## 2011-08-08 MED ORDER — AZACITIDINE CHEMO SQ INJECTION
50.0000 mg/m2 | Freq: Once | INTRAMUSCULAR | Status: AC
  Administered 2011-08-08: 87.5 mg via SUBCUTANEOUS
  Filled 2011-08-08: qty 17.5

## 2011-08-08 NOTE — Patient Instructions (Addendum)
[facility] Discharge Instructions for Patients Receiving Chemotherapy  Today you received the following chemotherapy agents Vidaza.  To help prevent nausea and vomiting after your treatment, we encourage you to take your nausea medication as ordered per MD. .   If you develop nausea and vomiting that is not controlled by your nausea medication, call the clinic. If it is after clinic hours your family physician or the after hours number for the clinic or go to the Emergency Department.   BELOW ARE SYMPTOMS THAT SHOULD BE REPORTED IMMEDIATELY:  *FEVER GREATER THAN 100.5 F  *CHILLS WITH OR WITHOUT FEVER  NAUSEA AND VOMITING THAT IS NOT CONTROLLED WITH YOUR NAUSEA MEDICATION  *UNUSUAL SHORTNESS OF BREATH  *UNUSUAL BRUISING OR BLEEDING  TENDERNESS IN MOUTH AND THROAT WITH OR WITHOUT PRESENCE OF ULCERS  *URINARY PROBLEMS  *BOWEL PROBLEMS  UNUSUAL RASH Items with * indicate a potential emergency and should be followed up as soon as possible.   Please let the nurse know about any problems that you may have experienced. Feel free to call the clinic you have any questions or concerns. The clinic phone number is [phone].   I have been informed and understand all the instructions given to me. I know to contact the clinic, my physician, or go to the Emergency Department if any problems should occur. I do not have any questions at this time, but understand that I may call the clinic during office hours   should I have any questions or need assistance in obtaining follow up care.    __________________________________________  _____________  __________ Signature of Patient or Authorized Representative            Date                   Time    __________________________________________ Nurse's Signature    

## 2011-08-09 ENCOUNTER — Inpatient Hospital Stay: Payer: Medicare Other

## 2011-08-09 ENCOUNTER — Ambulatory Visit (HOSPITAL_BASED_OUTPATIENT_CLINIC_OR_DEPARTMENT_OTHER): Payer: Medicare Other

## 2011-08-09 VITALS — BP 134/67 | HR 82 | Temp 98.1°F

## 2011-08-09 DIAGNOSIS — D469 Myelodysplastic syndrome, unspecified: Secondary | ICD-10-CM

## 2011-08-09 DIAGNOSIS — Z5111 Encounter for antineoplastic chemotherapy: Secondary | ICD-10-CM

## 2011-08-09 MED ORDER — ONDANSETRON HCL 8 MG PO TABS
8.0000 mg | ORAL_TABLET | Freq: Once | ORAL | Status: AC
  Administered 2011-08-09: 8 mg via ORAL

## 2011-08-09 MED ORDER — AZACITIDINE CHEMO SQ INJECTION
50.0000 mg/m2 | Freq: Once | INTRAMUSCULAR | Status: AC
  Administered 2011-08-09: 87.5 mg via SUBCUTANEOUS
  Filled 2011-08-09: qty 17.5

## 2011-08-09 NOTE — Patient Instructions (Signed)
Azacitidine suspension for injection (subcutaneous use) What is this medicine? AZACITIDINE (ay za SITE i deen) is a chemotherapy drug. This medicine reduces the growth of cancer cells and can suppress the immune system. It is used for treating myelodysplastic syndrome or some types of leukemia. This medicine may be used for other purposes; ask your health care provider or pharmacist if you have questions. What should I tell my health care provider before I take this medicine? They need to know if you have any of these conditions: -infection (especially a virus infection such as chickenpox, cold sores, or herpes) -kidney disease -liver disease -liver tumors -an unusual or allergic reaction to azacitidine, mannitol, other medicines, foods, dyes, or preservatives -pregnant or trying to get pregnant -breast-feeding How should I use this medicine? This medicine is for injection under the skin. It is administered in a hospital or clinic by a specially trained health care professional. Talk to your pediatrician regarding the use of this medicine in children. While this drug may be prescribed for selected conditions, precautions do apply. Overdosage: If you think you have taken too much of this medicine contact a poison control center or emergency room at once. NOTE: This medicine is only for you. Do not share this medicine with others. What if I miss a dose? It is important not to miss your dose. Call your doctor or health care professional if you are unable to keep an appointment. What may interact with this medicine? -vaccines Talk to your doctor or health care professional before taking any of these medicines: -acetaminophen -aspirin -ibuprofen -ketoprofen -naproxen This list may not describe all possible interactions. Give your health care provider a list of all the medicines, herbs, non-prescription drugs, or dietary supplements you use. Also tell them if you smoke, drink alcohol, or use  illegal drugs. Some items may interact with your medicine. What should I watch for while using this medicine? Visit your doctor for checks on your progress. This drug may make you feel generally unwell. This is not uncommon, as chemotherapy can affect healthy cells as well as cancer cells. Report any side effects. Continue your course of treatment even though you feel ill unless your doctor tells you to stop. In some cases, you may be given additional medicines to help with side effects. Follow all directions for their use. Call your doctor or health care professional for advice if you get a fever, chills or sore throat, or other symptoms of a cold or flu. Do not treat yourself. This drug decreases your body's ability to fight infections. Try to avoid being around people who are sick. This medicine may increase your risk to bruise or bleed. Call your doctor or health care professional if you notice any unusual bleeding. Be careful brushing and flossing your teeth or using a toothpick because you may get an infection or bleed more easily. If you have any dental work done, tell your dentist you are receiving this medicine. Avoid taking products that contain aspirin, acetaminophen, ibuprofen, naproxen, or ketoprofen unless instructed by your doctor. These medicines may hide a fever. Do not have any vaccinations without your doctor's approval and avoid anyone who has recently had oral polio vaccine. Do not become pregnant while taking this medicine. Women should inform their doctor if they wish to become pregnant or think they might be pregnant. There is a potential for serious side effects to an unborn child. Talk to your health care professional or pharmacist for more information. Do not breast-feed an   infant while taking this medicine. If you are a man, you should not father a child while receiving treatment. What side effects may I notice from receiving this medicine? Side effects that you should report  to your doctor or health care professional as soon as possible: -allergic reactions like skin rash, itching or hives, swelling of the face, lips, or tongue -low blood counts - this medicine may decrease the number of white blood cells, red blood cells and platelets. You may be at increased risk for infections and bleeding. -signs of infection - fever or chills, cough, sore throat, pain or difficulty passing urine -signs of decreased platelets or bleeding - bruising, pinpoint red spots on the skin, black, tarry stools, blood in the urine -signs of decreased red blood cells - unusually weak or tired, fainting spells, lightheadedness -reactions at the injection site including redness, pain, itching, or bruising -breathing problems -changes in vision -fever -mouth sores -stomach pain -vomiting Side effects that usually do not require medical attention (report to your doctor or health care professional if they continue or are bothersome): -constipation -diarrhea -loss of appetite -nausea -pain or redness at the injection site -weak or tired This list may not describe all possible side effects. Call your doctor for medical advice about side effects. You may report side effects to FDA at 1-800-FDA-1088. Where should I keep my medicine? This drug is given in a hospital or clinic and will not be stored at home. NOTE: This sheet is a summary. It may not cover all possible information. If you have questions about this medicine, talk to your doctor, pharmacist, or health care provider.  2012, [employer]. (04/02/2007 11:04:07 AM) 

## 2011-08-10 ENCOUNTER — Ambulatory Visit (HOSPITAL_BASED_OUTPATIENT_CLINIC_OR_DEPARTMENT_OTHER): Payer: Medicare Other

## 2011-08-10 VITALS — BP 132/75 | HR 84 | Temp 97.7°F

## 2011-08-10 DIAGNOSIS — C9112 Chronic lymphocytic leukemia of B-cell type in relapse: Secondary | ICD-10-CM

## 2011-08-10 DIAGNOSIS — D469 Myelodysplastic syndrome, unspecified: Secondary | ICD-10-CM

## 2011-08-10 DIAGNOSIS — Z5189 Encounter for other specified aftercare: Secondary | ICD-10-CM

## 2011-08-10 MED ORDER — PEGFILGRASTIM INJECTION 6 MG/0.6ML
6.0000 mg | Freq: Once | SUBCUTANEOUS | Status: AC
  Administered 2011-08-10: 6 mg via SUBCUTANEOUS

## 2011-08-12 ENCOUNTER — Ambulatory Visit (HOSPITAL_BASED_OUTPATIENT_CLINIC_OR_DEPARTMENT_OTHER): Payer: Medicare Other

## 2011-08-12 ENCOUNTER — Other Ambulatory Visit (HOSPITAL_BASED_OUTPATIENT_CLINIC_OR_DEPARTMENT_OTHER): Payer: Medicare Other | Admitting: Lab

## 2011-08-12 VITALS — BP 131/62 | HR 99 | Temp 97.3°F

## 2011-08-12 DIAGNOSIS — D649 Anemia, unspecified: Secondary | ICD-10-CM

## 2011-08-12 DIAGNOSIS — D469 Myelodysplastic syndrome, unspecified: Secondary | ICD-10-CM

## 2011-08-12 LAB — CBC WITH DIFFERENTIAL/PLATELET
BASO%: 0.4 % (ref 0.0–2.0)
Basophils Absolute: 0 10*3/uL (ref 0.0–0.1)
EOS%: 0.9 % (ref 0.0–7.0)
HCT: 23.4 % — ABNORMAL LOW (ref 34.8–46.6)
HGB: 8 g/dL — ABNORMAL LOW (ref 11.6–15.9)
LYMPH%: 6.2 % — ABNORMAL LOW (ref 14.0–49.7)
MCH: 30.4 pg (ref 25.1–34.0)
MCHC: 34.1 g/dL (ref 31.5–36.0)
MCV: 89.1 fL (ref 79.5–101.0)
MONO%: 4.5 % (ref 0.0–14.0)
NEUT%: 88 % — ABNORMAL HIGH (ref 38.4–76.8)
Platelets: 42 10*3/uL — ABNORMAL LOW (ref 145–400)
lymph#: 0.4 10*3/uL — ABNORMAL LOW (ref 0.9–3.3)

## 2011-08-12 MED ORDER — EPOETIN ALFA 20000 UNIT/ML IJ SOLN
60000.0000 [IU] | Freq: Once | INTRAMUSCULAR | Status: AC
  Administered 2011-08-12: 60000 [IU] via SUBCUTANEOUS
  Filled 2011-08-12: qty 3

## 2011-08-19 ENCOUNTER — Other Ambulatory Visit (HOSPITAL_BASED_OUTPATIENT_CLINIC_OR_DEPARTMENT_OTHER): Payer: Medicare Other | Admitting: Lab

## 2011-08-19 ENCOUNTER — Telehealth: Payer: Self-pay | Admitting: Nurse Practitioner

## 2011-08-19 ENCOUNTER — Other Ambulatory Visit: Payer: Self-pay | Admitting: *Deleted

## 2011-08-19 ENCOUNTER — Ambulatory Visit (HOSPITAL_COMMUNITY)
Admission: RE | Admit: 2011-08-19 | Discharge: 2011-08-19 | Disposition: A | Discharge status: Home / Self Care | Payer: Medicare Other | Source: Ambulatory Visit | Attending: Nurse Practitioner | Admitting: Nurse Practitioner

## 2011-08-19 ENCOUNTER — Ambulatory Visit (HOSPITAL_BASED_OUTPATIENT_CLINIC_OR_DEPARTMENT_OTHER): Payer: Medicare Other | Admitting: Nurse Practitioner

## 2011-08-19 ENCOUNTER — Telehealth: Payer: Self-pay | Admitting: Oncology

## 2011-08-19 ENCOUNTER — Ambulatory Visit: Payer: Medicare Other

## 2011-08-19 VITALS — BP 139/78 | HR 92 | Temp 98.0°F | Ht 65.0 in | Wt 147.9 lb

## 2011-08-19 DIAGNOSIS — D469 Myelodysplastic syndrome, unspecified: Secondary | ICD-10-CM

## 2011-08-19 DIAGNOSIS — D649 Anemia, unspecified: Secondary | ICD-10-CM

## 2011-08-19 DIAGNOSIS — R05 Cough: Secondary | ICD-10-CM

## 2011-08-19 DIAGNOSIS — C9110 Chronic lymphocytic leukemia of B-cell type not having achieved remission: Secondary | ICD-10-CM

## 2011-08-19 DIAGNOSIS — D709 Neutropenia, unspecified: Secondary | ICD-10-CM

## 2011-08-19 DIAGNOSIS — Z853 Personal history of malignant neoplasm of breast: Secondary | ICD-10-CM | POA: Insufficient documentation

## 2011-08-19 DIAGNOSIS — R059 Cough, unspecified: Secondary | ICD-10-CM | POA: Insufficient documentation

## 2011-08-19 DIAGNOSIS — D0590 Unspecified type of carcinoma in situ of unspecified breast: Secondary | ICD-10-CM

## 2011-08-19 LAB — CBC WITH DIFFERENTIAL/PLATELET
BASO%: 0.7 % (ref 0.0–2.0)
Basophils Absolute: 0 10*3/uL (ref 0.0–0.1)
EOS%: 2.2 % (ref 0.0–7.0)
HGB: 6.2 g/dL — CL (ref 11.6–15.9)
MCH: 29.9 pg (ref 25.1–34.0)
MCHC: 33.6 g/dL (ref 31.5–36.0)
MCV: 89.2 fL (ref 79.5–101.0)
MONO%: 1.1 % (ref 0.0–14.0)
RDW: 12.7 % (ref 11.2–14.5)

## 2011-08-19 LAB — PREPARE RBC (CROSSMATCH)

## 2011-08-19 MED ORDER — HYDROCODONE-HOMATROPINE 5-1.5 MG/5ML PO SYRP: 5.0000 mL | ORAL_SOLUTION | Freq: Four times a day (QID) | ORAL | Status: DC | PRN

## 2011-08-19 MED ORDER — EPOETIN ALFA 40000 UNIT/ML IJ SOLN
60000.0000 [IU] | Freq: Once | INTRAMUSCULAR | Status: AC
  Administered 2011-08-19: 60000 [IU] via SUBCUTANEOUS
  Filled 2011-08-19: qty 2

## 2011-08-19 NOTE — Progress Notes (Signed)
OFFICE PROGRESS NOTE  Interval history:  [name] returns as scheduled. She completed cycle 2 5-azacytidine beginning 08/05/2011. She feels she needs a blood transfusion. Energy level is decreased. She reports a several day history of a sore throat and cough. The cough is productive with green phlegm expectorated. She denies fever.  No shaking chills. She has mild dyspnea on exertion.   Objective: Blood pressure 139/78, pulse 92, temperature 98 F (36.7 C), temperature source Oral, height 5\' 5"  (1.651 m), weight 147 lb 14.4 oz (67.087 kg).  Oropharynx is without thrush or ulceration. Scattered wheezes at the upper lungs bilaterally. Rhonchi at the bases bilaterally. Regular cardiac rhythm. Abdomen is soft and nontender. No organomegaly. Extremities are without edema.   Lab Results: Lab Results  Component Value Date   WBC 2.8* 08/19/2011   HGB 6.2* 08/19/2011   HCT 18.6* 08/19/2011   MCV 89.2 08/19/2011   PLT 37* 08/19/2011    Chemistry:    Chemistry      Component Value Date/Time   NA 140 07/15/2011 0947   K 3.5 07/15/2011 0947   CL 104 07/15/2011 0947   CO2 27 07/15/2011 0947   BUN 19 07/15/2011 0947   CREATININE 0.78 07/15/2011 0947      Component Value Date/Time   CALCIUM 8.8 07/15/2011 0947   ALKPHOS 70 07/15/2011 0947   AST 19 07/15/2011 0947   ALT 24 07/15/2011 0947   BILITOT 0.9 07/15/2011 0947       Studies/Results: No results found.  Medications: I have reviewed the patient's current medications.  Assessment/Plan:  1. Chronic lymphocytic leukemia, status post two cycles of fludarabine/rituximab with cycle #2 beginning on 05/08/2009. 2. Pancytopenia, status post a follow-up bone marrow biopsy, 09/01/2009, with involvement by CLL identified.  3. History of a prolonged bleeding time. A diagnostic evaluation including a von Willebrand panel and platelet function studies were negative. 4. Left breast ductal carcinoma in situ, status post a needle localized lumpectomy,  07/19/2008, followed by left breast radiation. She continues tamoxifen. 5. Chills during the Rituxan infusion, 04/05/2009. She was premedicated with Decadron at home prior to cycle #2. She tolerated cycle #2 without significant acute toxicity aside from upper chest and facial erythema. 6. Marked neutropenia on labs, 11/22 and 12/19/2009, likely a delayed nadir related to rituximab. Resolved. 7. Macrocytic anemia-progressive. A laboratory evaluation on 01/14/2011 did not suggest hemolysis. A DAT was negative and the reticulocyte count was low on 01/28/2011. Bone marrow aspiration/biopsy 02/07/2011 showed a hypercellular marrow for age with dyspoietic changes primarily involving the erythroid and megakaryocytic cell lines. There was no morphologic or immunophenotypic evidence of a B-cell lymphoproliferative process. Cytogenetics returned normal. She began a trial of weekly Procrit on 02/25/2011. She was last transfused on 07/15/2011. Cycle 1 of 5 azacytidine started on 06/18/11. Cycle 2 of 5-azacytidine at a reduced dose beginning 08/05/2011. She received Neulasta support with cycle 2. 8. Severe neutropenia/progressive thrombocytopenia following cycle one 5-azacytidine.  9. Cough and sore throat with abnormal lung exam. We are referring her for a chest x-ray. She was given a prescription for Hycodan cough syrup. She will contact the office with fever, shaking chills or worsening of current symptoms.  Disposition-[name] appears stable. She completed cycle 2 5-azacytidine beginning 08/05/2011. She has progressive anemia. She is scheduled for a blood transfusion on 08/22/2011. She continues weekly Procrit. She will return for a followup visit in 2 weeks. She will contact the office in the interim as outlined above or with any other problems.  Plan reviewed with Dr. Truett Perna.  Lonna Cobb ANP/GNP-BC

## 2011-08-19 NOTE — Telephone Encounter (Signed)
Notified Ms. Goranson chest x-ray was negative for evidence of pneumonia. She was instructed to contact the office with persistent/worsening symptoms or new symptoms such as fever.

## 2011-08-19 NOTE — Telephone Encounter (Signed)
Gave pt appt for August 2013 lab , injection, chemo and MD

## 2011-08-21 ENCOUNTER — Other Ambulatory Visit: Payer: Self-pay | Admitting: *Deleted

## 2011-08-22 ENCOUNTER — Encounter (HOSPITAL_COMMUNITY)
Admission: RE | Admit: 2011-08-22 | Discharge: 2011-08-22 | Disposition: A | Discharge status: Home / Self Care | Payer: Medicare Other | Source: Ambulatory Visit | Attending: Oncology | Admitting: Oncology

## 2011-08-22 ENCOUNTER — Other Ambulatory Visit (HOSPITAL_BASED_OUTPATIENT_CLINIC_OR_DEPARTMENT_OTHER): Payer: Self-pay | Admitting: *Deleted

## 2011-08-22 ENCOUNTER — Other Ambulatory Visit: Payer: Medicare Other | Admitting: Lab

## 2011-08-22 ENCOUNTER — Other Ambulatory Visit: Payer: Self-pay | Admitting: *Deleted

## 2011-08-22 ENCOUNTER — Ambulatory Visit (HOSPITAL_BASED_OUTPATIENT_CLINIC_OR_DEPARTMENT_OTHER): Payer: Medicare Other

## 2011-08-22 VITALS — BP 134/65 | HR 100 | Temp 97.0°F | Resp 16

## 2011-08-22 DIAGNOSIS — D649 Anemia, unspecified: Secondary | ICD-10-CM

## 2011-08-22 DIAGNOSIS — R05 Cough: Secondary | ICD-10-CM

## 2011-08-22 DIAGNOSIS — D469 Myelodysplastic syndrome, unspecified: Secondary | ICD-10-CM

## 2011-08-22 MED ORDER — HYDROCODONE-HOMATROPINE 5-1.5 MG/5ML PO SYRP: 5.0000 mL | ORAL_SOLUTION | Freq: Four times a day (QID) | ORAL | Status: DC | PRN

## 2011-08-22 MED ORDER — SODIUM CHLORIDE 0.9 % IV SOLN
250.0000 mL | Freq: Once | INTRAVENOUS | Status: AC
  Administered 2011-08-22: 250 mL via INTRAVENOUS

## 2011-08-22 MED ORDER — SODIUM CHLORIDE 0.9 % IJ SOLN
10.0000 mL | INTRAMUSCULAR | Status: DC | PRN
  Filled 2011-08-22: qty 10

## 2011-08-22 NOTE — Patient Instructions (Addendum)
Blood Products Information This is information about transfusions of blood products. All blood that is to be transfused is tested for blood type, compatibility with the recipient, and for infections. Except in emergencies, giving a transfusion requires a written consent. Blood transfusions are often given as packed red blood cells. This means the other parts of the blood have been taken out. Blood may be needed to treat severe anemia or bleeding. Other blood products include plasma, platelets, immune globulin, and cryoprecipitate. Blood for transfusion is mostly donated by volunteers. The blood donors are carefully screened for risk factors that could cause disease. Donors are all tested for infections that could be transmitted by blood. The blood product supply today is the safest it has ever been. Some risks do remain.  A minor reaction with fever, chills, or rash happens in about 1% of blood product transfusions.   Life-threatening reactions occur in less than 1 in a million transfusions.   Infection with germs (bacteria), viruses or parasites like malaria can still happen. The risk is very low.   Hepatitis B occurs in about 1 case in 150,000 transfusions.   Hepatitis C is seen once in 500,000.   HIV is transmitted less than once every million transfusions.  When you receive a transfusion of packed red blood cells, your blood is tested for blood group and Rh type. Your blood is also screened for antibodies that could cause a serious reaction. A cross-match test is done to make sure the blood is safe to give.  Talk with your caregiver if you have any concerns about receiving a transfusion of blood products. Make sure your questions are answered. Transfusions are not given if your caregiver feels the risk is greater than the need. Document Released: 01/07/2005 Document Revised: 12/27/2010 Document Reviewed: 06/27/2006 ExitCare Patient Information 2012 [employer]. 

## 2011-08-23 LAB — TYPE AND SCREEN
ABO/RH(D): O POS
Antibody Screen: NEGATIVE
Unit division: 0
Unit division: 0

## 2011-08-26 ENCOUNTER — Ambulatory Visit (HOSPITAL_BASED_OUTPATIENT_CLINIC_OR_DEPARTMENT_OTHER): Payer: Medicare Other

## 2011-08-26 ENCOUNTER — Other Ambulatory Visit (HOSPITAL_BASED_OUTPATIENT_CLINIC_OR_DEPARTMENT_OTHER): Payer: Medicare Other | Admitting: Lab

## 2011-08-26 VITALS — BP 160/94 | HR 102 | Temp 98.1°F | Resp 20

## 2011-08-26 DIAGNOSIS — D649 Anemia, unspecified: Secondary | ICD-10-CM

## 2011-08-26 DIAGNOSIS — D469 Myelodysplastic syndrome, unspecified: Secondary | ICD-10-CM

## 2011-08-26 LAB — CBC WITH DIFFERENTIAL/PLATELET
BASO%: 1.7 % (ref 0.0–2.0)
LYMPH%: 22.2 % (ref 14.0–49.7)
MCHC: 33.8 g/dL (ref 31.5–36.0)
MONO#: 0 10*3/uL — ABNORMAL LOW (ref 0.1–0.9)
Platelets: 52 10*3/uL — ABNORMAL LOW (ref 145–400)
RBC: 2.67 10*6/uL — ABNORMAL LOW (ref 3.70–5.45)
RDW: 12.8 % (ref 11.2–14.5)
WBC: 1.4 10*3/uL — ABNORMAL LOW (ref 3.9–10.3)

## 2011-08-26 MED ORDER — EPOETIN ALFA 40000 UNIT/ML IJ SOLN
60000.0000 [IU] | Freq: Once | INTRAMUSCULAR | Status: AC
  Administered 2011-08-26: 60000 [IU] via SUBCUTANEOUS
  Filled 2011-08-26: qty 2

## 2011-08-30 ENCOUNTER — Telehealth: Payer: Self-pay | Admitting: *Deleted

## 2011-08-30 MED ORDER — AZITHROMYCIN 250 MG PO TABS: ORAL_TABLET | ORAL | Status: DC

## 2011-08-30 NOTE — Telephone Encounter (Signed)
Call from pt reporting cough has gotten deeper, productive- green sputum. Temp 99.6. Denies dyspnea.  Reviewed with Misty Stanley, NP. Order received for ZPack. Pt made aware.

## 2011-09-01 ENCOUNTER — Other Ambulatory Visit: Payer: Self-pay | Admitting: Oncology

## 2011-09-01 DIAGNOSIS — D469 Myelodysplastic syndrome, unspecified: Secondary | ICD-10-CM

## 2011-09-01 MED ORDER — PEGFILGRASTIM INJECTION 6 MG/0.6ML: 6.0000 mg | Freq: Once | SUBCUTANEOUS | Status: DC

## 2011-09-02 ENCOUNTER — Ambulatory Visit: Payer: Medicare Other | Admitting: Lab

## 2011-09-02 ENCOUNTER — Telehealth: Payer: Self-pay | Admitting: *Deleted

## 2011-09-02 ENCOUNTER — Ambulatory Visit: Payer: Medicare Other

## 2011-09-02 ENCOUNTER — Telehealth: Payer: Self-pay | Admitting: Oncology

## 2011-09-02 ENCOUNTER — Other Ambulatory Visit (HOSPITAL_BASED_OUTPATIENT_CLINIC_OR_DEPARTMENT_OTHER): Payer: Medicare Other | Admitting: Lab

## 2011-09-02 ENCOUNTER — Ambulatory Visit (HOSPITAL_BASED_OUTPATIENT_CLINIC_OR_DEPARTMENT_OTHER): Payer: Medicare Other | Admitting: Oncology

## 2011-09-02 ENCOUNTER — Other Ambulatory Visit: Payer: Self-pay | Admitting: *Deleted

## 2011-09-02 VITALS — BP 163/80 | HR 102 | Temp 98.4°F | Resp 18 | Ht 65.0 in | Wt 146.7 lb

## 2011-09-02 DIAGNOSIS — D649 Anemia, unspecified: Secondary | ICD-10-CM

## 2011-09-02 DIAGNOSIS — C9110 Chronic lymphocytic leukemia of B-cell type not having achieved remission: Secondary | ICD-10-CM

## 2011-09-02 DIAGNOSIS — D469 Myelodysplastic syndrome, unspecified: Secondary | ICD-10-CM

## 2011-09-02 DIAGNOSIS — R05 Cough: Secondary | ICD-10-CM

## 2011-09-02 DIAGNOSIS — D709 Neutropenia, unspecified: Secondary | ICD-10-CM

## 2011-09-02 DIAGNOSIS — D696 Thrombocytopenia, unspecified: Secondary | ICD-10-CM

## 2011-09-02 LAB — CBC WITH DIFFERENTIAL/PLATELET
BASO%: 1 % (ref 0.0–2.0)
HCT: 18.3 % — ABNORMAL LOW (ref 34.8–46.6)
MCHC: 33.7 g/dL (ref 31.5–36.0)
MONO#: 0 10*3/uL — ABNORMAL LOW (ref 0.1–0.9)
NEUT#: 0.6 10*3/uL — ABNORMAL LOW (ref 1.5–6.5)
NEUT%: 62.5 % (ref 38.4–76.8)
RBC: 2.08 10*6/uL — ABNORMAL LOW (ref 3.70–5.45)
WBC: 1 10*3/uL — ABNORMAL LOW (ref 3.9–10.3)
lymph#: 0.3 10*3/uL — ABNORMAL LOW (ref 0.9–3.3)

## 2011-09-02 LAB — COMPREHENSIVE METABOLIC PANEL
ALT: 18 U/L (ref 0–35)
CO2: 28 mEq/L (ref 19–32)
Calcium: 8.7 mg/dL (ref 8.4–10.5)
Chloride: 105 mEq/L (ref 96–112)
Sodium: 141 mEq/L (ref 135–145)
Total Protein: 5.4 g/dL — ABNORMAL LOW (ref 6.0–8.3)

## 2011-09-02 LAB — HOLD TUBE, BLOOD BANK

## 2011-09-02 MED ORDER — EPOETIN ALFA 40000 UNIT/ML IJ SOLN
60000.0000 [IU] | Freq: Once | INTRAMUSCULAR | Status: AC
  Administered 2011-09-02: 60000 [IU] via SUBCUTANEOUS
  Filled 2011-09-02: qty 1

## 2011-09-02 MED ORDER — HYDROCODONE-HOMATROPINE 5-1.5 MG/5ML PO SYRP: 5.0000 mL | ORAL_SOLUTION | Freq: Four times a day (QID) | ORAL | Status: DC | PRN

## 2011-09-02 NOTE — Telephone Encounter (Signed)
Per staff message and POF I have scheduled apapts. JMW  

## 2011-09-02 NOTE — Telephone Encounter (Signed)
Pt sent back to lb and given appt schedule for August/September.

## 2011-09-02 NOTE — Progress Notes (Signed)
   [facility]    OFFICE PROGRESS NOTE   INTERVAL HISTORY:   She returns as scheduled. The cough persists, but has partially improved. No dyspnea. She had a low-grade fever a few days ago.  Objective:  Vital signs in last 24 hours:  Blood pressure 163/80, pulse 102, temperature 98.4 F (36.9 C), temperature source Oral, resp. rate 18, height 5\' 5"  (1.651 m), weight 146 lb 11.2 oz (66.543 kg).    HEENT: No thrush or ulcer Resp: Lungs clear bilaterally Cardio: Regular rate and rhythm GI: No hepatosplenomegaly Vascular: No leg edema Skin: Small resolving ecchymoses at the sites of Procrit injection at the upper arm  Lab Results:  Lab Results  Component Value Date   WBC 1.0* 09/02/2011   HGB 6.2* 09/02/2011   HCT 18.3* 09/02/2011   MCV 88.1 09/02/2011   PLT 45* 09/02/2011   ANC 0.6   Medications: I have reviewed the patient's current medications.  Assessment/Plan:  1. Chronic lymphocytic leukemia, status post two cycles of fludarabine/rituximab with cycle #2 beginning on 05/08/2009. 2. Pancytopenia, status post a follow-up bone marrow biopsy, 09/01/2009, with involvement by CLL identified.  3. History of a prolonged bleeding time. A diagnostic evaluation including a von Willebrand panel and platelet function studies were negative. 4. Left breast ductal carcinoma in situ, status post a needle localized lumpectomy, 07/19/2008, followed by left breast radiation. She continues tamoxifen. 5. Chills during the Rituxan infusion, 04/05/2009. She was premedicated with Decadron at home prior to cycle #2. She tolerated cycle #2 without significant acute toxicity aside from upper chest and facial erythema. 6. Marked neutropenia on labs, 11/22 and 12/19/2009, likely a delayed nadir related to rituximab. Resolved. 7. Macrocytic anemia-progressive. A laboratory evaluation on 01/14/2011 did not suggest hemolysis. A DAT was negative and the reticulocyte count was low on  01/28/2011. Bone marrow aspiration/biopsy 02/07/2011 showed a hypercellular marrow for age with dyspoietic changes primarily involving the erythroid and megakaryocytic cell lines. There was no morphologic or immunophenotypic evidence of a B-cell lymphoproliferative process. Cytogenetics returned normal. She began a trial of weekly Procrit on 02/25/2011. She continues intermittent red cell transfusion support. Cycle 1 of 5 azacytidine started on 06/18/11. Cycle 2 of 5-azacytidine at a reduced dose beginning 08/05/2011. She received Neulasta support with cycle 2. 8. Severe neutropenia/progressive thrombocytopenia following cycle one 5-azacytidine.        9.   Cough and sore throat with abnormal lung exam on 08/19/2011. Negative chest x-ray. She completed a course of azithromycin.  Disposition:  She again has neutropenia/thrombocytopenia. Cycle 3 of 5-azacytidine will be delayed until 09/09/2011. She will receive a Red cell transfusion this week. Ms. [place] continues weekly Procrit.  The cough is most likely related to a resolving viral upper respiratory infection. She knows to contact us for a fever or shortness of breath.   Thornton Papas, MD  09/02/2011  6:21 PM

## 2011-09-03 ENCOUNTER — Other Ambulatory Visit: Payer: Self-pay | Admitting: *Deleted

## 2011-09-03 ENCOUNTER — Ambulatory Visit: Payer: Medicare Other

## 2011-09-03 DIAGNOSIS — D649 Anemia, unspecified: Secondary | ICD-10-CM

## 2011-09-04 ENCOUNTER — Ambulatory Visit (HOSPITAL_BASED_OUTPATIENT_CLINIC_OR_DEPARTMENT_OTHER): Payer: Medicare Other

## 2011-09-04 ENCOUNTER — Ambulatory Visit: Payer: Medicare Other

## 2011-09-04 VITALS — BP 119/64 | HR 83 | Temp 98.4°F | Resp 18

## 2011-09-04 DIAGNOSIS — D649 Anemia, unspecified: Secondary | ICD-10-CM

## 2011-09-04 DIAGNOSIS — D469 Myelodysplastic syndrome, unspecified: Secondary | ICD-10-CM

## 2011-09-04 MED ORDER — SODIUM CHLORIDE 0.9 % IV SOLN
250.0000 mL | Freq: Once | INTRAVENOUS | Status: AC
  Administered 2011-09-04: 250 mL via INTRAVENOUS

## 2011-09-04 NOTE — Patient Instructions (Addendum)
Blood Transfusion Information  WHAT IS A BLOOD TRANSFUSION?  A transfusion is the replacement of blood or some of its parts. Blood is made up of multiple cells which provide different functions.   Red blood cells carry oxygen and are used for blood loss replacement.   White blood cells fight against infection.   Platelets control bleeding.   Plasma helps clot blood.   Other blood products are available for specialized needs, such as hemophilia or other clotting disorders.  BEFORE THE TRANSFUSION   Who gives blood for transfusions?    You may be able to donate blood to be used at a later date on yourself (autologous donation).   Relatives can be asked to donate blood. This is generally not any safer than if you have received blood from a stranger. The same precautions are taken to ensure safety when a relative's blood is donated.   Healthy volunteers who are fully evaluated to make sure their blood is safe. This is blood bank blood.  Transfusion therapy is the safest it has ever been in the practice of medicine. Before blood is taken from a donor, a complete history is taken to make sure that person has no history of diseases nor engages in risky social behavior (examples are intravenous drug use or sexual activity with multiple partners). The donor's travel history is screened to minimize risk of transmitting infections, such as malaria. The donated blood is tested for signs of infectious diseases, such as HIV and hepatitis. The blood is then tested to be sure it is compatible with you in order to minimize the chance of a transfusion reaction. If you or a relative donates blood, this is often done in anticipation of surgery and is not appropriate for emergency situations. It takes many days to process the donated blood.  RISKS AND COMPLICATIONS  Although transfusion therapy is very safe and saves many lives, the main dangers of transfusion include:    Getting an infectious disease.   Developing a  transfusion reaction. This is an allergic reaction to something in the blood you were given. Every precaution is taken to prevent this.  The decision to have a blood transfusion has been considered carefully by your caregiver before blood is given. Blood is not given unless the benefits outweigh the risks.  AFTER THE TRANSFUSION   Right after receiving a blood transfusion, you will usually feel much better and more energetic. This is especially true if your red blood cells have gotten low (anemic). The transfusion raises the level of the red blood cells which carry oxygen, and this usually causes an energy increase.   The nurse administering the transfusion will monitor you carefully for complications.  HOME CARE INSTRUCTIONS   No special instructions are needed after a transfusion. You may find your energy is better. Speak with your caregiver about any limitations on activity for underlying diseases you may have.  SEEK MEDICAL CARE IF:    Your condition is not improving after your transfusion.   You develop redness or irritation at the intravenous (IV) site.  SEEK IMMEDIATE MEDICAL CARE IF:   Any of the following symptoms occur over the next 12 hours:   Shaking chills.   You have a temperature by mouth above 102 F (38.9 C), not controlled by medicine.   Chest, back, or muscle pain.   People around you feel you are not acting correctly or are confused.   Shortness of breath or difficulty breathing.   Dizziness and fainting.     You get a rash or develop hives.   You have a decrease in urine output.   Your urine turns a dark color or changes to pink, red, or brown.  Any of the following symptoms occur over the next 10 days:   You have a temperature by mouth above 102 F (38.9 C), not controlled by medicine.   Shortness of breath.   Weakness after normal activity.   The white part of the eye turns yellow (jaundice).   You have a decrease in the amount of urine or are urinating less often.   Your  urine turns a dark color or changes to pink, red, or brown.  Document Released: 01/05/2000 Document Revised: 12/27/2010 Document Reviewed: 08/24/2007  ExitCare Patient Information 2012 [employer].

## 2011-09-05 ENCOUNTER — Ambulatory Visit: Payer: Medicare Other

## 2011-09-05 LAB — TYPE AND SCREEN: Unit division: 0

## 2011-09-06 ENCOUNTER — Ambulatory Visit: Payer: Medicare Other

## 2011-09-08 ENCOUNTER — Other Ambulatory Visit: Payer: Self-pay | Admitting: Oncology

## 2011-09-09 ENCOUNTER — Ambulatory Visit: Payer: Medicare Other

## 2011-09-09 ENCOUNTER — Ambulatory Visit (HOSPITAL_BASED_OUTPATIENT_CLINIC_OR_DEPARTMENT_OTHER): Payer: Medicare Other

## 2011-09-09 ENCOUNTER — Other Ambulatory Visit: Payer: Self-pay | Admitting: Oncology

## 2011-09-09 ENCOUNTER — Other Ambulatory Visit (HOSPITAL_BASED_OUTPATIENT_CLINIC_OR_DEPARTMENT_OTHER): Payer: Medicare Other | Admitting: Lab

## 2011-09-09 VITALS — BP 155/72 | HR 90 | Temp 98.7°F | Resp 18

## 2011-09-09 DIAGNOSIS — D649 Anemia, unspecified: Secondary | ICD-10-CM

## 2011-09-09 DIAGNOSIS — D469 Myelodysplastic syndrome, unspecified: Secondary | ICD-10-CM

## 2011-09-09 LAB — CBC WITH DIFFERENTIAL/PLATELET
Basophils Absolute: 0 10*3/uL (ref 0.0–0.1)
Eosinophils Absolute: 0 10*3/uL (ref 0.0–0.5)
HGB: 8.3 g/dL — ABNORMAL LOW (ref 11.6–15.9)
MCV: 85 fL (ref 79.5–101.0)
MONO%: 8.3 % (ref 0.0–14.0)
NEUT#: 1 10*3/uL — ABNORMAL LOW (ref 1.5–6.5)
Platelets: 30 10*3/uL — ABNORMAL LOW (ref 145–400)
RDW: 12 % (ref 11.2–14.5)

## 2011-09-09 MED ORDER — ONDANSETRON HCL 8 MG PO TABS
8.0000 mg | ORAL_TABLET | Freq: Once | ORAL | Status: AC
  Administered 2011-09-09: 8 mg via ORAL

## 2011-09-09 MED ORDER — EPOETIN ALFA 20000 UNIT/ML IJ SOLN
20000.0000 [IU] | INTRAMUSCULAR | Status: DC
  Administered 2011-09-09: 20000 [IU] via INTRAVENOUS
  Filled 2011-09-09: qty 1

## 2011-09-09 MED ORDER — EPOETIN ALFA 40000 UNIT/ML IJ SOLN
Freq: Once | INTRAMUSCULAR | Status: DC
  Filled 2011-09-09: qty 1

## 2011-09-09 MED ORDER — AZACITIDINE CHEMO SQ INJECTION
50.0000 mg/m2 | Freq: Once | INTRAMUSCULAR | Status: AC
  Administered 2011-09-09: 87.5 mg via SUBCUTANEOUS
  Filled 2011-09-09: qty 17.5

## 2011-09-09 NOTE — Progress Notes (Signed)
Per Dr. Truett Perna- Ok to treat despite counts.

## 2011-09-09 NOTE — Patient Instructions (Addendum)
[facility] Discharge Instructions for Patients Receiving Chemotherapy  Today you received the following chemotherapy agents: Vidaza (chemo) and Procrit (red blood cells). To help prevent nausea and vomiting after your treatment, we encourage you to take your nausea medication.   BELOW ARE SYMPTOMS THAT SHOULD BE REPORTED IMMEDIATELY:  *FEVER GREATER THAN 100.5 F  *CHILLS WITH OR WITHOUT FEVER  NAUSEA AND VOMITING THAT IS NOT CONTROLLED WITH YOUR NAUSEA MEDICATION  *UNUSUAL SHORTNESS OF BREATH  *UNUSUAL BRUISING OR BLEEDING  TENDERNESS IN MOUTH AND THROAT WITH OR WITHOUT PRESENCE OF ULCERS  *URINARY PROBLEMS  *BOWEL PROBLEMS  UNUSUAL RASH Items with * indicate a potential emergency and should be followed up as soon as possible.  Feel free to call the clinic you have any questions or concerns. The clinic phone number is [phone].

## 2011-09-10 ENCOUNTER — Ambulatory Visit (HOSPITAL_BASED_OUTPATIENT_CLINIC_OR_DEPARTMENT_OTHER): Payer: Medicare Other

## 2011-09-10 VITALS — BP 126/71 | HR 79 | Temp 97.5°F | Resp 18

## 2011-09-10 DIAGNOSIS — D469 Myelodysplastic syndrome, unspecified: Secondary | ICD-10-CM

## 2011-09-10 DIAGNOSIS — Z5111 Encounter for antineoplastic chemotherapy: Secondary | ICD-10-CM

## 2011-09-10 DIAGNOSIS — C9110 Chronic lymphocytic leukemia of B-cell type not having achieved remission: Secondary | ICD-10-CM

## 2011-09-10 MED ORDER — AZACITIDINE CHEMO SQ INJECTION
50.0000 mg/m2 | Freq: Once | INTRAMUSCULAR | Status: AC
  Administered 2011-09-10: 87.5 mg via SUBCUTANEOUS
  Filled 2011-09-10: qty 17.5

## 2011-09-10 MED ORDER — ONDANSETRON HCL 8 MG PO TABS
8.0000 mg | ORAL_TABLET | Freq: Once | ORAL | Status: AC
  Administered 2011-09-10: 8 mg via ORAL

## 2011-09-10 NOTE — Patient Instructions (Addendum)
[facility] Discharge Instructions for Patients Receiving Chemotherapy  Today you received the following chemotherapy agents vidaza  To help prevent nausea and vomiting after your treatment, we encourage you to take your nausea medication   and take it as often as prescribed.   If you develop nausea and vomiting that is not controlled by your nausea medication, call the clinic. If it is after clinic hours your family physician or the after hours number for the clinic or go to the Emergency Department.   BELOW ARE SYMPTOMS THAT SHOULD BE REPORTED IMMEDIATELY:  *FEVER GREATER THAN 100.5 F  *CHILLS WITH OR WITHOUT FEVER  NAUSEA AND VOMITING THAT IS NOT CONTROLLED WITH YOUR NAUSEA MEDICATION  *UNUSUAL SHORTNESS OF BREATH  *UNUSUAL BRUISING OR BLEEDING  TENDERNESS IN MOUTH AND THROAT WITH OR WITHOUT PRESENCE OF ULCERS  *URINARY PROBLEMS  *BOWEL PROBLEMS  UNUSUAL RASH Items with * indicate a potential emergency and should be followed up as soon as possible.  One of the nurses will contact you 24 hours after your treatment. Please let the nurse know about any problems that you may have experienced. Feel free to call the clinic you have any questions or concerns. The clinic phone number is [phone].   I have been informed and understand all the instructions given to me. I know to contact the clinic, my physician, or go to the Emergency Department if any problems should occur. I do not have any questions at this time, but understand that I may call the clinic during office hours   should I have any questions or need assistance in obtaining follow up care.    __________________________________________  _____________  __________ Signature of Patient or Authorized Representative            Date                   Time    __________________________________________ Nurse's Signature    

## 2011-09-11 ENCOUNTER — Ambulatory Visit (HOSPITAL_BASED_OUTPATIENT_CLINIC_OR_DEPARTMENT_OTHER): Payer: Medicare Other

## 2011-09-11 VITALS — BP 158/58 | HR 82 | Temp 98.4°F | Resp 20

## 2011-09-11 DIAGNOSIS — D469 Myelodysplastic syndrome, unspecified: Secondary | ICD-10-CM

## 2011-09-11 MED ORDER — ONDANSETRON HCL 8 MG PO TABS
8.0000 mg | ORAL_TABLET | Freq: Once | ORAL | Status: AC
  Administered 2011-09-11: 8 mg via ORAL

## 2011-09-11 MED ORDER — AZACITIDINE CHEMO SQ INJECTION
50.0000 mg/m2 | Freq: Once | INTRAMUSCULAR | Status: AC
  Administered 2011-09-11: 87.5 mg via SUBCUTANEOUS
  Filled 2011-09-11: qty 17.5

## 2011-09-12 ENCOUNTER — Ambulatory Visit (HOSPITAL_BASED_OUTPATIENT_CLINIC_OR_DEPARTMENT_OTHER): Payer: Medicare Other

## 2011-09-12 VITALS — BP 131/71 | HR 78 | Temp 98.1°F

## 2011-09-12 DIAGNOSIS — D469 Myelodysplastic syndrome, unspecified: Secondary | ICD-10-CM

## 2011-09-12 DIAGNOSIS — Z5111 Encounter for antineoplastic chemotherapy: Secondary | ICD-10-CM

## 2011-09-12 MED ORDER — ONDANSETRON HCL 8 MG PO TABS
8.0000 mg | ORAL_TABLET | Freq: Once | ORAL | Status: AC
  Administered 2011-09-12: 8 mg via ORAL

## 2011-09-12 MED ORDER — AZACITIDINE CHEMO SQ INJECTION
50.0000 mg/m2 | Freq: Once | INTRAMUSCULAR | Status: AC
  Administered 2011-09-12: 87.5 mg via SUBCUTANEOUS
  Filled 2011-09-12: qty 17.5

## 2011-09-12 NOTE — Progress Notes (Signed)
Per note 08/19 - MD ok to treat with lab values of 08/19.

## 2011-09-12 NOTE — Patient Instructions (Signed)
[facility] Discharge Instructions for Patients Receiving Chemotherapy  Today you received the following chemotherapy agents: Vidaza  To help prevent nausea and vomiting after your treatment, we encourage you to take your nausea medication.  Take it as often as prescribed.     If you develop nausea and vomiting that is not controlled by your nausea medication, call the clinic. If it is after clinic hours your family physician or the after hours number for the clinic or go to the Emergency Department.   BELOW ARE SYMPTOMS THAT SHOULD BE REPORTED IMMEDIATELY:  *FEVER GREATER THAN 100.5 F  *CHILLS WITH OR WITHOUT FEVER  NAUSEA AND VOMITING THAT IS NOT CONTROLLED WITH YOUR NAUSEA MEDICATION  *UNUSUAL SHORTNESS OF BREATH  *UNUSUAL BRUISING OR BLEEDING  TENDERNESS IN MOUTH AND THROAT WITH OR WITHOUT PRESENCE OF ULCERS  *URINARY PROBLEMS  *BOWEL PROBLEMS  UNUSUAL RASH Items with * indicate a potential emergency and should be followed up as soon as possible.  Feel free to call the clinic you have any questions or concerns. The clinic phone number is [phone].   I have been informed and understand all the instructions given to me. I know to contact the clinic, my physician, or go to the Emergency Department if any problems should occur. I do not have any questions at this time, but understand that I may call the clinic during office hours   should I have any questions or need assistance in obtaining follow up care.    __________________________________________  _____________  __________ Signature of Patient or Authorized Representative            Date                   Time    __________________________________________ Nurse's Signature    

## 2011-09-13 ENCOUNTER — Ambulatory Visit (HOSPITAL_BASED_OUTPATIENT_CLINIC_OR_DEPARTMENT_OTHER): Payer: Medicare Other

## 2011-09-13 VITALS — BP 128/70 | HR 81 | Temp 98.1°F | Resp 20

## 2011-09-13 DIAGNOSIS — D469 Myelodysplastic syndrome, unspecified: Secondary | ICD-10-CM

## 2011-09-13 MED ORDER — AZACITIDINE CHEMO SQ INJECTION
50.0000 mg/m2 | Freq: Once | INTRAMUSCULAR | Status: AC
  Administered 2011-09-13: 87.5 mg via SUBCUTANEOUS
  Filled 2011-09-13: qty 17.5

## 2011-09-13 MED ORDER — ONDANSETRON HCL 8 MG PO TABS
8.0000 mg | ORAL_TABLET | Freq: Once | ORAL | Status: AC
  Administered 2011-09-13: 8 mg via ORAL

## 2011-09-13 NOTE — Patient Instructions (Addendum)
[facility] Discharge Instructions for Patients Receiving Chemotherapy  Today you received the following chemotherapy agents Vidaza  To help prevent nausea and vomiting after your treatment, we encourage you to take your nausea medication Begin taking it at 7 pm and take it as often as prescribed for the next 24 to 72 hours.   If you develop nausea and vomiting that is not controlled by your nausea medication, call the clinic. If it is after clinic hours your family physician or the after hours number for the clinic or go to the Emergency Department.   BELOW ARE SYMPTOMS THAT SHOULD BE REPORTED IMMEDIATELY:  *FEVER GREATER THAN 100.5 F  *CHILLS WITH OR WITHOUT FEVER  NAUSEA AND VOMITING THAT IS NOT CONTROLLED WITH YOUR NAUSEA MEDICATION  *UNUSUAL SHORTNESS OF BREATH  *UNUSUAL BRUISING OR BLEEDING  TENDERNESS IN MOUTH AND THROAT WITH OR WITHOUT PRESENCE OF ULCERS  *URINARY PROBLEMS  *BOWEL PROBLEMS  UNUSUAL RASH Items with * indicate a potential emergency and should be followed up as soon as possible.  One of the nurses will contact you 24 hours after your treatment. Please let the nurse know about any problems that you may have experienced. Feel free to call the clinic you have any questions or concerns. The clinic phone number is [phone].   I have been informed and understand all the instructions given to me. I know to contact the clinic, my physician, or go to the Emergency Department if any problems should occur. I do not have any questions at this time, but understand that I may call the clinic during office hours   should I have any questions or need assistance in obtaining follow up care.    __________________________________________  _____________  __________ Signature of Patient or Authorized Representative            Date                   Time    __________________________________________ Nurse's Signature

## 2011-09-14 ENCOUNTER — Ambulatory Visit (HOSPITAL_BASED_OUTPATIENT_CLINIC_OR_DEPARTMENT_OTHER): Payer: Medicare Other

## 2011-09-14 VITALS — BP 149/72 | HR 90 | Temp 98.9°F

## 2011-09-14 DIAGNOSIS — Z5189 Encounter for other specified aftercare: Secondary | ICD-10-CM

## 2011-09-14 DIAGNOSIS — D469 Myelodysplastic syndrome, unspecified: Secondary | ICD-10-CM

## 2011-09-14 MED ORDER — PEGFILGRASTIM INJECTION 6 MG/0.6ML
6.0000 mg | Freq: Once | SUBCUTANEOUS | Status: AC
Start: 2011-09-14 — End: 2011-09-14
  Administered 2011-09-14: 6 mg via SUBCUTANEOUS
  Filled 2011-09-14: qty 0.6

## 2011-09-16 ENCOUNTER — Other Ambulatory Visit: Payer: Self-pay | Admitting: Certified Registered Nurse Anesthetist

## 2011-09-16 ENCOUNTER — Other Ambulatory Visit: Payer: Self-pay | Admitting: *Deleted

## 2011-09-16 ENCOUNTER — Telehealth: Payer: Self-pay | Admitting: *Deleted

## 2011-09-16 ENCOUNTER — Ambulatory Visit (HOSPITAL_BASED_OUTPATIENT_CLINIC_OR_DEPARTMENT_OTHER): Payer: Medicare Other

## 2011-09-16 ENCOUNTER — Other Ambulatory Visit (HOSPITAL_BASED_OUTPATIENT_CLINIC_OR_DEPARTMENT_OTHER): Payer: Medicare Other | Admitting: Lab

## 2011-09-16 DIAGNOSIS — D649 Anemia, unspecified: Secondary | ICD-10-CM

## 2011-09-16 DIAGNOSIS — D469 Myelodysplastic syndrome, unspecified: Secondary | ICD-10-CM

## 2011-09-16 LAB — CBC WITH DIFFERENTIAL/PLATELET
BASO%: 0.4 % (ref 0.0–2.0)
EOS%: 0.7 % (ref 0.0–7.0)
MCH: 29.9 pg (ref 25.1–34.0)
MCHC: 34 g/dL (ref 31.5–36.0)
MONO#: 0.2 10*3/uL (ref 0.1–0.9)
NEUT%: 86.4 % — ABNORMAL HIGH (ref 38.4–76.8)
RBC: 2.38 10*6/uL — ABNORMAL LOW (ref 3.70–5.45)
RDW: 12.1 % (ref 11.2–14.5)
WBC: 4.7 10*3/uL (ref 3.9–10.3)
lymph#: 0.4 10*3/uL — ABNORMAL LOW (ref 0.9–3.3)

## 2011-09-16 LAB — HOLD TUBE, BLOOD BANK

## 2011-09-16 MED ORDER — EPOETIN ALFA 20000 UNIT/ML IJ SOLN
60000.0000 [IU] | INTRAMUSCULAR | Status: DC
  Administered 2011-09-16: 60000 [IU] via INTRAVENOUS
  Filled 2011-09-16: qty 3

## 2011-09-16 MED ORDER — PEGFILGRASTIM INJECTION 6 MG/0.6ML
6.0000 mg | Freq: Once | SUBCUTANEOUS | Status: DC
  Filled 2011-09-16: qty 0.6

## 2011-09-16 NOTE — Telephone Encounter (Signed)
Called pt, she reports she is feeling fatigued. Thinks she may need a transfusion later this week. Informed her I will arrange another lab appt and possible transfusion later this week. Pt will need crossmatch repeated. She voiced understanding.

## 2011-09-18 ENCOUNTER — Telehealth: Payer: Self-pay | Admitting: *Deleted

## 2011-09-18 ENCOUNTER — Other Ambulatory Visit: Payer: Self-pay | Admitting: *Deleted

## 2011-09-18 DIAGNOSIS — D649 Anemia, unspecified: Secondary | ICD-10-CM

## 2011-09-18 NOTE — Telephone Encounter (Signed)
Received call from pt stating "feeling really weak, think I need a transfusion my Hgb was 7.1 on Monday"  Per Dr. Truett Perna, pt will need to be set up for transfusion this week.  Called and spoke with pt notified her that  blood transfusion is scheduled for  Friday 09/20/11 at 1030 and to come in for lab at 0930.  Pt voiced back that she will come in Friday at 0930 for lab/transfusion.

## 2011-09-20 ENCOUNTER — Other Ambulatory Visit (HOSPITAL_BASED_OUTPATIENT_CLINIC_OR_DEPARTMENT_OTHER): Payer: Medicare Other | Admitting: Lab

## 2011-09-20 ENCOUNTER — Ambulatory Visit: Payer: Medicare Other

## 2011-09-20 VITALS — BP 107/68 | HR 82 | Temp 98.1°F | Resp 20

## 2011-09-20 DIAGNOSIS — D469 Myelodysplastic syndrome, unspecified: Secondary | ICD-10-CM

## 2011-09-20 DIAGNOSIS — D649 Anemia, unspecified: Secondary | ICD-10-CM

## 2011-09-20 LAB — CBC WITH DIFFERENTIAL/PLATELET
BASO%: 0.2 % (ref 0.0–2.0)
EOS%: 0.7 % (ref 0.0–7.0)
HCT: 18.1 % — ABNORMAL LOW (ref 34.8–46.6)
LYMPH%: 9.3 % — ABNORMAL LOW (ref 14.0–49.7)
MCH: 28.7 pg (ref 25.1–34.0)
MCHC: 33.1 g/dL (ref 31.5–36.0)
NEUT%: 87.2 % — ABNORMAL HIGH (ref 38.4–76.8)
Platelets: 26 10*3/uL — ABNORMAL LOW (ref 145–400)

## 2011-09-20 MED ORDER — SODIUM CHLORIDE 0.9 % IV SOLN
250.0000 mL | Freq: Once | INTRAVENOUS | Status: AC
  Administered 2011-09-20: 250 mL via INTRAVENOUS

## 2011-09-20 MED ORDER — SODIUM CHLORIDE 0.9 % IJ SOLN
3.0000 mL | INTRAMUSCULAR | Status: DC | PRN
  Filled 2011-09-20: qty 10

## 2011-09-20 MED ORDER — HEPARIN SOD (PORK) LOCK FLUSH 100 UNIT/ML IV SOLN
250.0000 [IU] | INTRAVENOUS | Status: DC | PRN
  Filled 2011-09-20: qty 5

## 2011-09-20 NOTE — Patient Instructions (Signed)
Blood Transfusion Information  WHAT IS A BLOOD TRANSFUSION?  A transfusion is the replacement of blood or some of its parts. Blood is made up of multiple cells which provide different functions.   Red blood cells carry oxygen and are used for blood loss replacement.   White blood cells fight against infection.   Platelets control bleeding.   Plasma helps clot blood.   Other blood products are available for specialized needs, such as hemophilia or other clotting disorders.  BEFORE THE TRANSFUSION   Who gives blood for transfusions?    You may be able to donate blood to be used at a later date on yourself (autologous donation).   Relatives can be asked to donate blood. This is generally not any safer than if you have received blood from a stranger. The same precautions are taken to ensure safety when a relative's blood is donated.   Healthy volunteers who are fully evaluated to make sure their blood is safe. This is blood bank blood.  Transfusion therapy is the safest it has ever been in the practice of medicine. Before blood is taken from a donor, a complete history is taken to make sure that person has no history of diseases nor engages in risky social behavior (examples are intravenous drug use or sexual activity with multiple partners). The donor's travel history is screened to minimize risk of transmitting infections, such as malaria. The donated blood is tested for signs of infectious diseases, such as HIV and hepatitis. The blood is then tested to be sure it is compatible with you in order to minimize the chance of a transfusion reaction. If you or a relative donates blood, this is often done in anticipation of surgery and is not appropriate for emergency situations. It takes many days to process the donated blood.  RISKS AND COMPLICATIONS  Although transfusion therapy is very safe and saves many lives, the main dangers of transfusion include:    Getting an infectious disease.   Developing a  transfusion reaction. This is an allergic reaction to something in the blood you were given. Every precaution is taken to prevent this.  The decision to have a blood transfusion has been considered carefully by your caregiver before blood is given. Blood is not given unless the benefits outweigh the risks.  AFTER THE TRANSFUSION   Right after receiving a blood transfusion, you will usually feel much better and more energetic. This is especially true if your red blood cells have gotten low (anemic). The transfusion raises the level of the red blood cells which carry oxygen, and this usually causes an energy increase.   The nurse administering the transfusion will monitor you carefully for complications.  HOME CARE INSTRUCTIONS   No special instructions are needed after a transfusion. You may find your energy is better. Speak with your caregiver about any limitations on activity for underlying diseases you may have.  SEEK MEDICAL CARE IF:    Your condition is not improving after your transfusion.   You develop redness or irritation at the intravenous (IV) site.  SEEK IMMEDIATE MEDICAL CARE IF:   Any of the following symptoms occur over the next 12 hours:   Shaking chills.   You have a temperature by mouth above 102 F (38.9 C), not controlled by medicine.   Chest, back, or muscle pain.   People around you feel you are not acting correctly or are confused.   Shortness of breath or difficulty breathing.   Dizziness and fainting.     You get a rash or develop hives.   You have a decrease in urine output.   Your urine turns a dark color or changes to pink, red, or brown.  Any of the following symptoms occur over the next 10 days:   You have a temperature by mouth above 102 F (38.9 C), not controlled by medicine.   Shortness of breath.   Weakness after normal activity.   The white part of the eye turns yellow (jaundice).   You have a decrease in the amount of urine or are urinating less often.   Your  urine turns a dark color or changes to pink, red, or brown.  Document Released: 01/05/2000 Document Revised: 12/27/2010 Document Reviewed: 08/24/2007  [employer] Patient Information 2012 [employer].

## 2011-09-21 ENCOUNTER — Telehealth: Payer: Self-pay | Admitting: Oncology

## 2011-09-21 LAB — TYPE AND SCREEN
ABO/RH(D): O POS
Antibody Screen: NEGATIVE
Unit division: 0

## 2011-09-21 NOTE — Telephone Encounter (Signed)
Talked to patient and she is aware of appt on 09/24/11 lab and ML

## 2011-09-24 ENCOUNTER — Other Ambulatory Visit (HOSPITAL_BASED_OUTPATIENT_CLINIC_OR_DEPARTMENT_OTHER): Payer: Medicare Other | Admitting: Lab

## 2011-09-24 ENCOUNTER — Telehealth: Payer: Self-pay | Admitting: Oncology

## 2011-09-24 ENCOUNTER — Telehealth: Payer: Self-pay | Admitting: *Deleted

## 2011-09-24 ENCOUNTER — Ambulatory Visit (HOSPITAL_BASED_OUTPATIENT_CLINIC_OR_DEPARTMENT_OTHER): Payer: Medicare Other | Admitting: Nurse Practitioner

## 2011-09-24 VITALS — BP 160/72 | HR 86 | Temp 97.7°F | Resp 20 | Ht 65.0 in | Wt 147.5 lb

## 2011-09-24 DIAGNOSIS — C50419 Malignant neoplasm of upper-outer quadrant of unspecified female breast: Secondary | ICD-10-CM

## 2011-09-24 DIAGNOSIS — D649 Anemia, unspecified: Secondary | ICD-10-CM

## 2011-09-24 DIAGNOSIS — D469 Myelodysplastic syndrome, unspecified: Secondary | ICD-10-CM

## 2011-09-24 DIAGNOSIS — D539 Nutritional anemia, unspecified: Secondary | ICD-10-CM

## 2011-09-24 DIAGNOSIS — R942 Abnormal results of pulmonary function studies: Secondary | ICD-10-CM

## 2011-09-24 DIAGNOSIS — C9110 Chronic lymphocytic leukemia of B-cell type not having achieved remission: Secondary | ICD-10-CM

## 2011-09-24 LAB — CBC WITH DIFFERENTIAL/PLATELET
Basophils Absolute: 0 10*3/uL (ref 0.0–0.1)
EOS%: 1.2 % (ref 0.0–7.0)
HCT: 27.4 % — ABNORMAL LOW (ref 34.8–46.6)
HGB: 9.2 g/dL — ABNORMAL LOW (ref 11.6–15.9)
LYMPH%: 15 % (ref 14.0–49.7)
MCH: 29.8 pg (ref 25.1–34.0)
MCHC: 33.7 g/dL (ref 31.5–36.0)
MCV: 88.7 fL (ref 79.5–101.0)
MONO%: 0.9 % (ref 0.0–14.0)
NEUT%: 82.2 % — ABNORMAL HIGH (ref 38.4–76.8)
Platelets: 30 10*3/uL — ABNORMAL LOW (ref 145–400)
lymph#: 0.5 10*3/uL — ABNORMAL LOW (ref 0.9–3.3)

## 2011-09-24 MED ORDER — EPOETIN ALFA 20000 UNIT/ML IJ SOLN
60000.0000 [IU] | Freq: Once | INTRAMUSCULAR | Status: AC
  Administered 2011-09-24: 60000 [IU] via SUBCUTANEOUS
  Filled 2011-09-24: qty 3

## 2011-09-24 NOTE — Telephone Encounter (Signed)
Per staff message and POF I have scheduled appts.  JMW  

## 2011-09-24 NOTE — Progress Notes (Signed)
OFFICE PROGRESS NOTE  Interval history:  Anne Doyle returns as scheduled. She completed cycle 3 5-azacytidine beginning 09/09/2011. She was transfused 2 units of blood on 09/20/2011 for a hemoglobin of 6. She is feeling better since the blood transfusion. She has a productive cough. The cough improved following a recent course of azithromycin. The cough recently recurred. She denies fever. No shortness of breath. No wheezing. She denies bleeding.   Objective: Blood pressure 160/72, pulse 86, temperature 97.7 F (36.5 C), temperature source Oral, resp. rate 20, height 5\' 5"  (1.651 m), weight 147 lb 8 oz (66.906 kg).  Oropharynx is without thrush or ulceration. Lungs are clear. Regular cardiac rhythm. Abdomen is soft and nontender. No organomegaly. Extremities are without edema  Lab Results: Lab Results  Component Value Date   WBC 3.6* 09/24/2011   HGB 9.2* 09/24/2011   HCT 27.4* 09/24/2011   MCV 88.7 09/24/2011   PLT 30* 09/24/2011    Chemistry:    Chemistry      Component Value Date/Time   NA 141 09/02/2011 0928   K 4.0 09/02/2011 0928   CL 105 09/02/2011 0928   CO2 28 09/02/2011 0928   BUN 15 09/02/2011 0928   CREATININE 0.71 09/02/2011 0928      Component Value Date/Time   CALCIUM 8.7 09/02/2011 0928   ALKPHOS 73 09/02/2011 0928   AST 13 09/02/2011 0928   ALT 18 09/02/2011 0928   BILITOT 0.5 09/02/2011 0928       Studies/Results: No results found.  Medications: I have reviewed the patient's current medications.  Assessment/Plan:  1. Chronic lymphocytic leukemia, status post two cycles of fludarabine/rituximab with cycle #2 beginning on 05/08/2009. 2. Pancytopenia, status post a follow-up bone marrow biopsy, 09/01/2009, with involvement by CLL identified.  3. History of a prolonged bleeding time. A diagnostic evaluation including a von Willebrand panel and platelet function studies were negative. 4. Left breast ductal carcinoma in situ, status post a needle localized lumpectomy,  07/19/2008, followed by left breast radiation. She continues tamoxifen. 5. Chills during the Rituxan infusion, 04/05/2009. She was premedicated with Decadron at home prior to cycle #2. She tolerated cycle #2 without significant acute toxicity aside from upper chest and facial erythema. 6. Marked neutropenia on labs, 11/22 and 12/19/2009, likely a delayed nadir related to rituximab. Resolved. 7. Macrocytic anemia-progressive. A laboratory evaluation on 01/14/2011 did not suggest hemolysis. A DAT was negative and the reticulocyte count was low on 01/28/2011. Bone marrow aspiration/biopsy 02/07/2011 showed a hypercellular marrow for age with dyspoietic changes primarily involving the erythroid and megakaryocytic cell lines. There was no morphologic or immunophenotypic evidence of a B-cell lymphoproliferative process. Cytogenetics returned normal. She began a trial of weekly Procrit on 02/25/2011. She continues intermittent red cell transfusion support. Cycle 1 of 5 azacytidine started on 06/18/11. Cycle 2 of 5-azacytidine at a reduced dose beginning 08/05/2011. She received Neulasta support with cycle 2. Cycle 3 of 5-azacytidine beginning 09/09/2011. She again receive Neulasta support. 8. Severe neutropenia/progressive thrombocytopenia following cycle one 5-azacytidine.  9. Cough and sore throat with abnormal lung exam on 08/19/2011. Negative chest x-ray. She completed a course of azithromycin. The cough recently recurred.  Disposition-Anne Doyle appears stable. Plan to continue weekly Procrit injections. She will return for a followup visit and cycle 4 5-azacytidine on 10/07/2011. She will contact the office in the interim with any problems. We specifically discussed worsening of the cough, fever, shortness of breath.  Plan reviewed with Dr. Derenda Fennel, Misty Stanley ANP/GNP-BC

## 2011-09-24 NOTE — Telephone Encounter (Signed)
appts made and printed for  Pt pt aware that vidaza will be added and will get a new sch on 9/9

## 2011-09-30 ENCOUNTER — Ambulatory Visit (HOSPITAL_BASED_OUTPATIENT_CLINIC_OR_DEPARTMENT_OTHER): Payer: Medicare Other

## 2011-09-30 ENCOUNTER — Telehealth: Payer: Self-pay | Admitting: Oncology

## 2011-09-30 ENCOUNTER — Other Ambulatory Visit (HOSPITAL_BASED_OUTPATIENT_CLINIC_OR_DEPARTMENT_OTHER): Payer: Medicare Other | Admitting: Lab

## 2011-09-30 VITALS — BP 142/70 | HR 99 | Temp 97.6°F

## 2011-09-30 DIAGNOSIS — D469 Myelodysplastic syndrome, unspecified: Secondary | ICD-10-CM

## 2011-09-30 DIAGNOSIS — D649 Anemia, unspecified: Secondary | ICD-10-CM

## 2011-09-30 LAB — CBC WITH DIFFERENTIAL/PLATELET
Basophils Absolute: 0 10*3/uL (ref 0.0–0.1)
EOS%: 2.3 % (ref 0.0–7.0)
Eosinophils Absolute: 0 10*3/uL (ref 0.0–0.5)
LYMPH%: 26.2 % (ref 14.0–49.7)
MCH: 29.4 pg (ref 25.1–34.0)
MCV: 87.7 fL (ref 79.5–101.0)
MONO%: 5.4 % (ref 0.0–14.0)
NEUT#: 0.8 10*3/uL — ABNORMAL LOW (ref 1.5–6.5)
Platelets: 50 10*3/uL — ABNORMAL LOW (ref 145–400)
RBC: 2.52 10*6/uL — ABNORMAL LOW (ref 3.70–5.45)

## 2011-09-30 MED ORDER — EPOETIN ALFA 20000 UNIT/ML IJ SOLN
60000.0000 [IU] | Freq: Once | INTRAMUSCULAR | Status: AC
  Administered 2011-09-30: 60000 [IU] via SUBCUTANEOUS
  Filled 2011-09-30: qty 3

## 2011-09-30 NOTE — Telephone Encounter (Signed)
Pt came in today for new September schedule.

## 2011-10-04 ENCOUNTER — Other Ambulatory Visit: Payer: Self-pay | Admitting: Oncology

## 2011-10-07 ENCOUNTER — Ambulatory Visit (HOSPITAL_BASED_OUTPATIENT_CLINIC_OR_DEPARTMENT_OTHER): Payer: Medicare Other

## 2011-10-07 ENCOUNTER — Telehealth: Payer: Self-pay | Admitting: Oncology

## 2011-10-07 ENCOUNTER — Ambulatory Visit (HOSPITAL_BASED_OUTPATIENT_CLINIC_OR_DEPARTMENT_OTHER): Payer: Medicare Other | Admitting: Oncology

## 2011-10-07 ENCOUNTER — Other Ambulatory Visit (HOSPITAL_BASED_OUTPATIENT_CLINIC_OR_DEPARTMENT_OTHER): Payer: Medicare Other | Admitting: Lab

## 2011-10-07 ENCOUNTER — Encounter (HOSPITAL_COMMUNITY)
Admission: RE | Admit: 2011-10-07 | Discharge: 2011-10-07 | Disposition: A | Discharge status: Home / Self Care | Payer: Medicare Other | Source: Ambulatory Visit | Attending: Oncology | Admitting: Oncology

## 2011-10-07 VITALS — BP 171/79 | HR 101 | Temp 97.7°F | Resp 20 | Ht 65.0 in | Wt 150.3 lb

## 2011-10-07 VITALS — BP 146/75 | HR 84 | Temp 98.4°F | Resp 20

## 2011-10-07 DIAGNOSIS — D649 Anemia, unspecified: Secondary | ICD-10-CM

## 2011-10-07 DIAGNOSIS — D539 Nutritional anemia, unspecified: Secondary | ICD-10-CM

## 2011-10-07 DIAGNOSIS — B373 Candidiasis of vulva and vagina: Secondary | ICD-10-CM

## 2011-10-07 DIAGNOSIS — D696 Thrombocytopenia, unspecified: Secondary | ICD-10-CM

## 2011-10-07 DIAGNOSIS — C9110 Chronic lymphocytic leukemia of B-cell type not having achieved remission: Secondary | ICD-10-CM

## 2011-10-07 DIAGNOSIS — D469 Myelodysplastic syndrome, unspecified: Secondary | ICD-10-CM

## 2011-10-07 DIAGNOSIS — D709 Neutropenia, unspecified: Secondary | ICD-10-CM

## 2011-10-07 DIAGNOSIS — B3731 Acute candidiasis of vulva and vagina: Secondary | ICD-10-CM | POA: Insufficient documentation

## 2011-10-07 LAB — CBC WITH DIFFERENTIAL/PLATELET
BASO%: 0.8 % (ref 0.0–2.0)
LYMPH%: 27.5 % (ref 14.0–49.7)
MCHC: 33.8 g/dL (ref 31.5–36.0)
MCV: 87.7 fL (ref 79.5–101.0)
MONO%: 5.7 % (ref 0.0–14.0)
Platelets: 38 10*3/uL — ABNORMAL LOW (ref 145–400)
RBC: 2.02 10*6/uL — ABNORMAL LOW (ref 3.70–5.45)
WBC: 0.9 10*3/uL — CL (ref 3.9–10.3)

## 2011-10-07 LAB — COMPREHENSIVE METABOLIC PANEL (CC13)
ALT: 61 U/L — ABNORMAL HIGH (ref 0–55)
AST: 31 U/L (ref 5–34)
Alkaline Phosphatase: 68 U/L (ref 40–150)
Sodium: 139 mEq/L (ref 136–145)
Total Bilirubin: 0.8 mg/dL (ref 0.20–1.20)
Total Protein: 5.8 g/dL — ABNORMAL LOW (ref 6.4–8.3)

## 2011-10-07 MED ORDER — SODIUM CHLORIDE 0.9 % IV SOLN: 250.0000 mL | Freq: Once | INTRAVENOUS | Status: DC

## 2011-10-07 MED ORDER — FLUCONAZOLE 150 MG PO TABS: 150.0000 mg | ORAL_TABLET | Freq: Once | ORAL | Status: DC

## 2011-10-07 NOTE — Telephone Encounter (Signed)
Sent patient for type and cross, chemo r/s to 9/30 per MD,gave patient a calendar for September and October 2013

## 2011-10-07 NOTE — Telephone Encounter (Signed)
Gave referral to HIM for a consult with Dr. Lowell Guitar @ [facility]

## 2011-10-07 NOTE — Patient Instructions (Addendum)
Blood Transfusion Information  WHAT IS A BLOOD TRANSFUSION?  A transfusion is the replacement of blood or some of its parts. Blood is made up of multiple cells which provide different functions.   Red blood cells carry oxygen and are used for blood loss replacement.   White blood cells fight against infection.   Platelets control bleeding.   Plasma helps clot blood.   Other blood products are available for specialized needs, such as hemophilia or other clotting disorders.  BEFORE THE TRANSFUSION   Who gives blood for transfusions?    You may be able to donate blood to be used at a later date on yourself (autologous donation).   Relatives can be asked to donate blood. This is generally not any safer than if you have received blood from a stranger. The same precautions are taken to ensure safety when a relative's blood is donated.   Healthy volunteers who are fully evaluated to make sure their blood is safe. This is blood bank blood.  Transfusion therapy is the safest it has ever been in the practice of medicine. Before blood is taken from a donor, a complete history is taken to make sure that person has no history of diseases nor engages in risky social behavior (examples are intravenous drug use or sexual activity with multiple partners). The donor's travel history is screened to minimize risk of transmitting infections, such as malaria. The donated blood is tested for signs of infectious diseases, such as HIV and hepatitis. The blood is then tested to be sure it is compatible with you in order to minimize the chance of a transfusion reaction. If you or a relative donates blood, this is often done in anticipation of surgery and is not appropriate for emergency situations. It takes many days to process the donated blood.  RISKS AND COMPLICATIONS  Although transfusion therapy is very safe and saves many lives, the main dangers of transfusion include:    Getting an infectious disease.   Developing a  transfusion reaction. This is an allergic reaction to something in the blood you were given. Every precaution is taken to prevent this.  The decision to have a blood transfusion has been considered carefully by your caregiver before blood is given. Blood is not given unless the benefits outweigh the risks.  AFTER THE TRANSFUSION   Right after receiving a blood transfusion, you will usually feel much better and more energetic. This is especially true if your red blood cells have gotten low (anemic). The transfusion raises the level of the red blood cells which carry oxygen, and this usually causes an energy increase.   The nurse administering the transfusion will monitor you carefully for complications.  HOME CARE INSTRUCTIONS   No special instructions are needed after a transfusion. You may find your energy is better. Speak with your caregiver about any limitations on activity for underlying diseases you may have.  SEEK MEDICAL CARE IF:    Your condition is not improving after your transfusion.   You develop redness or irritation at the intravenous (IV) site.  SEEK IMMEDIATE MEDICAL CARE IF:   Any of the following symptoms occur over the next 12 hours:   Shaking chills.   You have a temperature by mouth above 102 F (38.9 C), not controlled by medicine.   Chest, back, or muscle pain.   People around you feel you are not acting correctly or are confused.   Shortness of breath or difficulty breathing.   Dizziness and fainting.     You get a rash or develop hives.   You have a decrease in urine output.   Your urine turns a dark color or changes to pink, red, or brown.  Any of the following symptoms occur over the next 10 days:   You have a temperature by mouth above 102 F (38.9 C), not controlled by medicine.   Shortness of breath.   Weakness after normal activity.   The white part of the eye turns yellow (jaundice).   You have a decrease in the amount of urine or are urinating less often.   Your  urine turns a dark color or changes to pink, red, or brown.  Document Released: 01/05/2000 Document Revised: 12/27/2010 Document Reviewed: 08/24/2007  ExitCare Patient Information 2012 [employer].

## 2011-10-07 NOTE — Patient Instructions (Signed)
[facility] Discharge Instructions  Your exam findings, labs and results were discussed with your MD today.   Please visit scheduling to obtain calendar for future appointments.  Please call the [facility] at [phone] during business hours should you have any further questions or need assistance in obtaining follow-up care. If you have a medical emergency, please dial 911.  Special Instructions:         

## 2011-10-07 NOTE — Progress Notes (Signed)
   Anne Doyle    OFFICE PROGRESS NOTE   INTERVAL HISTORY:   She returns as scheduled. The upper respiratory infection has resolved. She complains of malaise and exertional dyspnea. No fever. She has vaginal discharge and itching.  Objective:  Vital signs in last 24 hours:  Blood pressure 171/79, pulse 101, temperature 97.7 F (36.5 C), temperature source Oral, resp. rate 20, height 5\' 5"  (1.651 m), weight 150 lb 4.8 oz (68.176 kg).    HEENT: No thrush or ulcers Lymphatics: No cervical, supraclavicular, axillary, or inguinal nodes Resp: Lungs clear bilaterally Cardio: Regular rate and rhythm GI: No hepatosplenomegaly, nontender Vascular: No leg edema  Lab Results:  Lab Results  Component Value Date   WBC 0.9* 10/07/2011   HGB 6.0* 10/07/2011   HCT 17.7* 10/07/2011   MCV 87.7 10/07/2011   PLT 38* 10/07/2011   ANC 0.5   Medications: I have reviewed the patient's current medications.  Assessment/Plan: 1. Chronic lymphocytic leukemia, status post two cycles of fludarabine/rituximab with cycle #2 beginning on 05/08/2009. 2. Pancytopenia, status post a follow-up bone marrow biopsy, 09/01/2009, with involvement by CLL identified.  3. History of a prolonged bleeding time. A diagnostic evaluation including a von Willebrand panel and platelet function studies were negative. 4. Left breast ductal carcinoma in situ, status post a needle localized lumpectomy, 07/19/2008, followed by left breast radiation. She continues tamoxifen. 5. Chills during the Rituxan infusion, 04/05/2009. She was premedicated with Decadron at home prior to cycle #2. She tolerated cycle #2 without significant acute toxicity aside from upper chest and facial erythema. 6. Marked neutropenia on labs, 11/22 and 12/19/2009, likely a delayed nadir related to rituximab. Resolved. 7. Macrocytic anemia-progressive. A laboratory evaluation on 01/14/2011 did not suggest hemolysis. A DAT was negative and the  reticulocyte count was low on 01/28/2011. Bone marrow aspiration/biopsy 02/07/2011 showed a hypercellular marrow for age with dyspoietic changes primarily involving the erythroid and megakaryocytic cell lines. There was no morphologic or immunophenotypic evidence of a B-cell lymphoproliferative process. Cytogenetics returned normal. She began a trial of weekly Procrit on 02/25/2011. She continues intermittent red cell transfusion support. Cycle 1 of 5 azacytidine started on 06/18/11. Cycle 2 of 5-azacytidine at a reduced dose beginning 08/05/2011. She received Neulasta support with cycle 2. Cycle 3 of 5-azacytidine beginning 09/09/2011. She again receive Neulasta support. 8. Severe neutropenia/progressive thrombocytopenia following cycle one 5-azacytidine. Persistent neutropenia/thrombocytopenia Cough and sore throat with abnormal lung exam on 08/19/2011. Negative chest x-ray. She completed a course of azithromycin. Her symptoms have resolved.    Disposition:  She has persistent transfusion-dependent anemia despite Procrit and 3 cycles of 5-azacytidine.5-azacytidine  will be delayed today secondary to neutropenia/thrombocytopenia. She will return for a fourth cycle of 5-azacytidine on 10/21/2011. We decided to discontinue Procrit therapy.  She will be transfused with packed red blood cells today.  I will refer Anne Doyle back to Dr. Lowell Guitar to get his opinion regarding additional treatment options.   Thornton Papas, MD  10/07/2011  11:47 AM

## 2011-10-08 ENCOUNTER — Ambulatory Visit: Payer: Medicare Other

## 2011-10-08 LAB — TYPE AND SCREEN: Unit division: 0

## 2011-10-09 ENCOUNTER — Ambulatory Visit: Payer: Medicare Other

## 2011-10-10 ENCOUNTER — Ambulatory Visit: Payer: Medicare Other

## 2011-10-11 ENCOUNTER — Telehealth: Payer: Self-pay | Admitting: Oncology

## 2011-10-11 ENCOUNTER — Ambulatory Visit: Payer: Medicare Other

## 2011-10-11 NOTE — Telephone Encounter (Signed)
s.w. pt and advised on sept/oct appts.....sed

## 2011-10-14 ENCOUNTER — Other Ambulatory Visit: Payer: Medicare Other | Admitting: Lab

## 2011-10-14 ENCOUNTER — Telehealth: Payer: Self-pay | Admitting: Oncology

## 2011-10-14 NOTE — Telephone Encounter (Signed)
Pt has a f/u appt with Dr.Powell @ [facility] 10/22/11 @ 12:30. Medical records faxed. Pt is aware.

## 2011-10-17 ENCOUNTER — Other Ambulatory Visit: Payer: Self-pay | Admitting: *Deleted

## 2011-10-18 ENCOUNTER — Telehealth: Payer: Self-pay | Admitting: *Deleted

## 2011-10-18 NOTE — Telephone Encounter (Signed)
due to appt at [facility]. Desk RN will give if necessary  Sent Anne Doyle email to see if the patient's vidaza could be moved to 10-22-2011 at 8:30am will let the desk nurse administator the vidaza

## 2011-10-20 ENCOUNTER — Other Ambulatory Visit: Payer: Self-pay | Admitting: Oncology

## 2011-10-21 ENCOUNTER — Other Ambulatory Visit: Payer: Self-pay | Admitting: *Deleted

## 2011-10-21 ENCOUNTER — Other Ambulatory Visit (HOSPITAL_BASED_OUTPATIENT_CLINIC_OR_DEPARTMENT_OTHER): Payer: Medicare Other | Admitting: Lab

## 2011-10-21 ENCOUNTER — Ambulatory Visit: Payer: Medicare Other

## 2011-10-21 VITALS — BP 120/72 | HR 80 | Temp 97.0°F | Resp 18

## 2011-10-21 DIAGNOSIS — B373 Candidiasis of vulva and vagina: Secondary | ICD-10-CM

## 2011-10-21 DIAGNOSIS — D649 Anemia, unspecified: Secondary | ICD-10-CM

## 2011-10-21 DIAGNOSIS — D469 Myelodysplastic syndrome, unspecified: Secondary | ICD-10-CM

## 2011-10-21 LAB — CBC WITH DIFFERENTIAL/PLATELET
BASO%: 0.5 % (ref 0.0–2.0)
HCT: 21.4 % — ABNORMAL LOW (ref 34.8–46.6)
HGB: 7.4 g/dL — ABNORMAL LOW (ref 11.6–15.9)
MCHC: 34.6 g/dL (ref 31.5–36.0)
MONO#: 0.1 10*3/uL (ref 0.1–0.9)
NEUT%: 66.3 % (ref 38.4–76.8)
WBC: 1.6 10*3/uL — ABNORMAL LOW (ref 3.9–10.3)
lymph#: 0.4 10*3/uL — ABNORMAL LOW (ref 0.9–3.3)

## 2011-10-21 LAB — PREPARE RBC (CROSSMATCH)

## 2011-10-21 MED ORDER — SODIUM CHLORIDE 0.9 % IV SOLN
250.0000 mL | Freq: Once | INTRAVENOUS | Status: AC
Start: 2011-10-21 — End: 2011-10-21
  Administered 2011-10-21: 50 mL via INTRAVENOUS

## 2011-10-21 NOTE — Patient Instructions (Signed)
Blood Transfusion Information  WHAT IS A BLOOD TRANSFUSION?  A transfusion is the replacement of blood or some of its parts. Blood is made up of multiple cells which provide different functions.   Red blood cells carry oxygen and are used for blood loss replacement.   White blood cells fight against infection.   Platelets control bleeding.   Plasma helps clot blood.   Other blood products are available for specialized needs, such as hemophilia or other clotting disorders.  BEFORE THE TRANSFUSION   Who gives blood for transfusions?    You may be able to donate blood to be used at a later date on yourself (autologous donation).   Relatives can be asked to donate blood. This is generally not any safer than if you have received blood from a stranger. The same precautions are taken to ensure safety when a relative's blood is donated.   Healthy volunteers who are fully evaluated to make sure their blood is safe. This is blood bank blood.  Transfusion therapy is the safest it has ever been in the practice of medicine. Before blood is taken from a donor, a complete history is taken to make sure that person has no history of diseases nor engages in risky social behavior (examples are intravenous drug use or sexual activity with multiple partners). The donor's travel history is screened to minimize risk of transmitting infections, such as malaria. The donated blood is tested for signs of infectious diseases, such as HIV and hepatitis. The blood is then tested to be sure it is compatible with you in order to minimize the chance of a transfusion reaction. If you or a relative donates blood, this is often done in anticipation of surgery and is not appropriate for emergency situations. It takes many days to process the donated blood.  RISKS AND COMPLICATIONS  Although transfusion therapy is very safe and saves many lives, the main dangers of transfusion include:    Getting an infectious disease.   Developing a  transfusion reaction. This is an allergic reaction to something in the blood you were given. Every precaution is taken to prevent this.  The decision to have a blood transfusion has been considered carefully by your caregiver before blood is given. Blood is not given unless the benefits outweigh the risks.  AFTER THE TRANSFUSION   Right after receiving a blood transfusion, you will usually feel much better and more energetic. This is especially true if your red blood cells have gotten low (anemic). The transfusion raises the level of the red blood cells which carry oxygen, and this usually causes an energy increase.   The nurse administering the transfusion will monitor you carefully for complications.  HOME CARE INSTRUCTIONS   No special instructions are needed after a transfusion. You may find your energy is better. Speak with your caregiver about any limitations on activity for underlying diseases you may have.  SEEK MEDICAL CARE IF:    Your condition is not improving after your transfusion.   You develop redness or irritation at the intravenous (IV) site.  SEEK IMMEDIATE MEDICAL CARE IF:   Any of the following symptoms occur over the next 12 hours:   Shaking chills.   You have a temperature by mouth above 102 F (38.9 C), not controlled by medicine.   Chest, back, or muscle pain.   People around you feel you are not acting correctly or are confused.   Shortness of breath or difficulty breathing.   Dizziness and fainting.     You get a rash or develop hives.   You have a decrease in urine output.   Your urine turns a dark color or changes to pink, red, or brown.  Any of the following symptoms occur over the next 10 days:   You have a temperature by mouth above 102 F (38.9 C), not controlled by medicine.   Shortness of breath.   Weakness after normal activity.   The white part of the eye turns yellow (jaundice).   You have a decrease in the amount of urine or are urinating less often.   Your  urine turns a dark color or changes to pink, red, or brown.  Document Released: 01/05/2000 Document Revised: 12/27/2010 Document Reviewed: 08/24/2007  [employer] Patient Information 2012 [employer].

## 2011-10-21 NOTE — Progress Notes (Signed)
Per Dr. Truett Perna, do not treat patient today due to counts - WBC 1.6, Hgb 7.4, Plt 38, ANC 1.1.  Conveyed to patient that treatment will be reviewed after she sees Dr. Lowell Guitar this week.  Patient symptomatic anemia & requesting transfusion, Dr. Truett Perna ordering two units blood.

## 2011-10-22 ENCOUNTER — Ambulatory Visit: Payer: Medicare Other

## 2011-10-22 LAB — TYPE AND SCREEN
ABO/RH(D): O POS
Antibody Screen: NEGATIVE
Unit division: 0

## 2011-10-23 ENCOUNTER — Ambulatory Visit: Payer: Medicare Other

## 2011-10-24 ENCOUNTER — Ambulatory Visit: Payer: Medicare Other

## 2011-10-25 ENCOUNTER — Ambulatory Visit: Payer: Medicare Other

## 2011-10-26 ENCOUNTER — Ambulatory Visit: Payer: Medicare Other

## 2011-10-27 ENCOUNTER — Other Ambulatory Visit: Payer: Self-pay | Admitting: Oncology

## 2011-10-28 ENCOUNTER — Ambulatory Visit (HOSPITAL_BASED_OUTPATIENT_CLINIC_OR_DEPARTMENT_OTHER): Payer: Medicare Other

## 2011-10-28 ENCOUNTER — Telehealth: Payer: Self-pay | Admitting: Oncology

## 2011-10-28 ENCOUNTER — Other Ambulatory Visit (HOSPITAL_BASED_OUTPATIENT_CLINIC_OR_DEPARTMENT_OTHER): Payer: Medicare Other | Admitting: Lab

## 2011-10-28 ENCOUNTER — Ambulatory Visit (HOSPITAL_BASED_OUTPATIENT_CLINIC_OR_DEPARTMENT_OTHER): Payer: Medicare Other | Admitting: Nurse Practitioner

## 2011-10-28 ENCOUNTER — Telehealth: Payer: Self-pay | Admitting: *Deleted

## 2011-10-28 VITALS — BP 143/80 | HR 102 | Temp 97.7°F | Resp 20 | Ht 65.0 in | Wt 148.4 lb

## 2011-10-28 VITALS — BP 137/83 | HR 77 | Temp 97.4°F

## 2011-10-28 DIAGNOSIS — D649 Anemia, unspecified: Secondary | ICD-10-CM

## 2011-10-28 DIAGNOSIS — B373 Candidiasis of vulva and vagina: Secondary | ICD-10-CM

## 2011-10-28 DIAGNOSIS — D6959 Other secondary thrombocytopenia: Secondary | ICD-10-CM

## 2011-10-28 DIAGNOSIS — D702 Other drug-induced agranulocytosis: Secondary | ICD-10-CM

## 2011-10-28 DIAGNOSIS — D469 Myelodysplastic syndrome, unspecified: Secondary | ICD-10-CM

## 2011-10-28 DIAGNOSIS — D0590 Unspecified type of carcinoma in situ of unspecified breast: Secondary | ICD-10-CM

## 2011-10-28 DIAGNOSIS — C9110 Chronic lymphocytic leukemia of B-cell type not having achieved remission: Secondary | ICD-10-CM

## 2011-10-28 LAB — CBC WITH DIFFERENTIAL/PLATELET
Basophils Absolute: 0 10*3/uL (ref 0.0–0.1)
EOS%: 1.7 % (ref 0.0–7.0)
HCT: 27.3 % — ABNORMAL LOW (ref 34.8–46.6)
HGB: 9.5 g/dL — ABNORMAL LOW (ref 11.6–15.9)
LYMPH%: 21.8 % (ref 14.0–49.7)
MCH: 30.4 pg (ref 25.1–34.0)
MCHC: 34.9 g/dL (ref 31.5–36.0)
MCV: 87.2 fL (ref 79.5–101.0)
MONO%: 6.9 % (ref 0.0–14.0)
NEUT%: 69.1 % (ref 38.4–76.8)
Platelets: 42 10*3/uL — ABNORMAL LOW (ref 145–400)
lymph#: 0.5 10*3/uL — ABNORMAL LOW (ref 0.9–3.3)

## 2011-10-28 MED ORDER — ONDANSETRON HCL 8 MG PO TABS
8.0000 mg | ORAL_TABLET | Freq: Once | ORAL | Status: AC
  Administered 2011-10-28: 8 mg via ORAL

## 2011-10-28 MED ORDER — AZACITIDINE CHEMO SQ INJECTION
50.0000 mg/m2 | Freq: Once | INTRAMUSCULAR | Status: AC
  Administered 2011-10-28: 87.5 mg via SUBCUTANEOUS
  Filled 2011-10-28: qty 17.5

## 2011-10-28 NOTE — Telephone Encounter (Signed)
appts made and printed for pt aom °

## 2011-10-28 NOTE — Patient Instructions (Signed)
[facility] Discharge Instructions for Patients Receiving Chemotherapy  Today you received the following chemotherapy agents Vidaza.  To help prevent nausea and vomiting after your treatment, we encourage you to take your nausea medication as prescribed.   If you develop nausea and vomiting that is not controlled by your nausea medication, call the clinic. If it is after clinic hours your family physician or the after hours number for the clinic or go to the Emergency Department.   BELOW ARE SYMPTOMS THAT SHOULD BE REPORTED IMMEDIATELY:  *FEVER GREATER THAN 100.5 F  *CHILLS WITH OR WITHOUT FEVER  NAUSEA AND VOMITING THAT IS NOT CONTROLLED WITH YOUR NAUSEA MEDICATION  *UNUSUAL SHORTNESS OF BREATH  *UNUSUAL BRUISING OR BLEEDING  TENDERNESS IN MOUTH AND THROAT WITH OR WITHOUT PRESENCE OF ULCERS  *URINARY PROBLEMS  *BOWEL PROBLEMS  UNUSUAL RASH Items with * indicate a potential emergency and should be followed up as soon as possible.  One of the nurses will contact you 24 hours after your treatment. Please let the nurse know about any problems that you may have experienced. Feel free to call the clinic you have any questions or concerns. The clinic phone number is [phone].      

## 2011-10-28 NOTE — Telephone Encounter (Signed)
Per staff phone call and POF I have scheduled appts. JMW  

## 2011-10-28 NOTE — Progress Notes (Signed)
Per Dr Truett Perna it is Ok to treat pt today with vidaza and platelets at [facility]

## 2011-10-28 NOTE — Progress Notes (Signed)
OFFICE PROGRESS NOTE  Interval history:  Anne Doyle returns as scheduled. She was transfused 2 units of blood on 10/21/2011 when the hemoglobin returned at 7.4. She notes significant improvement in her energy level since the blood transfusion. She denies shortness of breath or chest pain. No fevers. No cough. She denies bleeding.   Objective: Blood pressure 143/80, pulse 102, temperature 97.7 F (36.5 C), temperature source Oral, resp. rate 20, height 5\' 5"  (1.651 m), weight 148 lb 6.4 oz (67.314 kg).  Oropharynx is without thrush or ulceration. No palpable cervical, supraclavicular or axillary lymph nodes. Lungs are clear. Regular cardiac rhythm. Abdomen soft and nontender. No organomegaly. Extremities are without edema.  Lab Results: Lab Results  Component Value Date   WBC 2.2* 10/28/2011   HGB 9.5* 10/28/2011   HCT 27.3* 10/28/2011   MCV 87.2 10/28/2011   PLT 42* 10/28/2011    Chemistry:    Chemistry      Component Value Date/Time   NA 139 10/07/2011 1023   NA 141 09/02/2011 0928   K 3.9 10/07/2011 1023   K 4.0 09/02/2011 0928   CL 104 10/07/2011 1023   CL 105 09/02/2011 0928   CO2 26 10/07/2011 1023   CO2 28 09/02/2011 0928   BUN 13.0 10/07/2011 1023   BUN 15 09/02/2011 0928   CREATININE 0.8 10/07/2011 1023   CREATININE 0.71 09/02/2011 0928      Component Value Date/Time   CALCIUM 9.2 10/07/2011 1023   CALCIUM 8.7 09/02/2011 0928   ALKPHOS 68 10/07/2011 1023   ALKPHOS 73 09/02/2011 0928   AST 31 10/07/2011 1023   AST 13 09/02/2011 0928   ALT 61* 10/07/2011 1023   ALT 18 09/02/2011 0928   BILITOT 0.80 10/07/2011 1023   BILITOT 0.5 09/02/2011 0928       Studies/Results: No results found.  Medications: I have reviewed the patient's current medications.  Assessment/Plan:  1. Chronic lymphocytic leukemia, status post two cycles of fludarabine/rituximab with cycle #2 beginning on 05/08/2009. 2. Pancytopenia, status post a follow-up bone marrow biopsy, 09/01/2009, with involvement  by CLL identified.  3. History of a prolonged bleeding time. A diagnostic evaluation including a von Willebrand panel and platelet function studies were negative. 4. Left breast ductal carcinoma in situ, status post a needle localized lumpectomy, 07/19/2008, followed by left breast radiation. She continues tamoxifen. 5. Chills during the Rituxan infusion, 04/05/2009. She was premedicated with Decadron at home prior to cycle #2. She tolerated cycle #2 without significant acute toxicity aside from upper chest and facial erythema. 6. Marked neutropenia on labs, 11/22 and 12/19/2009, likely a delayed nadir related to rituximab. Resolved. 7. Macrocytic anemia-progressive. A laboratory evaluation on 01/14/2011 did not suggest hemolysis. A DAT was negative and the reticulocyte count was low on 01/28/2011. Bone marrow aspiration/biopsy 02/07/2011 showed a hypercellular marrow for age with dyspoietic changes primarily involving the erythroid and megakaryocytic cell lines. There was no morphologic or immunophenotypic evidence of a B-cell lymphoproliferative process. Cytogenetics returned normal. She began a trial of weekly Procrit on 02/25/2011. She continues intermittent red cell transfusion support. Cycle 1 of 5 azacytidine started on 06/18/11. Cycle 2 of 5-azacytidine at a reduced dose beginning 08/05/2011. She received Neulasta support with cycle 2. Cycle 3 of 5-azacytidine beginning 09/09/2011. She again receive Neulasta support. Procrit was discontinued following an office visit on 10/07/2011 due to no apparent benefit. 8. Severe neutropenia/progressive thrombocytopenia following cycle one 5-azacytidine. Persistent neutropenia/thrombocytopenia. 9. Cough and sore throat with abnormal lung exam on 08/19/2011.  Negative chest x-ray. She completed a course of azithromycin. Symptoms resolved.  Disposition-Anne Doyle has completed 3 cycles of 5-azacytidine. She continues to be transfusion dependent for red  cells.  She recently saw Dr. Lowell Guitar at [facility]. Dr. Truett Perna spoke with Dr. Lowell Guitar who has recommended proceeding with a fourth cycle of 5-azacytidine. If there is no improvement in the blood counts following cycle 4, Dr. Lowell Guitar recommends a bone marrow biopsy. Anne Doyle are most comfortable with 2 additional cycles of 5-azacytidine prior to a bone marrow biopsy.  Plan to proceed with cycle 4 5-azacytidine today as scheduled. We will continue to check counts on a weekly basis and provide transition support as needed. She will return for a followup visit in one month. She will contact the office the interim with any problems.  Plan reviewed with Dr. Truett Perna.    Anne Doyle Anne Doyle

## 2011-10-29 ENCOUNTER — Ambulatory Visit (HOSPITAL_BASED_OUTPATIENT_CLINIC_OR_DEPARTMENT_OTHER): Payer: Medicare Other

## 2011-10-29 DIAGNOSIS — C9110 Chronic lymphocytic leukemia of B-cell type not having achieved remission: Secondary | ICD-10-CM

## 2011-10-29 DIAGNOSIS — D469 Myelodysplastic syndrome, unspecified: Secondary | ICD-10-CM

## 2011-10-29 DIAGNOSIS — Z5111 Encounter for antineoplastic chemotherapy: Secondary | ICD-10-CM

## 2011-10-29 MED ORDER — ONDANSETRON HCL 8 MG PO TABS
8.0000 mg | ORAL_TABLET | Freq: Once | ORAL | Status: AC
  Administered 2011-10-29: 8 mg via ORAL

## 2011-10-29 MED ORDER — AZACITIDINE CHEMO SQ INJECTION
50.0000 mg/m2 | Freq: Once | INTRAMUSCULAR | Status: AC
  Administered 2011-10-29: 87.5 mg via SUBCUTANEOUS
  Filled 2011-10-29: qty 17.5

## 2011-10-29 NOTE — Patient Instructions (Signed)
[facility] Discharge Instructions for Patients Receiving Chemotherapy  Today you received the following chemotherapy agents Vidaza  To help prevent nausea and vomiting after your treatment, we encourage you to take your nausea medication as prescribed.    If you develop nausea and vomiting that is not controlled by your nausea medication, call the clinic. If it is after clinic hours your family physician or the after hours number for the clinic or go to the Emergency Department.   BELOW ARE SYMPTOMS THAT SHOULD BE REPORTED IMMEDIATELY:  *FEVER GREATER THAN 100.5 F  *CHILLS WITH OR WITHOUT FEVER  NAUSEA AND VOMITING THAT IS NOT CONTROLLED WITH YOUR NAUSEA MEDICATION  *UNUSUAL SHORTNESS OF BREATH  *UNUSUAL BRUISING OR BLEEDING  TENDERNESS IN MOUTH AND THROAT WITH OR WITHOUT PRESENCE OF ULCERS  *URINARY PROBLEMS  *BOWEL PROBLEMS  UNUSUAL RASH Items with * indicate a potential emergency and should be followed up as soon as possible.  One of the nurses will contact you 24 hours after your treatment. Please let the nurse know about any problems that you may have experienced. Feel free to call the clinic you have any questions or concerns. The clinic phone number is [phone].   I have been informed and understand all the instructions given to me. I know to contact the clinic, my physician, or go to the Emergency Department if any problems should occur. I do not have any questions at this time, but understand that I may call the clinic during office hours   should I have any questions or need assistance in obtaining follow up care.    __________________________________________  _____________  __________ Signature of Patient or Authorized Representative            Date                   Time    __________________________________________ Nurse's Signature    

## 2011-10-30 ENCOUNTER — Ambulatory Visit (HOSPITAL_BASED_OUTPATIENT_CLINIC_OR_DEPARTMENT_OTHER): Payer: Medicare Other

## 2011-10-30 VITALS — BP 159/81 | HR 92 | Temp 99.0°F | Resp 20

## 2011-10-30 DIAGNOSIS — C9110 Chronic lymphocytic leukemia of B-cell type not having achieved remission: Secondary | ICD-10-CM

## 2011-10-30 DIAGNOSIS — Z5111 Encounter for antineoplastic chemotherapy: Secondary | ICD-10-CM

## 2011-10-30 DIAGNOSIS — D469 Myelodysplastic syndrome, unspecified: Secondary | ICD-10-CM

## 2011-10-30 MED ORDER — ONDANSETRON HCL 8 MG PO TABS
8.0000 mg | ORAL_TABLET | Freq: Once | ORAL | Status: AC
  Administered 2011-10-30: 8 mg via ORAL

## 2011-10-30 MED ORDER — AZACITIDINE CHEMO SQ INJECTION
50.0000 mg/m2 | Freq: Once | INTRAMUSCULAR | Status: AC
  Administered 2011-10-30: 87.5 mg via SUBCUTANEOUS
  Filled 2011-10-30: qty 17.5

## 2011-10-30 NOTE — Patient Instructions (Signed)
[facility] Discharge Instructions for Patients Receiving Chemotherapy  Today you received the following chemotherapy agents  Vidaza To help prevent nausea and vomiting after your treatment, we encourage you to take your nausea medication   Take it as often as prescribed.   If you develop nausea and vomiting that is not controlled by your nausea medication, call the clinic. If it is after clinic hours your family physician or the after hours number for the clinic or go to the Emergency Department.   BELOW ARE SYMPTOMS THAT SHOULD BE REPORTED IMMEDIATELY:  *FEVER GREATER THAN 100.5 F  *CHILLS WITH OR WITHOUT FEVER  NAUSEA AND VOMITING THAT IS NOT CONTROLLED WITH YOUR NAUSEA MEDICATION  *UNUSUAL SHORTNESS OF BREATH  *UNUSUAL BRUISING OR BLEEDING  TENDERNESS IN MOUTH AND THROAT WITH OR WITHOUT PRESENCE OF ULCERS  *URINARY PROBLEMS  *BOWEL PROBLEMS  UNUSUAL RASH Items with * indicate a potential emergency and should be followed up as soon as possible.  If this is your first treatment one of the nurses will contact you 24 hours after your treatment. Please let the nurse know about any problems that you may have experienced. Feel free to call the clinic you have any questions or concerns. The clinic phone number is [phone].   I have been informed and understand all the instructions given to me. I know to contact the clinic, my physician, or go to the Emergency Department if any problems should occur. I do not have any questions at this time, but understand that I may call the clinic during office hours   should I have any questions or need assistance in obtaining follow up care.    __________________________________________  _____________  __________ Signature of Patient or Authorized Representative            Date                   Time    __________________________________________ Nurse's Signature    

## 2011-10-31 ENCOUNTER — Ambulatory Visit (HOSPITAL_BASED_OUTPATIENT_CLINIC_OR_DEPARTMENT_OTHER): Payer: Medicare Other

## 2011-10-31 DIAGNOSIS — D469 Myelodysplastic syndrome, unspecified: Secondary | ICD-10-CM

## 2011-10-31 DIAGNOSIS — C9110 Chronic lymphocytic leukemia of B-cell type not having achieved remission: Secondary | ICD-10-CM

## 2011-10-31 DIAGNOSIS — Z5111 Encounter for antineoplastic chemotherapy: Secondary | ICD-10-CM

## 2011-10-31 MED ORDER — ONDANSETRON HCL 8 MG PO TABS
8.0000 mg | ORAL_TABLET | Freq: Once | ORAL | Status: AC
  Administered 2011-10-31: 8 mg via ORAL

## 2011-10-31 MED ORDER — AZACITIDINE CHEMO SQ INJECTION
50.0000 mg/m2 | Freq: Once | INTRAMUSCULAR | Status: AC
  Administered 2011-10-31: 87.5 mg via SUBCUTANEOUS
  Filled 2011-10-31: qty 17.5

## 2011-10-31 NOTE — Patient Instructions (Signed)
Patient aware of next appointment; discharged home with no complaints. 

## 2011-11-01 ENCOUNTER — Ambulatory Visit (HOSPITAL_BASED_OUTPATIENT_CLINIC_OR_DEPARTMENT_OTHER): Payer: Medicare Other

## 2011-11-01 VITALS — BP 153/77 | HR 94 | Temp 98.6°F | Resp 20

## 2011-11-01 DIAGNOSIS — C9110 Chronic lymphocytic leukemia of B-cell type not having achieved remission: Secondary | ICD-10-CM

## 2011-11-01 DIAGNOSIS — D469 Myelodysplastic syndrome, unspecified: Secondary | ICD-10-CM

## 2011-11-01 MED ORDER — ONDANSETRON HCL 8 MG PO TABS
8.0000 mg | ORAL_TABLET | Freq: Once | ORAL | Status: AC
  Administered 2011-11-01: 8 mg via ORAL

## 2011-11-01 MED ORDER — AZACITIDINE CHEMO SQ INJECTION
50.0000 mg/m2 | Freq: Once | INTRAMUSCULAR | Status: AC
  Administered 2011-11-01: 87.5 mg via SUBCUTANEOUS
  Filled 2011-11-01: qty 17.5

## 2011-11-01 NOTE — Patient Instructions (Signed)
[facility] Discharge Instructions for Patients Receiving Chemotherapy  Today you received the following chemotherapy agent Vidaza.  To help prevent nausea and vomiting after your treatment, we encourage you to take your nausea medication. Take your nausea medication as often as prescribed for by Dr. Truett Perna.    If you develop nausea and vomiting that is not controlled by your nausea medication, call the clinic. If it is after clinic hours your family physician or the after hours number for the clinic or go to the Emergency Department.   BELOW ARE SYMPTOMS THAT SHOULD BE REPORTED IMMEDIATELY:  *FEVER GREATER THAN 100.5 F  *CHILLS WITH OR WITHOUT FEVER  NAUSEA AND VOMITING THAT IS NOT CONTROLLED WITH YOUR NAUSEA MEDICATION  *UNUSUAL SHORTNESS OF BREATH  *UNUSUAL BRUISING OR BLEEDING  TENDERNESS IN MOUTH AND THROAT WITH OR WITHOUT PRESENCE OF ULCERS  *URINARY PROBLEMS  *BOWEL PROBLEMS  UNUSUAL RASH Items with * indicate a potential emergency and should be followed up as soon as possible.  One of the nurses will contact you 24 hours after your treatment. Please let the nurse know about any problems that you may have experienced. Feel free to call the clinic you have any questions or concerns. The clinic phone number is [phone].   I have been informed and understand all the instructions given to me. I know to contact the clinic, my physician, or go to the Emergency Department if any problems should occur. I do not have any questions at this time, but understand that I may call the clinic during office hours   should I have any questions or need assistance in obtaining follow up care.    __________________________________________  _____________  __________ Signature of Patient or Authorized Representative            Date                   Time    __________________________________________ Nurse's Signature       AZACITIDINE (ay za SITE i deen) is a  chemotherapy drug. This medicine reduces the growth of cancer cells and can suppress the immune system. It is used for treating myelodysplastic syndrome or some types of leukemia. This medicine may be used for other purposes; ask your health care provider or pharmacist if you have questions. What should I tell my health care provider before I take this medicine? They need to know if you have any of these conditions: -infection (especially a virus infection such as chickenpox, cold sores, or herpes) -kidney disease -liver disease -liver tumors -an unusual or allergic reaction to azacitidine, mannitol, other medicines, foods, dyes, or preservatives -pregnant or trying to get pregnant -breast-feeding How should I use this medicine? This medicine is for injection under the skin. It is administered in a hospital or clinic by a specially trained health care professional. Talk to your pediatrician regarding the use of this medicine in children. While this drug may be prescribed for selected conditions, precautions do apply. Overdosage: If you think you have taken too much of this medicine contact a poison control center or emergency room at once. NOTE: This medicine is only for you. Do not share this medicine with others. What if I miss a dose? It is important not to miss your dose. Call your doctor or health care professional if you are unable to keep an appointment. What may interact with this medicine? -vaccines Talk to your doctor or health care professional before taking any of these  medicines: -acetaminophen -aspirin -ibuprofen -ketoprofen -naproxen This list may not describe all possible interactions. Give your health care provider a list of all the medicines, herbs, non-prescription drugs, or dietary supplements you use. Also tell them if you smoke, drink alcohol, or use illegal drugs. Some items may interact with your medicine. What should I watch for while using this medicine? Visit  your doctor for checks on your progress. This drug may make you feel generally unwell. This is not uncommon, as chemotherapy can affect healthy cells as well as cancer cells. Report any side effects. Continue your course of treatment even though you feel ill unless your doctor tells you to stop. In some cases, you may be given additional medicines to help with side effects. Follow all directions for their use. Call your doctor or health care professional for advice if you get a fever, chills or sore throat, or other symptoms of a cold or flu. Do not treat yourself. This drug decreases your body's ability to fight infections. Try to avoid being around people who are sick. This medicine may increase your risk to bruise or bleed. Call your doctor or health care professional if you notice any unusual bleeding. Be careful brushing and flossing your teeth or using a toothpick because you may get an infection or bleed more easily. If you have any dental work done, tell your dentist you are receiving this medicine. Avoid taking products that contain aspirin, acetaminophen, ibuprofen, naproxen, or ketoprofen unless instructed by your doctor. These medicines may hide a fever. Do not have any vaccinations without your doctor's approval and avoid anyone who has recently had oral polio vaccine. Do not become pregnant while taking this medicine. Women should inform their doctor if they wish to become pregnant or think they might be pregnant. There is a potential for serious side effects to an unborn child. Talk to your health care professional or pharmacist for more information. Do not breast-feed an infant while taking this medicine. If you are a man, you should not father a child while receiving treatment. What side effects may I notice from receiving this medicine? Side effects that you should report to your doctor or health care professional as soon as possible: -allergic reactions like skin rash, itching or hives,  swelling of the face, lips, or tongue -low blood counts - this medicine may decrease the number of white blood cells, red blood cells and platelets. You may be at increased risk for infections and bleeding. -signs of infection - fever or chills, cough, sore throat, pain or difficulty passing urine -signs of decreased platelets or bleeding - bruising, pinpoint red spots on the skin, black, tarry stools, blood in the urine -signs of decreased red blood cells - unusually weak or tired, fainting spells, lightheadedness -reactions at the injection site including redness, pain, itching, or bruising -breathing problems -changes in vision -fever -mouth sores -stomach pain -vomiting Side effects that usually do not require medical attention (report to your doctor or health care professional if they continue or are bothersome): -constipation -diarrhea -loss of appetite -nausea -pain or redness at the injection site -weak or tired This list may not describe all possible side effects. Call your doctor for medical advice about side effects. You may report side effects to FDA at 1-800-FDA-1088. Where should I keep my medicine? This drug is given in a hospital or clinic and will not be stored at home. NOTE: This sheet is a summary. It may not cover all possible information. If you  have questions about this medicine, talk to your doctor, pharmacist, or health care provider.  2012, [employer]. (04/02/2007 11:04:07 AM)

## 2011-11-02 ENCOUNTER — Ambulatory Visit (HOSPITAL_BASED_OUTPATIENT_CLINIC_OR_DEPARTMENT_OTHER): Payer: Medicare Other

## 2011-11-02 VITALS — BP 142/68 | HR 88 | Temp 97.9°F | Resp 20

## 2011-11-02 DIAGNOSIS — D469 Myelodysplastic syndrome, unspecified: Secondary | ICD-10-CM

## 2011-11-02 MED ORDER — EPOETIN ALFA 20000 UNIT/ML IJ SOLN
60000.0000 [IU] | Freq: Once | INTRAMUSCULAR | Status: DC
  Administered 2011-11-02: 60000 [IU] via SUBCUTANEOUS

## 2011-11-04 ENCOUNTER — Telehealth: Payer: Self-pay | Admitting: *Deleted

## 2011-11-04 ENCOUNTER — Other Ambulatory Visit (HOSPITAL_BASED_OUTPATIENT_CLINIC_OR_DEPARTMENT_OTHER): Payer: Medicare Other | Admitting: Lab

## 2011-11-04 ENCOUNTER — Ambulatory Visit (HOSPITAL_BASED_OUTPATIENT_CLINIC_OR_DEPARTMENT_OTHER): Payer: Medicare Other

## 2011-11-04 VITALS — BP 135/70 | HR 99 | Temp 97.5°F

## 2011-11-04 DIAGNOSIS — D649 Anemia, unspecified: Secondary | ICD-10-CM

## 2011-11-04 DIAGNOSIS — C9110 Chronic lymphocytic leukemia of B-cell type not having achieved remission: Secondary | ICD-10-CM

## 2011-11-04 DIAGNOSIS — D469 Myelodysplastic syndrome, unspecified: Secondary | ICD-10-CM

## 2011-11-04 LAB — CBC WITH DIFFERENTIAL/PLATELET
BASO%: 0.7 % (ref 0.0–2.0)
Eosinophils Absolute: 0 10*3/uL (ref 0.0–0.5)
HCT: 24.2 % — ABNORMAL LOW (ref 34.8–46.6)
MCHC: 34.8 g/dL (ref 31.5–36.0)
MONO#: 0.1 10*3/uL (ref 0.1–0.9)
NEUT#: 1.6 10*3/uL (ref 1.5–6.5)
NEUT%: 71.8 % (ref 38.4–76.8)
WBC: 2.2 10*3/uL — ABNORMAL LOW (ref 3.9–10.3)
lymph#: 0.5 10*3/uL — ABNORMAL LOW (ref 0.9–3.3)

## 2011-11-04 MED ORDER — PEGFILGRASTIM INJECTION 6 MG/0.6ML
6.0000 mg | Freq: Once | SUBCUTANEOUS | Status: AC
  Administered 2011-11-04: 6 mg via SUBCUTANEOUS
  Filled 2011-11-04: qty 0.6

## 2011-11-04 NOTE — Telephone Encounter (Signed)
Orders placed.

## 2011-11-11 ENCOUNTER — Encounter (HOSPITAL_COMMUNITY)
Admission: RE | Admit: 2011-11-11 | Discharge: 2011-11-11 | Disposition: A | Discharge status: Home / Self Care | Payer: Medicare Other | Source: Ambulatory Visit | Attending: Oncology | Admitting: Oncology

## 2011-11-11 ENCOUNTER — Telehealth: Payer: Self-pay | Admitting: *Deleted

## 2011-11-11 ENCOUNTER — Other Ambulatory Visit (HOSPITAL_BASED_OUTPATIENT_CLINIC_OR_DEPARTMENT_OTHER): Payer: Medicare Other | Admitting: Lab

## 2011-11-11 ENCOUNTER — Other Ambulatory Visit: Payer: Self-pay | Admitting: *Deleted

## 2011-11-11 DIAGNOSIS — D649 Anemia, unspecified: Secondary | ICD-10-CM | POA: Insufficient documentation

## 2011-11-11 DIAGNOSIS — B3731 Acute candidiasis of vulva and vagina: Secondary | ICD-10-CM | POA: Insufficient documentation

## 2011-11-11 DIAGNOSIS — D469 Myelodysplastic syndrome, unspecified: Secondary | ICD-10-CM

## 2011-11-11 DIAGNOSIS — B373 Candidiasis of vulva and vagina: Secondary | ICD-10-CM | POA: Insufficient documentation

## 2011-11-11 LAB — CBC WITH DIFFERENTIAL/PLATELET
Basophils Absolute: 0 10*3/uL (ref 0.0–0.1)
HCT: 19.1 % — ABNORMAL LOW (ref 34.8–46.6)
HGB: 6.5 g/dL — CL (ref 11.6–15.9)
MCH: 29.8 pg (ref 25.1–34.0)
MONO#: 0 10*3/uL — ABNORMAL LOW (ref 0.1–0.9)
NEUT%: 80.1 % — ABNORMAL HIGH (ref 38.4–76.8)
Platelets: 15 10*3/uL — ABNORMAL LOW (ref 145–400)
WBC: 2.6 10*3/uL — ABNORMAL LOW (ref 3.9–10.3)
lymph#: 0.4 10*3/uL — ABNORMAL LOW (ref 0.9–3.3)

## 2011-11-11 LAB — PREPARE RBC (CROSSMATCH)

## 2011-11-11 NOTE — Progress Notes (Signed)
MD review of counts today. Will transfuse 2 units blood on 10/22 and recheck counts to follow up on platelets. Patient notified. Will request RN draw CBC with IV start.

## 2011-11-11 NOTE — Telephone Encounter (Signed)
Per staff voice mail and POF I have scheduled appts. JMW

## 2011-11-12 ENCOUNTER — Other Ambulatory Visit (HOSPITAL_BASED_OUTPATIENT_CLINIC_OR_DEPARTMENT_OTHER): Payer: Medicare Other | Admitting: Lab

## 2011-11-12 ENCOUNTER — Telehealth: Payer: Self-pay | Admitting: Oncology

## 2011-11-12 ENCOUNTER — Other Ambulatory Visit: Payer: Self-pay | Admitting: *Deleted

## 2011-11-12 ENCOUNTER — Ambulatory Visit: Payer: Medicare Other

## 2011-11-12 VITALS — BP 128/78 | HR 84 | Temp 98.3°F | Resp 20

## 2011-11-12 DIAGNOSIS — D469 Myelodysplastic syndrome, unspecified: Secondary | ICD-10-CM

## 2011-11-12 DIAGNOSIS — D649 Anemia, unspecified: Secondary | ICD-10-CM

## 2011-11-12 DIAGNOSIS — B373 Candidiasis of vulva and vagina: Secondary | ICD-10-CM

## 2011-11-12 LAB — CBC WITH DIFFERENTIAL/PLATELET
EOS%: 0.8 % (ref 0.0–7.0)
MCH: 29.9 pg (ref 25.1–34.0)
MCV: 87.9 fL (ref 79.5–101.0)
MONO%: 1.3 % (ref 0.0–14.0)
RBC: 2.11 10*6/uL — ABNORMAL LOW (ref 3.70–5.45)
RDW: 12.7 % (ref 11.2–14.5)

## 2011-11-12 LAB — PREPARE RBC (CROSSMATCH)

## 2011-11-12 MED ORDER — SODIUM CHLORIDE 0.9 % IV SOLN: 250.0000 mL | Freq: Once | INTRAVENOUS | Status: DC

## 2011-11-12 NOTE — Telephone Encounter (Signed)
Called patient regarding lab on 11/15/11,left message

## 2011-11-13 LAB — TYPE AND SCREEN
Antibody Screen: NEGATIVE
Unit division: 0

## 2011-11-15 ENCOUNTER — Other Ambulatory Visit (HOSPITAL_BASED_OUTPATIENT_CLINIC_OR_DEPARTMENT_OTHER): Payer: Medicare Other | Admitting: Lab

## 2011-11-15 ENCOUNTER — Telehealth: Payer: Self-pay | Admitting: *Deleted

## 2011-11-15 DIAGNOSIS — D469 Myelodysplastic syndrome, unspecified: Secondary | ICD-10-CM

## 2011-11-15 LAB — CBC WITH DIFFERENTIAL/PLATELET
Basophils Absolute: 0 10*3/uL (ref 0.0–0.1)
HCT: 26 % — ABNORMAL LOW (ref 34.8–46.6)
HGB: 8.8 g/dL — ABNORMAL LOW (ref 11.6–15.9)
LYMPH%: 24.6 % (ref 14.0–49.7)
MCH: 30.4 pg (ref 25.1–34.0)
MONO#: 0 10*3/uL — ABNORMAL LOW (ref 0.1–0.9)
NEUT%: 70.5 % (ref 38.4–76.8)
Platelets: 18 10*3/uL — ABNORMAL LOW (ref 145–400)
WBC: 1.2 10*3/uL — ABNORMAL LOW (ref 3.9–10.3)
lymph#: 0.3 10*3/uL — ABNORMAL LOW (ref 0.9–3.3)

## 2011-11-15 NOTE — Telephone Encounter (Signed)
MD review of CBC. F/U on 10/28 as scheduled. Called and left VM for patient reviewing Neutropenic Precautions and to call for fever or s/s of infection. F/U 10/28.

## 2011-11-18 ENCOUNTER — Telehealth: Payer: Self-pay | Admitting: *Deleted

## 2011-11-18 ENCOUNTER — Other Ambulatory Visit (HOSPITAL_BASED_OUTPATIENT_CLINIC_OR_DEPARTMENT_OTHER): Payer: Medicare Other | Admitting: Lab

## 2011-11-18 DIAGNOSIS — D649 Anemia, unspecified: Secondary | ICD-10-CM

## 2011-11-18 DIAGNOSIS — D469 Myelodysplastic syndrome, unspecified: Secondary | ICD-10-CM

## 2011-11-18 LAB — CBC WITH DIFFERENTIAL/PLATELET
BASO%: 2.3 % — ABNORMAL HIGH (ref 0.0–2.0)
Basophils Absolute: 0 10*3/uL (ref 0.0–0.1)
EOS%: 2.3 % (ref 0.0–7.0)
Eosinophils Absolute: 0 10*3/uL (ref 0.0–0.5)
HCT: 24.5 % — ABNORMAL LOW (ref 34.8–46.6)
HGB: 8.4 g/dL — ABNORMAL LOW (ref 11.6–15.9)
LYMPH%: 33.1 % (ref 14.0–49.7)
MCH: 30.6 pg (ref 25.1–34.0)
MCHC: 34.2 g/dL (ref 31.5–36.0)
MCV: 89.4 fL (ref 79.5–101.0)
MONO#: 0 10*3/uL — ABNORMAL LOW (ref 0.1–0.9)
MONO%: 2.5 % (ref 0.0–14.0)
NEUT#: 0.5 10*3/uL — CL (ref 1.5–6.5)
NEUT%: 59.8 % (ref 38.4–76.8)
Platelets: 29 10*3/uL — ABNORMAL LOW (ref 145–400)
RBC: 2.74 10*6/uL — ABNORMAL LOW (ref 3.70–5.45)
RDW: 12.7 % (ref 11.2–14.5)
WBC: 0.8 10*3/uL — CL (ref 3.9–10.3)
lymph#: 0.3 10*3/uL — ABNORMAL LOW (ref 0.9–3.3)

## 2011-11-18 LAB — HOLD TUBE, BLOOD BANK

## 2011-11-18 NOTE — Telephone Encounter (Signed)
MD review of labs. F/U 11/4 Patient feeling well, no fever or s/s of infection. Reviewed Neutropenic Precautions. Follow up as scheduled on 11/4.

## 2011-11-24 ENCOUNTER — Other Ambulatory Visit: Payer: Self-pay | Admitting: Oncology

## 2011-11-25 ENCOUNTER — Ambulatory Visit (HOSPITAL_BASED_OUTPATIENT_CLINIC_OR_DEPARTMENT_OTHER): Payer: Medicare Other | Admitting: Oncology

## 2011-11-25 ENCOUNTER — Encounter (HOSPITAL_COMMUNITY)
Admission: RE | Admit: 2011-11-25 | Discharge: 2011-11-25 | Disposition: A | Discharge status: Home / Self Care | Payer: Medicare Other | Source: Ambulatory Visit | Attending: Oncology | Admitting: Oncology

## 2011-11-25 ENCOUNTER — Ambulatory Visit (HOSPITAL_BASED_OUTPATIENT_CLINIC_OR_DEPARTMENT_OTHER): Payer: Medicare Other

## 2011-11-25 ENCOUNTER — Ambulatory Visit (HOSPITAL_BASED_OUTPATIENT_CLINIC_OR_DEPARTMENT_OTHER): Payer: Medicare Other | Admitting: Lab

## 2011-11-25 VITALS — BP 144/71 | HR 95 | Temp 97.7°F | Resp 20 | Ht 65.0 in | Wt 152.3 lb

## 2011-11-25 VITALS — BP 114/63 | HR 83 | Temp 97.3°F | Resp 20

## 2011-11-25 DIAGNOSIS — D469 Myelodysplastic syndrome, unspecified: Secondary | ICD-10-CM

## 2011-11-25 DIAGNOSIS — D649 Anemia, unspecified: Secondary | ICD-10-CM | POA: Insufficient documentation

## 2011-11-25 DIAGNOSIS — D539 Nutritional anemia, unspecified: Secondary | ICD-10-CM

## 2011-11-25 DIAGNOSIS — D0590 Unspecified type of carcinoma in situ of unspecified breast: Secondary | ICD-10-CM

## 2011-11-25 DIAGNOSIS — C9110 Chronic lymphocytic leukemia of B-cell type not having achieved remission: Secondary | ICD-10-CM

## 2011-11-25 DIAGNOSIS — D709 Neutropenia, unspecified: Secondary | ICD-10-CM

## 2011-11-25 LAB — CBC WITH DIFFERENTIAL/PLATELET
Basophils Absolute: 0 10*3/uL (ref 0.0–0.1)
EOS%: 4.7 % (ref 0.0–7.0)
HCT: 19.8 % — ABNORMAL LOW (ref 34.8–46.6)
HGB: 6.6 g/dL — CL (ref 11.6–15.9)
MONO#: 0 10*3/uL — ABNORMAL LOW (ref 0.1–0.9)
NEUT%: 47.1 % (ref 38.4–76.8)
RDW: 12.5 % (ref 11.2–14.5)
lymph#: 0.4 10*3/uL — ABNORMAL LOW (ref 0.9–3.3)

## 2011-11-25 MED ORDER — SODIUM CHLORIDE 0.9 % IV SOLN: 250.0000 mL | Freq: Once | INTRAVENOUS | Status: DC

## 2011-11-25 NOTE — Progress Notes (Signed)
   [facility]    OFFICE PROGRESS NOTE   INTERVAL HISTORY:   She returns as scheduled. No fever or bleeding. Mild malaise. No symptoms of recurrent bronchitis. She completed another cycle of azacytidine beginning on 10/28/2011. No nausea, mouth sores, or diarrhea following chemotherapy.  Objective:  Vital signs in last 24 hours:  Blood pressure 144/71, pulse 95, temperature 97.7 F (36.5 C), temperature source Oral, resp. rate 20, height 5\' 5"  (1.651 m), weight 152 lb 4.8 oz (69.083 kg).    HEENT: No thrush or ulcers Resp: Lungs clear bilaterally Cardio: Regular rate and rhythm GI: No hepatosplenomegaly Vascular: No leg edema   Lab Results:  Lab Results  Component Value Date   WBC 0.9* 11/25/2011   HGB 6.6* 11/25/2011   HCT 19.8* 11/25/2011   MCV 88.0 11/25/2011   PLT 25* 11/25/2011   ANC 0.4    Medications: I have reviewed the patient's current medications.  Assessment/Plan: 1. Chronic lymphocytic leukemia, status post two cycles of fludarabine/rituximab with cycle #2 beginning on 05/08/2009. 2. Pancytopenia, status post a follow-up bone marrow biopsy, 09/01/2009, with involvement by CLL identified.  3. History of a prolonged bleeding time. A diagnostic evaluation including a von Willebrand panel and platelet function studies were negative. 4. Left breast ductal carcinoma in situ, status post a needle localized lumpectomy, 07/19/2008, followed by left breast radiation. She continues tamoxifen. 5. Chills during the Rituxan infusion, 04/05/2009. She was premedicated with Decadron at home prior to cycle #2. She tolerated cycle #2 without significant acute toxicity aside from upper chest and facial erythema. 6. Marked neutropenia on labs, 11/22 and 12/19/2009, likely a delayed nadir related to rituximab. Resolved. 7. Macrocytic anemia-progressive. A laboratory evaluation on 01/14/2011 did not suggest hemolysis. A DAT was negative and the reticulocyte count was  low on 01/28/2011. Bone marrow aspiration/biopsy 02/07/2011 showed a hypercellular marrow for age with dyspoietic changes primarily involving the erythroid and megakaryocytic cell lines. There was no morphologic or immunophenotypic evidence of a B-cell lymphoproliferative process. Cytogenetics returned normal. She began a trial of weekly Procrit on 02/25/2011. She continues intermittent red cell transfusion support. Cycle 1 of 5 azacytidine started on 06/18/11. Cycle 2 of 5-azacytidine at a reduced dose beginning 08/05/2011. She received Neulasta support with cycle 2. Cycle 3 of 5-azacytidine beginning 09/09/2011. She again receive Neulasta support. Procrit was discontinued following an office visit on 10/07/2011 due to no apparent benefit. Cycle 4 of 5-azacytidine on 10/28/2011. 8. neutropenia/progressive thrombocytopenia following 5-azacytidine. Persistent 9. Cough and sore throat with abnormal lung exam on 08/19/2011. Negative chest x-ray. She completed a course of azithromycin. Symptoms resolved.    Disposition:  She completed cycle 4 of chemotherapy on 10/28/2011. She continues to tolerate the chemotherapy well, but the red cell transfusion requirement has not improved. Ms. Massaro will be scheduled for a red cell transfusion today. The plan is to complete a fifth cycle of chemotherapy beginning on 12/02/2011. She will undergo a restaging bone marrow biopsy after 1 or 2 more cycles of chemotherapy. Ms. Converse is to contact us for a fever or bleeding.   Thornton Papas, MD  11/25/2011  2:46 PM

## 2011-11-25 NOTE — Patient Instructions (Signed)
Call for fever or bleeding °

## 2011-11-26 ENCOUNTER — Ambulatory Visit: Payer: Medicare Other

## 2011-11-26 LAB — TYPE AND SCREEN: Unit division: 0

## 2011-11-27 ENCOUNTER — Ambulatory Visit: Payer: Medicare Other

## 2011-11-28 ENCOUNTER — Ambulatory Visit: Payer: Medicare Other

## 2011-11-28 ENCOUNTER — Telehealth: Payer: Self-pay | Admitting: *Deleted

## 2011-11-28 NOTE — Telephone Encounter (Signed)
Patient confirmed over the phone the new date and time 

## 2011-11-29 ENCOUNTER — Ambulatory Visit: Payer: Medicare Other

## 2011-11-30 ENCOUNTER — Ambulatory Visit: Payer: Medicare Other

## 2011-12-02 ENCOUNTER — Ambulatory Visit: Payer: Medicare Other

## 2011-12-02 ENCOUNTER — Telehealth: Payer: Self-pay | Admitting: Oncology

## 2011-12-02 ENCOUNTER — Ambulatory Visit (HOSPITAL_BASED_OUTPATIENT_CLINIC_OR_DEPARTMENT_OTHER): Payer: Medicare Other | Admitting: Nurse Practitioner

## 2011-12-02 ENCOUNTER — Other Ambulatory Visit (HOSPITAL_BASED_OUTPATIENT_CLINIC_OR_DEPARTMENT_OTHER): Payer: Medicare Other | Admitting: Lab

## 2011-12-02 VITALS — BP 175/70 | HR 87 | Temp 97.2°F | Resp 20 | Ht 65.0 in | Wt 152.9 lb

## 2011-12-02 DIAGNOSIS — D469 Myelodysplastic syndrome, unspecified: Secondary | ICD-10-CM

## 2011-12-02 DIAGNOSIS — D696 Thrombocytopenia, unspecified: Secondary | ICD-10-CM

## 2011-12-02 DIAGNOSIS — D0590 Unspecified type of carcinoma in situ of unspecified breast: Secondary | ICD-10-CM

## 2011-12-02 DIAGNOSIS — D709 Neutropenia, unspecified: Secondary | ICD-10-CM

## 2011-12-02 DIAGNOSIS — C9110 Chronic lymphocytic leukemia of B-cell type not having achieved remission: Secondary | ICD-10-CM

## 2011-12-02 LAB — CBC WITH DIFFERENTIAL/PLATELET
BASO%: 0 % (ref 0.0–2.0)
EOS%: 2.3 % (ref 0.0–7.0)
LYMPH%: 48.8 % (ref 14.0–49.7)
MCHC: 33 g/dL (ref 31.5–36.0)
MCV: 87.5 fL (ref 79.5–101.0)
MONO%: 5.8 % (ref 0.0–14.0)
Platelets: 11 10*3/uL — ABNORMAL LOW (ref 145–400)
RBC: 3.05 10*6/uL — ABNORMAL LOW (ref 3.70–5.45)
WBC: 0.9 10*3/uL — CL (ref 3.9–10.3)

## 2011-12-02 LAB — COMPREHENSIVE METABOLIC PANEL (CC13)
CO2: 30 mEq/L — ABNORMAL HIGH (ref 22–29)
Creatinine: 0.8 mg/dL (ref 0.6–1.1)
Glucose: 167 mg/dl — ABNORMAL HIGH (ref 70–99)
Total Bilirubin: 0.73 mg/dL (ref 0.20–1.20)

## 2011-12-02 NOTE — Progress Notes (Signed)
OFFICE PROGRESS NOTE  Interval history:  Anne Doyle returns as scheduled. She noted improvement in her energy level following the blood transfusion last week. She denies bleeding. She does note easy bruising. No fever. No shaking chills. She denies dysuria. No shortness of breath or cough. No unusual headaches. No vision change.   Objective: Blood pressure 175/70, pulse 87, temperature 97.2 F (36.2 C), temperature source Oral, resp. rate 20, height 5\' 5"  (1.651 m), weight 152 lb 14.4 oz (69.355 kg).  Oropharynx is without thrush or ulceration. Lungs clear. Regular cardiac rhythm. Abdomen soft and nontender. No organomegaly. Extremities are without edema.  Lab Results: Lab Results  Component Value Date   WBC 0.9* 12/02/2011   HGB 8.8* 12/02/2011   HCT 26.7* 12/02/2011   MCV 87.5 12/02/2011   PLT 11* 12/02/2011    Chemistry:    Chemistry      Component Value Date/Time   NA 141 12/02/2011 1321   NA 141 09/02/2011 0928   K 4.0 12/02/2011 1321   K 4.0 09/02/2011 0928   CL 105 12/02/2011 1321   CL 105 09/02/2011 0928   CO2 30* 12/02/2011 1321   CO2 28 09/02/2011 0928   BUN 18.0 12/02/2011 1321   BUN 15 09/02/2011 0928   CREATININE 0.8 12/02/2011 1321   CREATININE 0.71 09/02/2011 0928      Component Value Date/Time   CALCIUM 9.8 12/02/2011 1321   CALCIUM 8.7 09/02/2011 0928   ALKPHOS 82 12/02/2011 1321   ALKPHOS 73 09/02/2011 0928   AST 25 12/02/2011 1321   AST 13 09/02/2011 0928   ALT 51 12/02/2011 1321   ALT 18 09/02/2011 0928   BILITOT 0.73 12/02/2011 1321   BILITOT 0.5 09/02/2011 0928       Studies/Results: No results found.  Medications: I have reviewed the patient's current medications.  Assessment/Plan:  1. Chronic lymphocytic leukemia, status post two cycles of fludarabine/rituximab with cycle #2 beginning on 05/08/2009. 2. Pancytopenia, status post a follow-up bone marrow biopsy, 09/01/2009, with involvement by CLL identified.  3. History of a prolonged  bleeding time. A diagnostic evaluation including a von Willebrand panel and platelet function studies were negative. 4. Left breast ductal carcinoma in situ, status post a needle localized lumpectomy, 07/19/2008, followed by left breast radiation. She continues tamoxifen. 5. Chills during the Rituxan infusion, 04/05/2009. She was premedicated with Decadron at home prior to cycle #2. She tolerated cycle #2 without significant acute toxicity aside from upper chest and facial erythema. 6. Marked neutropenia on labs, 11/22 and 12/19/2009, likely a delayed nadir related to rituximab. Resolved. 7. Macrocytic anemia-progressive. A laboratory evaluation on 01/14/2011 did not suggest hemolysis. A DAT was negative and the reticulocyte count was low on 01/28/2011. Bone marrow aspiration/biopsy 02/07/2011 showed a hypercellular marrow for age with dyspoietic changes primarily involving the erythroid and megakaryocytic cell lines. There was no morphologic or immunophenotypic evidence of a B-cell lymphoproliferative process. Cytogenetics returned normal. She began a trial of weekly Procrit on 02/25/2011. She continues intermittent red cell transfusion support. Cycle 1 of 5 azacytidine started on 06/18/11. Cycle 2 of 5-azacytidine at a reduced dose beginning 08/05/2011. She received Neulasta support with cycle 2. Cycle 3 of 5-azacytidine beginning 09/09/2011. She again receive Neulasta support. Procrit was discontinued following an office visit on 10/07/2011 due to no apparent benefit. Cycle 4 of 5-azacytidine on 10/28/2011. 8. Neutropenia/progressive thrombocytopenia following 5-azacytidine. Progressive. 9. Cough and sore throat with abnormal lung exam on 08/19/2011. Negative chest x-ray. She completed a course of  azithromycin. Symptoms resolved.  Disposition-Ms. Stribling has persistent neutropenia and progressive thrombocytopenia. She will return for a followup CBC 12/03/2011. She will receive a platelet transfusion if  the platelet count returns at less than 10,000. We are holding the next cycle of 5-azacytidine and rescheduling for one week. She will return for a followup visit in 2 weeks. She understands to contact the office immediately with high fever, shaking chills, other signs of infection, any bleeding.  Plan reviewed with Dr. Truett Perna.  Lonna Cobb ANP/GNP-BC

## 2011-12-02 NOTE — Telephone Encounter (Signed)
gv and printed pt appt schedule for NOV °

## 2011-12-03 ENCOUNTER — Ambulatory Visit: Payer: Medicare Other

## 2011-12-03 ENCOUNTER — Other Ambulatory Visit: Payer: Self-pay | Admitting: *Deleted

## 2011-12-03 ENCOUNTER — Telehealth: Payer: Self-pay | Admitting: Oncology

## 2011-12-03 ENCOUNTER — Other Ambulatory Visit (HOSPITAL_BASED_OUTPATIENT_CLINIC_OR_DEPARTMENT_OTHER): Payer: Medicare Other | Admitting: Lab

## 2011-12-03 DIAGNOSIS — D469 Myelodysplastic syndrome, unspecified: Secondary | ICD-10-CM

## 2011-12-03 LAB — CBC WITH DIFFERENTIAL/PLATELET
Basophils Absolute: 0 10*3/uL (ref 0.0–0.1)
Eosinophils Absolute: 0 10*3/uL (ref 0.0–0.5)
HCT: 25.9 % — ABNORMAL LOW (ref 34.8–46.6)
HGB: 8.7 g/dL — ABNORMAL LOW (ref 11.6–15.9)
LYMPH%: 41 % (ref 14.0–49.7)
MONO#: 0.1 10*3/uL (ref 0.1–0.9)
NEUT#: 0.4 10*3/uL — CL (ref 1.5–6.5)
NEUT%: 51.8 % (ref 38.4–76.8)
Platelets: 14 10*3/uL — ABNORMAL LOW (ref 145–400)
WBC: 0.8 10*3/uL — CL (ref 3.9–10.3)
nRBC: 0 % (ref 0–0)

## 2011-12-03 NOTE — Telephone Encounter (Signed)
gv and printed pt appt schedule for NOV adding a lab for 11.14.13..the patient aware

## 2011-12-04 ENCOUNTER — Ambulatory Visit: Payer: Medicare Other

## 2011-12-05 ENCOUNTER — Ambulatory Visit: Payer: Medicare Other

## 2011-12-05 ENCOUNTER — Other Ambulatory Visit (HOSPITAL_BASED_OUTPATIENT_CLINIC_OR_DEPARTMENT_OTHER): Payer: Medicare Other | Admitting: Lab

## 2011-12-05 DIAGNOSIS — D469 Myelodysplastic syndrome, unspecified: Secondary | ICD-10-CM

## 2011-12-05 LAB — CBC WITH DIFFERENTIAL/PLATELET
Basophils Absolute: 0 10*3/uL (ref 0.0–0.1)
EOS%: 1 % (ref 0.0–7.0)
HCT: 25.3 % — ABNORMAL LOW (ref 34.8–46.6)
HGB: 8.4 g/dL — ABNORMAL LOW (ref 11.6–15.9)
MCH: 29.1 pg (ref 25.1–34.0)
MCV: 87.5 fL (ref 79.5–101.0)
NEUT%: 55.3 % (ref 38.4–76.8)
lymph#: 0.3 10*3/uL — ABNORMAL LOW (ref 0.9–3.3)

## 2011-12-06 ENCOUNTER — Ambulatory Visit: Payer: Medicare Other

## 2011-12-07 ENCOUNTER — Ambulatory Visit: Payer: Medicare Other

## 2011-12-09 ENCOUNTER — Other Ambulatory Visit (HOSPITAL_BASED_OUTPATIENT_CLINIC_OR_DEPARTMENT_OTHER): Payer: Medicare Other | Admitting: Lab

## 2011-12-09 ENCOUNTER — Ambulatory Visit: Payer: Medicare Other | Admitting: Lab

## 2011-12-09 ENCOUNTER — Other Ambulatory Visit: Payer: Self-pay | Admitting: *Deleted

## 2011-12-09 ENCOUNTER — Other Ambulatory Visit: Payer: Self-pay | Admitting: Emergency Medicine

## 2011-12-09 ENCOUNTER — Ambulatory Visit: Payer: Medicare Other

## 2011-12-09 DIAGNOSIS — D649 Anemia, unspecified: Secondary | ICD-10-CM

## 2011-12-09 DIAGNOSIS — D469 Myelodysplastic syndrome, unspecified: Secondary | ICD-10-CM

## 2011-12-09 LAB — CBC WITH DIFFERENTIAL/PLATELET
Basophils Absolute: 0 10*3/uL (ref 0.0–0.1)
EOS%: 1.5 % (ref 0.0–7.0)
HGB: 7.3 g/dL — ABNORMAL LOW (ref 11.6–15.9)
LYMPH%: 34.8 % (ref 14.0–49.7)
MCH: 28.2 pg (ref 25.1–34.0)
MCV: 86.9 fL (ref 79.5–101.0)
MONO%: 8.1 % (ref 0.0–14.0)
NEUT%: 55.6 % (ref 38.4–76.8)
RDW: 12.1 % (ref 11.2–14.5)

## 2011-12-10 ENCOUNTER — Ambulatory Visit (HOSPITAL_BASED_OUTPATIENT_CLINIC_OR_DEPARTMENT_OTHER): Payer: Medicare Other

## 2011-12-10 ENCOUNTER — Ambulatory Visit: Payer: Medicare Other

## 2011-12-10 VITALS — BP 118/59 | HR 80 | Temp 98.2°F | Resp 18

## 2011-12-10 DIAGNOSIS — D649 Anemia, unspecified: Secondary | ICD-10-CM

## 2011-12-10 MED ORDER — SODIUM CHLORIDE 0.9 % IV SOLN: 250.0000 mL | Freq: Once | INTRAVENOUS | Status: DC

## 2011-12-10 NOTE — Patient Instructions (Signed)
Blood Transfusion   A blood transfusion replaces your blood or some of its parts. Blood is replaced when you have lost blood because of surgery, an accident, or for severe blood conditions like anemia.  You can donate blood to be used on yourself if you have a planned surgery. If you lose blood during that surgery, your own blood can be given back to you.  Any blood given to you is checked to make sure it matches your blood type. Your temperature, blood pressure, and heart rate (vital signs) will be checked often.   GET HELP RIGHT AWAY IF:    You feel sick to your stomach (nauseous) or throw up (vomit).   You have watery poop (diarrhea).   You have shortness of breath or trouble breathing.   You have blood in your pee (urine) or have dark colored pee.   You have chest pain or tightness.   Your eyes or skin turn yellow (jaundice).   You have a temperature by mouth above 102 F (38.9 C), not controlled by medicine.   You start to shake and have chills.   You develop a a red rash (hives) or feel itchy.   You develop lightheadedness or feel confused.   You develop back, joint, or muscle pain.   You do not feel hungry (lost appetite).   You feel tired, restless, or nervous.   You develop belly (abdominal) cramps.  Document Released: 04/05/2008 Document Revised: 04/01/2011 Document Reviewed: 04/05/2008  [employer] Patient Information 2013 [employer].

## 2011-12-10 NOTE — Progress Notes (Signed)
Vitals taken at 1230

## 2011-12-11 ENCOUNTER — Ambulatory Visit: Payer: Medicare Other

## 2011-12-11 LAB — TYPE AND SCREEN
ABO/RH(D): O POS
Unit division: 0

## 2011-12-12 ENCOUNTER — Ambulatory Visit: Payer: Medicare Other

## 2011-12-13 ENCOUNTER — Ambulatory Visit: Payer: Medicare Other

## 2011-12-14 ENCOUNTER — Ambulatory Visit: Payer: Medicare Other

## 2011-12-16 ENCOUNTER — Telehealth: Payer: Self-pay | Admitting: Oncology

## 2011-12-16 ENCOUNTER — Ambulatory Visit (HOSPITAL_BASED_OUTPATIENT_CLINIC_OR_DEPARTMENT_OTHER): Payer: Medicare Other | Admitting: Oncology

## 2011-12-16 ENCOUNTER — Other Ambulatory Visit (HOSPITAL_BASED_OUTPATIENT_CLINIC_OR_DEPARTMENT_OTHER): Payer: Medicare Other | Admitting: Lab

## 2011-12-16 ENCOUNTER — Telehealth: Payer: Self-pay | Admitting: *Deleted

## 2011-12-16 VITALS — BP 160/83 | HR 84 | Temp 99.0°F | Resp 18 | Ht 65.0 in | Wt 150.6 lb

## 2011-12-16 DIAGNOSIS — C9110 Chronic lymphocytic leukemia of B-cell type not having achieved remission: Secondary | ICD-10-CM

## 2011-12-16 DIAGNOSIS — D469 Myelodysplastic syndrome, unspecified: Secondary | ICD-10-CM

## 2011-12-16 DIAGNOSIS — D0590 Unspecified type of carcinoma in situ of unspecified breast: Secondary | ICD-10-CM

## 2011-12-16 DIAGNOSIS — D539 Nutritional anemia, unspecified: Secondary | ICD-10-CM

## 2011-12-16 LAB — CBC WITH DIFFERENTIAL/PLATELET
BASO%: 0.4 % (ref 0.0–2.0)
Basophils Absolute: 0 10*3/uL (ref 0.0–0.1)
HCT: 29.2 % — ABNORMAL LOW (ref 34.8–46.6)
HGB: 9.9 g/dL — ABNORMAL LOW (ref 11.6–15.9)
LYMPH%: 26.2 % (ref 14.0–49.7)
MCHC: 34 g/dL (ref 31.5–36.0)
MONO#: 0.1 10*3/uL (ref 0.1–0.9)
NEUT%: 64.1 % (ref 38.4–76.8)
Platelets: 26 10*3/uL — ABNORMAL LOW (ref 145–400)
WBC: 1.8 10*3/uL — ABNORMAL LOW (ref 3.9–10.3)
lymph#: 0.5 10*3/uL — ABNORMAL LOW (ref 0.9–3.3)

## 2011-12-16 NOTE — Telephone Encounter (Signed)
Called pt and she is aware that 12/3 has been changed to 10:00 per her req     anne

## 2011-12-16 NOTE — Progress Notes (Signed)
   Anne Doyle    OFFICE PROGRESS NOTE   INTERVAL HISTORY:   She returns as scheduled. She was last transfused with packed red blood cells on 12/10/2011. Anne Doyle noted improved energy after the red cell transfusion. No fever or bleeding. No complaint.  Objective:  Vital signs in last 24 hours:  Blood pressure 160/83, pulse 84, temperature 99 F (37.2 C), temperature source Oral, resp. rate 18, height 5\' 5"  (1.651 m), weight 150 lb 9.6 oz (68.312 kg).    HEENT: No thrush or ulcers Lymphatics: No cervical, supraclavicular, axillary, or inguinal nodes Resp: Lungs clear bilaterally Cardio: Regular rate and rhythm GI: No hepatosplenomegaly Vascular: No leg edema   Lab Results:  Lab Results  Component Value Date   WBC 1.8* 12/16/2011   HGB 9.9* 12/16/2011   HCT 29.2* 12/16/2011   MCV 87.4 12/16/2011   PLT 26* 12/16/2011   ANC 1.1    Medications: I have reviewed the patient's current medications.  Assessment/Plan: 1. Chronic lymphocytic leukemia, status post two cycles of fludarabine/rituximab with cycle #2 beginning on 05/08/2009. 2. Pancytopenia, status post a follow-up bone marrow biopsy, 09/01/2009, with involvement by CLL identified.  3. History of a prolonged bleeding time. A diagnostic evaluation including a von Willebrand panel and platelet function studies were negative. 4. Left breast ductal carcinoma in situ, status post a needle localized lumpectomy, 07/19/2008, followed by left breast radiation. She continues tamoxifen. 5. Chills during the Rituxan infusion, 04/05/2009. She was premedicated with Decadron at home prior to cycle #2. She tolerated cycle #2 without significant acute toxicity aside from upper chest and facial erythema. 6. Marked neutropenia on labs, 11/22 and 12/19/2009, likely a delayed nadir related to rituximab. Resolved. 7. Macrocytic anemia-progressive. A laboratory evaluation on 01/14/2011 did not suggest hemolysis. A DAT  was negative and the reticulocyte count was low on 01/28/2011. Bone marrow aspiration/biopsy 02/07/2011 showed a hypercellular marrow for age with dyspoietic changes primarily involving the erythroid and megakaryocytic cell lines. There was no morphologic or immunophenotypic evidence of a B-cell lymphoproliferative process. Cytogenetics returned normal. She began a trial of weekly Procrit on 02/25/2011. She continues intermittent red cell transfusion support. Cycle 1 of 5 azacytidine started on 06/18/11. Cycle 2 of 5-azacytidine at a reduced dose beginning 08/05/2011. She received Neulasta support with cycle 2. Cycle 3 of 5-azacytidine beginning 09/09/2011. She again receive Neulasta support. Procrit was discontinued following an office visit on 10/07/2011 due to no apparent benefit. Cycle 4 of 5-azacytidine on 10/28/2011. 8. Neutropenia/progressive thrombocytopenia following 5-azacytidine. The neutropenia and thrombocytopenia have improved 9. Cough and sore throat with abnormal lung exam on 08/19/2011. Negative chest x-ray. She completed a course of azithromycin. Symptoms resolved.  Disposition:  She appears stable. Anne Doyle continues to receive intermittent red cell transfusion support. She will complete another cycle of 5-azacytidine  beginning on 12/23/2011. She will receive Neulasta support. Anne Doyle will return for a weekly CBC and transfusion support as needed. She is scheduled for an office visit on 01/07/2012.  The plan is to proceed with a diagnostic bone marrow biopsy after the next cycle of chemotherapy.   Thornton Papas, MD  12/16/2011  1:06 PM

## 2011-12-16 NOTE — Telephone Encounter (Signed)
appts made and printed for pt aom °

## 2011-12-16 NOTE — Telephone Encounter (Signed)
Per staff message I have moved 12/3 apapt to later.  JMW

## 2011-12-21 ENCOUNTER — Other Ambulatory Visit: Payer: Self-pay | Admitting: Oncology

## 2011-12-23 ENCOUNTER — Ambulatory Visit: Payer: Medicare Other

## 2011-12-23 ENCOUNTER — Telehealth: Payer: Self-pay | Admitting: *Deleted

## 2011-12-23 ENCOUNTER — Other Ambulatory Visit (HOSPITAL_BASED_OUTPATIENT_CLINIC_OR_DEPARTMENT_OTHER): Payer: Medicare Other | Admitting: Lab

## 2011-12-23 ENCOUNTER — Other Ambulatory Visit: Payer: Self-pay | Admitting: *Deleted

## 2011-12-23 DIAGNOSIS — D469 Myelodysplastic syndrome, unspecified: Secondary | ICD-10-CM

## 2011-12-23 LAB — CBC WITH DIFFERENTIAL/PLATELET
BASO%: 0 % (ref 0.0–2.0)
EOS%: 1.5 % (ref 0.0–7.0)
MCH: 28.7 pg (ref 25.1–34.0)
MCHC: 33.2 g/dL (ref 31.5–36.0)
RBC: 2.86 10*6/uL — ABNORMAL LOW (ref 3.70–5.45)
RDW: 12.3 % (ref 11.2–14.5)
WBC: 1.3 10*3/uL — ABNORMAL LOW (ref 3.9–10.3)
lymph#: 0.3 10*3/uL — ABNORMAL LOW (ref 0.9–3.3)

## 2011-12-23 LAB — HOLD TUBE, BLOOD BANK

## 2011-12-23 NOTE — Telephone Encounter (Signed)
Per staff phone call and POF I have scheduled appts. I have called and gave the husband appts for Monday. They will pick up schedule then.   JMW

## 2011-12-23 NOTE — Progress Notes (Signed)
1610 Per Dr. Truett Perna, today's treatment to be deferred one week d/t low lab counts.

## 2011-12-24 ENCOUNTER — Ambulatory Visit: Payer: Medicare Other

## 2011-12-25 ENCOUNTER — Ambulatory Visit: Payer: Medicare Other

## 2011-12-26 ENCOUNTER — Ambulatory Visit: Payer: Medicare Other

## 2011-12-27 ENCOUNTER — Ambulatory Visit: Payer: Medicare Other

## 2011-12-27 ENCOUNTER — Encounter: Payer: Self-pay | Admitting: Pharmacist

## 2011-12-28 ENCOUNTER — Ambulatory Visit: Payer: Medicare Other

## 2011-12-30 ENCOUNTER — Encounter (HOSPITAL_COMMUNITY)
Admission: RE | Admit: 2011-12-30 | Discharge: 2011-12-30 | Disposition: A | Discharge status: Home / Self Care | Payer: Medicare Other | Source: Ambulatory Visit | Attending: Oncology | Admitting: Oncology

## 2011-12-30 ENCOUNTER — Ambulatory Visit: Payer: Medicare Other

## 2011-12-30 ENCOUNTER — Other Ambulatory Visit: Payer: Self-pay | Admitting: *Deleted

## 2011-12-30 ENCOUNTER — Other Ambulatory Visit (HOSPITAL_BASED_OUTPATIENT_CLINIC_OR_DEPARTMENT_OTHER): Payer: Medicare Other | Admitting: Lab

## 2011-12-30 VITALS — BP 108/61 | HR 79 | Temp 98.5°F | Resp 20

## 2011-12-30 DIAGNOSIS — D649 Anemia, unspecified: Secondary | ICD-10-CM

## 2011-12-30 DIAGNOSIS — D469 Myelodysplastic syndrome, unspecified: Secondary | ICD-10-CM

## 2011-12-30 LAB — CBC WITH DIFFERENTIAL/PLATELET
Eosinophils Absolute: 0 10*3/uL (ref 0.0–0.5)
MCV: 86.1 fL (ref 79.5–101.0)
MONO#: 0.1 10*3/uL (ref 0.1–0.9)
MONO%: 6.4 % (ref 0.0–14.0)
NEUT#: 1.1 10*3/uL — ABNORMAL LOW (ref 1.5–6.5)
RBC: 2.41 10*6/uL — ABNORMAL LOW (ref 3.70–5.45)
RDW: 12.3 % (ref 11.2–14.5)
WBC: 1.7 10*3/uL — ABNORMAL LOW (ref 3.9–10.3)
lymph#: 0.5 10*3/uL — ABNORMAL LOW (ref 0.9–3.3)

## 2011-12-30 LAB — HOLD TUBE, BLOOD BANK

## 2011-12-30 MED ORDER — SODIUM CHLORIDE 0.9 % IV SOLN
250.0000 mL | Freq: Once | INTRAVENOUS | Status: AC
  Administered 2011-12-30: 250 mL via INTRAVENOUS

## 2011-12-30 NOTE — Progress Notes (Signed)
Treatment held today (Vidaza); patient receiving 1 unit PRBCs only today.

## 2011-12-31 ENCOUNTER — Telehealth: Payer: Self-pay | Admitting: Oncology

## 2011-12-31 ENCOUNTER — Ambulatory Visit (HOSPITAL_BASED_OUTPATIENT_CLINIC_OR_DEPARTMENT_OTHER): Payer: Medicare Other

## 2011-12-31 VITALS — BP 117/69 | HR 79 | Temp 97.4°F | Resp 18

## 2011-12-31 DIAGNOSIS — D649 Anemia, unspecified: Secondary | ICD-10-CM

## 2011-12-31 NOTE — Telephone Encounter (Signed)
Pt came in to check on lab and md sch for 12/17 as she is worried that if she were able to start her vidaza which is a 5day tx then this wouldn't happen.  Per Misty Stanley T  we can just ck her lab only on 12/16 so if her counts are ok then maybe we can ger worked in for a trtment as she nor dr gbs have anything avil on 12/16 to see her.  Pt is aware and a new sch was printed

## 2011-12-31 NOTE — Patient Instructions (Addendum)
Blood Transfusion Information WHAT IS A BLOOD TRANSFUSION? A transfusion is the replacement of blood or some of its parts. Blood is made up of multiple cells which provide different functions.  Red blood cells carry oxygen and are used for blood loss replacement.  White blood cells fight against infection.  Platelets control bleeding.  Plasma helps clot blood.  Other blood products are available for specialized needs, such as hemophilia or other clotting disorders. BEFORE THE TRANSFUSION  Who gives blood for transfusions?   You may be able to donate blood to be used at a later date on yourself (autologous donation).  Relatives can be asked to donate blood. This is generally not any safer than if you have received blood from a stranger. The same precautions are taken to ensure safety when a relative's blood is donated.  Healthy volunteers who are fully evaluated to make sure their blood is safe. This is blood bank blood. Transfusion therapy is the safest it has ever been in the practice of medicine. Before blood is taken from a donor, a complete history is taken to make sure that person has no history of diseases nor engages in risky social behavior (examples are intravenous drug use or sexual activity with multiple partners). The donor's travel history is screened to minimize risk of transmitting infections, such as malaria. The donated blood is tested for signs of infectious diseases, such as HIV and hepatitis. The blood is then tested to be sure it is compatible with you in order to minimize the chance of a transfusion reaction. If you or a relative donates blood, this is often done in anticipation of surgery and is not appropriate for emergency situations. It takes many days to process the donated blood. RISKS AND COMPLICATIONS Although transfusion therapy is very safe and saves many lives, the main dangers of transfusion include:   Getting an infectious disease.  Developing a  transfusion reaction. This is an allergic reaction to something in the blood you were given. Every precaution is taken to prevent this. The decision to have a blood transfusion has been considered carefully by your caregiver before blood is given. Blood is not given unless the benefits outweigh the risks. AFTER THE TRANSFUSION  Right after receiving a blood transfusion, you will usually feel much better and more energetic. This is especially true if your red blood cells have gotten low (anemic). The transfusion raises the level of the red blood cells which carry oxygen, and this usually causes an energy increase.  The nurse administering the transfusion will monitor you carefully for complications. HOME CARE INSTRUCTIONS  No special instructions are needed after a transfusion. You may find your energy is better. Speak with your caregiver about any limitations on activity for underlying diseases you may have. SEEK MEDICAL CARE IF:   Your condition is not improving after your transfusion.  You develop redness or irritation at the intravenous (IV) site. SEEK IMMEDIATE MEDICAL CARE IF:  Any of the following symptoms occur over the next 12 hours:  Shaking chills.  You have a temperature by mouth above 102 F (38.9 C), not controlled by medicine.  Chest, back, or muscle pain.  People around you feel you are not acting correctly or are confused.  Shortness of breath or difficulty breathing.  Dizziness and fainting.  You get a rash or develop hives.  You have a decrease in urine output.  Your urine turns a dark color or changes to pink, red, or brown. Any of the following   symptoms occur over the next 10 days:  You have a temperature by mouth above 102 F (38.9 C), not controlled by medicine.  Shortness of breath.  Weakness after normal activity.  The white part of the eye turns yellow (jaundice).  You have a decrease in the amount of urine or are urinating less often.  Your  urine turns a dark color or changes to pink, red, or brown. Document Released: 01/05/2000 Document Revised: 04/01/2011 Document Reviewed: 08/24/2007 Exit[employer] Patient Information 2013 [employer].  

## 2012-01-01 ENCOUNTER — Other Ambulatory Visit: Payer: Self-pay | Admitting: *Deleted

## 2012-01-01 ENCOUNTER — Ambulatory Visit: Payer: Medicare Other

## 2012-01-01 LAB — TYPE AND SCREEN
ABO/RH(D): O POS
Antibody Screen: NEGATIVE
Unit division: 0

## 2012-01-02 ENCOUNTER — Ambulatory Visit: Payer: Medicare Other

## 2012-01-03 ENCOUNTER — Telehealth: Payer: Self-pay | Admitting: *Deleted

## 2012-01-03 ENCOUNTER — Ambulatory Visit: Payer: Medicare Other

## 2012-01-03 ENCOUNTER — Telehealth: Payer: Self-pay | Admitting: Oncology

## 2012-01-03 NOTE — Telephone Encounter (Signed)
Per staff message and POF I have scheduled appts.  JMW  

## 2012-01-03 NOTE — Telephone Encounter (Signed)
Talked to patient, gave her appt for 01/06/12 lab, chemo and advised patient to get new calendar

## 2012-01-04 ENCOUNTER — Ambulatory Visit: Payer: Medicare Other

## 2012-01-06 ENCOUNTER — Ambulatory Visit: Payer: Medicare Other

## 2012-01-06 ENCOUNTER — Other Ambulatory Visit: Payer: Medicare Other

## 2012-01-06 DIAGNOSIS — D696 Thrombocytopenia, unspecified: Secondary | ICD-10-CM

## 2012-01-06 DIAGNOSIS — D469 Myelodysplastic syndrome, unspecified: Secondary | ICD-10-CM

## 2012-01-06 LAB — CBC WITH DIFFERENTIAL/PLATELET
Basophils Absolute: 0 10*3/uL (ref 0.0–0.1)
Eosinophils Absolute: 0 10*3/uL (ref 0.0–0.5)
HCT: 27.4 % — ABNORMAL LOW (ref 34.8–46.6)
HGB: 9.4 g/dL — ABNORMAL LOW (ref 11.6–15.9)
LYMPH%: 24.1 % (ref 14.0–49.7)
MCV: 87.3 fL (ref 79.5–101.0)
MONO%: 5.2 % (ref 0.0–14.0)
NEUT#: 0.9 10*3/uL — ABNORMAL LOW (ref 1.5–6.5)
NEUT%: 69.3 % (ref 38.4–76.8)
Platelets: 16 10*3/uL — ABNORMAL LOW (ref 145–400)

## 2012-01-06 NOTE — Progress Notes (Signed)
Per Dr. Truett Perna, treatment [facility]) being held today d/t lab results.

## 2012-01-07 ENCOUNTER — Ambulatory Visit: Payer: Medicare Other

## 2012-01-07 ENCOUNTER — Other Ambulatory Visit: Payer: Medicare Other | Admitting: Lab

## 2012-01-07 ENCOUNTER — Telehealth: Payer: Self-pay | Admitting: Oncology

## 2012-01-07 ENCOUNTER — Ambulatory Visit (HOSPITAL_BASED_OUTPATIENT_CLINIC_OR_DEPARTMENT_OTHER): Payer: Medicare Other | Admitting: Nurse Practitioner

## 2012-01-07 ENCOUNTER — Telehealth: Payer: Self-pay | Admitting: *Deleted

## 2012-01-07 VITALS — BP 150/77 | HR 89 | Temp 97.8°F | Resp 18 | Ht 65.0 in | Wt 148.8 lb

## 2012-01-07 DIAGNOSIS — D649 Anemia, unspecified: Secondary | ICD-10-CM

## 2012-01-07 DIAGNOSIS — C9110 Chronic lymphocytic leukemia of B-cell type not having achieved remission: Secondary | ICD-10-CM

## 2012-01-07 DIAGNOSIS — D469 Myelodysplastic syndrome, unspecified: Secondary | ICD-10-CM

## 2012-01-07 DIAGNOSIS — D696 Thrombocytopenia, unspecified: Secondary | ICD-10-CM

## 2012-01-07 NOTE — Telephone Encounter (Signed)
Open by [facility]

## 2012-01-07 NOTE — Telephone Encounter (Signed)
gv and printed appt schedule for pt for Dec and Jan 2014 ° °

## 2012-01-07 NOTE — Progress Notes (Signed)
OFFICE PROGRESS NOTE  Interval history:  Anne Doyle returns as scheduled. She was last transfused with packed red blood cells on 12/31/2011 when the hemoglobin returned at 7.0. She felt better after the blood transfusion.   She completed the most recent cycle of 5-azacytidine beginning 10/28/2011. The next cycle has been on hold due to pancytopenia. She denies bleeding. No fever.   Objective: Blood pressure 150/77, pulse 89, temperature 97.8 F (36.6 C), temperature source Oral, resp. rate 18, height 5\' 5"  (1.651 m), weight 148 lb 12.8 oz (67.495 kg).  Oropharynx is without thrush or ulceration. No palpable cervical or supraclavicular lymph nodes. Lungs are clear. Regular cardiac rhythm. Abdomen soft and nontender. No organomegaly. Extremities are without edema.  Lab Results: Lab Results  Component Value Date   WBC 1.2* 01/06/2012   HGB 9.4* 01/06/2012   HCT 27.4* 01/06/2012   MCV 87.3 01/06/2012   PLT 16* 01/06/2012    Chemistry:    Chemistry      Component Value Date/Time   NA 141 12/02/2011 1321   NA 141 09/02/2011 0928   K 4.0 12/02/2011 1321   K 4.0 09/02/2011 0928   CL 105 12/02/2011 1321   CL 105 09/02/2011 0928   CO2 30* 12/02/2011 1321   CO2 28 09/02/2011 0928   BUN 18.0 12/02/2011 1321   BUN 15 09/02/2011 0928   CREATININE 0.8 12/02/2011 1321   CREATININE 0.71 09/02/2011 0928      Component Value Date/Time   CALCIUM 9.8 12/02/2011 1321   CALCIUM 8.7 09/02/2011 0928   ALKPHOS 82 12/02/2011 1321   ALKPHOS 73 09/02/2011 0928   AST 25 12/02/2011 1321   AST 13 09/02/2011 0928   ALT 51 12/02/2011 1321   ALT 18 09/02/2011 0928   BILITOT 0.73 12/02/2011 1321   BILITOT 0.5 09/02/2011 0928       Studies/Results: No results found.  Medications: I have reviewed the patient's current medications.  Assessment/Plan:  1. Chronic lymphocytic leukemia, status post two cycles of fludarabine/rituximab with cycle #2 beginning on 05/08/2009. 2. Pancytopenia, status post a  follow-up bone marrow biopsy, 09/01/2009, with involvement by CLL identified.  3. History of a prolonged bleeding time. A diagnostic evaluation including a von Willebrand panel and platelet function studies were negative. 4. Left breast ductal carcinoma in situ, status post a needle localized lumpectomy, 07/19/2008, followed by left breast radiation. She continues tamoxifen. 5. Chills during the Rituxan infusion, 04/05/2009. She was premedicated with Decadron at home prior to cycle #2. She tolerated cycle #2 without significant acute toxicity aside from upper chest and facial erythema. 6. Marked neutropenia on labs, 11/22 and 12/19/2009, likely a delayed nadir related to rituximab. Resolved. 7. Macrocytic anemia-progressive. A laboratory evaluation on 01/14/2011 did not suggest hemolysis. A DAT was negative and the reticulocyte count was low on 01/28/2011. Bone marrow aspiration/biopsy 02/07/2011 showed a hypercellular marrow for age with dyspoietic changes primarily involving the erythroid and megakaryocytic cell lines. There was no morphologic or immunophenotypic evidence of a B-cell lymphoproliferative process. Cytogenetics returned normal. She began a trial of weekly Procrit on 02/25/2011. She continues intermittent red cell transfusion support. Cycle 1 of 5 azacytidine started on 06/18/11. Cycle 2 of 5-azacytidine at a reduced dose beginning 08/05/2011. She received Neulasta support with cycle 2. Cycle 3 of 5-azacytidine beginning 09/09/2011. She again receive Neulasta support. Procrit was discontinued following an office visit on 10/07/2011 due to no apparent benefit. Cycle 4 of 5-azacytidine on 10/28/2011. 8. Neutropenia/progressive thrombocytopenia following 5-azacytidine.  9.  Cough and sore throat with abnormal lung exam on 08/19/2011. Negative chest x-ray. She completed a course of azithromycin. Symptoms resolved.  Disposition-[name] has completed 4 cycles of 5-azacytidine with cycle 4  initiated on 10/28/2011. She continues to require intermittent red cell transfusion support and have significant thrombocytopenia. The next cycle of 5-azacytidine has been on hold due to the low platelet count.   Dr. Truett Perna is concerned that the persistent severe thrombocytopenia is related to the disease process rather than the 5-azacytidine and recommends proceeding with a bone marrow biopsy at this time. [name] feel strongly that she needs to complete another cycle of the 5-azacytidine prior to a followup bone marrow biopsy. She would like to continue weekly CBCs with transfusion support as needed for now.  She will return for a followup visit on 01/27/2012. She will contact the office in the interim with any problems. We specifically discussed spontaneous bruising/bleeding.  Plan reviewed with Dr. Truett Perna.  Anne Doyle ANP/GNP-BC

## 2012-01-08 ENCOUNTER — Ambulatory Visit: Payer: Medicare Other

## 2012-01-09 ENCOUNTER — Ambulatory Visit: Payer: Medicare Other

## 2012-01-10 ENCOUNTER — Ambulatory Visit: Payer: Medicare Other

## 2012-01-11 ENCOUNTER — Ambulatory Visit: Payer: Medicare Other

## 2012-01-13 ENCOUNTER — Other Ambulatory Visit (HOSPITAL_BASED_OUTPATIENT_CLINIC_OR_DEPARTMENT_OTHER): Payer: Medicare Other | Admitting: Lab

## 2012-01-13 ENCOUNTER — Telehealth: Payer: Self-pay | Admitting: *Deleted

## 2012-01-13 ENCOUNTER — Other Ambulatory Visit: Payer: Self-pay | Admitting: *Deleted

## 2012-01-13 DIAGNOSIS — C9110 Chronic lymphocytic leukemia of B-cell type not having achieved remission: Secondary | ICD-10-CM

## 2012-01-13 DIAGNOSIS — D469 Myelodysplastic syndrome, unspecified: Secondary | ICD-10-CM

## 2012-01-13 LAB — CBC WITH DIFFERENTIAL/PLATELET
BASO%: 0 % (ref 0.0–2.0)
HCT: 23.9 % — ABNORMAL LOW (ref 34.8–46.6)
LYMPH%: 32.3 % (ref 14.0–49.7)
MCH: 28.6 pg (ref 25.1–34.0)
MCHC: 33.5 g/dL (ref 31.5–36.0)
MONO#: 0 10*3/uL — ABNORMAL LOW (ref 0.1–0.9)
NEUT%: 63.7 % (ref 38.4–76.8)
Platelets: 14 10*3/uL — ABNORMAL LOW (ref 145–400)
WBC: 1 10*3/uL — ABNORMAL LOW (ref 3.9–10.3)

## 2012-01-13 NOTE — Telephone Encounter (Signed)
Left VM with CBC results and requested she call and let nurse know if she wishes to proceed with transfusion this week or wait to check counts again next week.

## 2012-01-13 NOTE — Telephone Encounter (Signed)
Reviewed results. She prefers to wait until Monday for lab and transfusion. OK per Dr. Truett Perna. He strongly encourages her to have the bone marrow biopsy soon. Patient made aware of this.

## 2012-01-14 ENCOUNTER — Telehealth: Payer: Self-pay | Admitting: Oncology

## 2012-01-14 NOTE — Telephone Encounter (Signed)
Called RN notified of no availability on 01/20/12 for blood tx

## 2012-01-16 ENCOUNTER — Telehealth: Payer: Self-pay | Admitting: Oncology

## 2012-01-16 ENCOUNTER — Other Ambulatory Visit: Payer: Self-pay | Admitting: *Deleted

## 2012-01-16 ENCOUNTER — Telehealth: Payer: Self-pay | Admitting: *Deleted

## 2012-01-16 DIAGNOSIS — D649 Anemia, unspecified: Secondary | ICD-10-CM

## 2012-01-16 NOTE — Telephone Encounter (Signed)
Per staff messages I have scheduled patient for blood transfusion on Saturday. The patient contacted and given appt. JMW

## 2012-01-16 NOTE — Telephone Encounter (Signed)
Called Dr. Kalman Drape nurse left message regarding 12/30 tx for PRBC, no room, waiting for answer

## 2012-01-16 NOTE — Progress Notes (Signed)
Spoke with patient and she agrees to [facility] on Friday and transfusion on Saturday. Will have scheduler call her with time on Saturday.

## 2012-01-16 NOTE — Telephone Encounter (Signed)
Called pt and left message regarding lab on 12/27 and 12/28 blood transfusion

## 2012-01-16 NOTE — Telephone Encounter (Signed)
Talked to patient's husband, he is aware of appt on 01/27/12 0830am lab with md visit

## 2012-01-17 ENCOUNTER — Other Ambulatory Visit (HOSPITAL_BASED_OUTPATIENT_CLINIC_OR_DEPARTMENT_OTHER): Payer: Medicare Other | Admitting: Lab

## 2012-01-17 DIAGNOSIS — D649 Anemia, unspecified: Secondary | ICD-10-CM

## 2012-01-17 LAB — CBC WITH DIFFERENTIAL/PLATELET
Basophils Absolute: 0 10*3/uL (ref 0.0–0.1)
Eosinophils Absolute: 0 10*3/uL (ref 0.0–0.5)
LYMPH%: 34.3 % (ref 14.0–49.7)
MCV: 84.7 fL (ref 79.5–101.0)
MONO%: 7.4 % (ref 0.0–14.0)
NEUT#: 0.6 10*3/uL — ABNORMAL LOW (ref 1.5–6.5)
NEUT%: 58.3 % (ref 38.4–76.8)
Platelets: 14 10*3/uL — ABNORMAL LOW (ref 145–400)
RBC: 2.29 10*6/uL — ABNORMAL LOW (ref 3.70–5.45)
nRBC: 0 % (ref 0–0)

## 2012-01-18 ENCOUNTER — Ambulatory Visit (HOSPITAL_BASED_OUTPATIENT_CLINIC_OR_DEPARTMENT_OTHER): Payer: Medicare Other

## 2012-01-18 VITALS — BP 121/61 | HR 71 | Temp 97.0°F | Resp 18

## 2012-01-18 DIAGNOSIS — D649 Anemia, unspecified: Secondary | ICD-10-CM

## 2012-01-18 MED ORDER — SODIUM CHLORIDE 0.9 % IV SOLN: 250.0000 mL | Freq: Once | INTRAVENOUS | Status: DC

## 2012-01-18 NOTE — Patient Instructions (Signed)
Blood Transfusion Information WHAT IS A BLOOD TRANSFUSION? A transfusion is the replacement of blood or some of its parts. Blood is made up of multiple cells which provide different functions.  Red blood cells carry oxygen and are used for blood loss replacement.  White blood cells fight against infection.  Platelets control bleeding.  Plasma helps clot blood.  Other blood products are available for specialized needs, such as hemophilia or other clotting disorders. BEFORE THE TRANSFUSION  Who gives blood for transfusions?   You may be able to donate blood to be used at a later date on yourself (autologous donation).  Relatives can be asked to donate blood. This is generally not any safer than if you have received blood from a stranger. The same precautions are taken to ensure safety when a relative's blood is donated.  Healthy volunteers who are fully evaluated to make sure their blood is safe. This is blood bank blood. Transfusion therapy is the safest it has ever been in the practice of medicine. Before blood is taken from a donor, a complete history is taken to make sure that person has no history of diseases nor engages in risky social behavior (examples are intravenous drug use or sexual activity with multiple partners). The donor's travel history is screened to minimize risk of transmitting infections, such as malaria. The donated blood is tested for signs of infectious diseases, such as HIV and hepatitis. The blood is then tested to be sure it is compatible with you in order to minimize the chance of a transfusion reaction. If you or a relative donates blood, this is often done in anticipation of surgery and is not appropriate for emergency situations. It takes many days to process the donated blood. RISKS AND COMPLICATIONS Although transfusion therapy is very safe and saves many lives, the main dangers of transfusion include:   Getting an infectious disease.  Developing a  transfusion reaction. This is an allergic reaction to something in the blood you were given. Every precaution is taken to prevent this. The decision to have a blood transfusion has been considered carefully by your caregiver before blood is given. Blood is not given unless the benefits outweigh the risks. AFTER THE TRANSFUSION  Right after receiving a blood transfusion, you will usually feel much better and more energetic. This is especially true if your red blood cells have gotten low (anemic). The transfusion raises the level of the red blood cells which carry oxygen, and this usually causes an energy increase.  The nurse administering the transfusion will monitor you carefully for complications. HOME CARE INSTRUCTIONS  No special instructions are needed after a transfusion. You may find your energy is better. Speak with your caregiver about any limitations on activity for underlying diseases you may have. SEEK MEDICAL CARE IF:   Your condition is not improving after your transfusion.  You develop redness or irritation at the intravenous (IV) site. SEEK IMMEDIATE MEDICAL CARE IF:  Any of the following symptoms occur over the next 12 hours:  Shaking chills.  You have a temperature by mouth above 102 F (38.9 C), not controlled by medicine.  Chest, back, or muscle pain.  People around you feel you are not acting correctly or are confused.  Shortness of breath or difficulty breathing.  Dizziness and fainting.  You get a rash or develop hives.  You have a decrease in urine output.  Your urine turns a dark color or changes to pink, red, or brown. Any of the following   symptoms occur over the next 10 days:  You have a temperature by mouth above 102 F (38.9 C), not controlled by medicine.  Shortness of breath.  Weakness after normal activity.  The white part of the eye turns yellow (jaundice).  You have a decrease in the amount of urine or are urinating less often.  Your  urine turns a dark color or changes to pink, red, or brown. Document Released: 01/05/2000 Document Revised: 04/01/2011 Document Reviewed: 08/24/2007 ExitCare Patient Information 2013 [employer].  

## 2012-01-19 LAB — TYPE AND SCREEN: Unit division: 0

## 2012-01-20 ENCOUNTER — Other Ambulatory Visit: Payer: Medicare Other | Admitting: Lab

## 2012-01-27 ENCOUNTER — Other Ambulatory Visit: Payer: Self-pay | Admitting: *Deleted

## 2012-01-27 ENCOUNTER — Encounter (HOSPITAL_COMMUNITY)
Admission: RE | Admit: 2012-01-27 | Discharge: 2012-01-27 | Disposition: A | Discharge status: Home / Self Care | Payer: Medicare Other | Source: Ambulatory Visit | Attending: Oncology | Admitting: Oncology

## 2012-01-27 ENCOUNTER — Telehealth: Payer: Self-pay | Admitting: *Deleted

## 2012-01-27 ENCOUNTER — Other Ambulatory Visit (HOSPITAL_BASED_OUTPATIENT_CLINIC_OR_DEPARTMENT_OTHER): Payer: Medicare Other | Admitting: Lab

## 2012-01-27 ENCOUNTER — Ambulatory Visit: Payer: Medicare Other | Admitting: Oncology

## 2012-01-27 DIAGNOSIS — D649 Anemia, unspecified: Secondary | ICD-10-CM

## 2012-01-27 DIAGNOSIS — D469 Myelodysplastic syndrome, unspecified: Secondary | ICD-10-CM

## 2012-01-27 LAB — CBC WITH DIFFERENTIAL/PLATELET
BASO%: 0.2 % (ref 0.0–2.0)
Eosinophils Absolute: 0 10*3/uL (ref 0.0–0.5)
MONO#: 0.1 10*3/uL (ref 0.1–0.9)
MONO%: 6.1 % (ref 0.0–14.0)
NEUT#: 0.6 10*3/uL — ABNORMAL LOW (ref 1.5–6.5)
RBC: 2.49 10*6/uL — ABNORMAL LOW (ref 3.70–5.45)
RDW: 12.5 % (ref 11.2–14.5)
WBC: 1 10*3/uL — ABNORMAL LOW (ref 3.9–10.3)

## 2012-01-27 LAB — HOLD TUBE, BLOOD BANK

## 2012-01-27 LAB — PREPARE RBC (CROSSMATCH)

## 2012-01-27 NOTE — Telephone Encounter (Signed)
Spoke with patient in lobby regarding counts. She is feeling weak and would like transfusion this week. Denies any bleeding or bruising. Rescheduled her MD visit for 01/30/12 and will be transfused at 0730 that day. She agrees.

## 2012-01-30 ENCOUNTER — Ambulatory Visit (HOSPITAL_BASED_OUTPATIENT_CLINIC_OR_DEPARTMENT_OTHER): Payer: Medicare Other | Admitting: Oncology

## 2012-01-30 ENCOUNTER — Telehealth: Payer: Self-pay | Admitting: Oncology

## 2012-01-30 ENCOUNTER — Ambulatory Visit (HOSPITAL_BASED_OUTPATIENT_CLINIC_OR_DEPARTMENT_OTHER): Payer: Medicare Other

## 2012-01-30 ENCOUNTER — Other Ambulatory Visit: Payer: Medicare Other | Admitting: Lab

## 2012-01-30 VITALS — BP 134/49 | HR 94 | Temp 98.3°F | Resp 20

## 2012-01-30 VITALS — Ht 65.0 in | Wt 150.0 lb

## 2012-01-30 DIAGNOSIS — D649 Anemia, unspecified: Secondary | ICD-10-CM

## 2012-01-30 DIAGNOSIS — D709 Neutropenia, unspecified: Secondary | ICD-10-CM

## 2012-01-30 DIAGNOSIS — D696 Thrombocytopenia, unspecified: Secondary | ICD-10-CM

## 2012-01-30 DIAGNOSIS — D61818 Other pancytopenia: Secondary | ICD-10-CM

## 2012-01-30 DIAGNOSIS — C9110 Chronic lymphocytic leukemia of B-cell type not having achieved remission: Secondary | ICD-10-CM

## 2012-01-30 DIAGNOSIS — D539 Nutritional anemia, unspecified: Secondary | ICD-10-CM

## 2012-01-30 MED ORDER — SODIUM CHLORIDE 0.9 % IV SOLN
250.0000 mL | Freq: Once | INTRAVENOUS | Status: AC
  Administered 2012-01-30: 250 mL via INTRAVENOUS

## 2012-01-30 NOTE — Progress Notes (Signed)
   [facility]    OFFICE PROGRESS NOTE   INTERVAL HISTORY:   She returns as scheduled. She received a red cell transfusion earlier today. No new complaint. No fever or bleeding. She bruises easily.  Objective:  Vital signs in last 24 hours:  Height 5\' 5"  (1.651 m), weight 150 lb (68.04 kg).    HEENT: Small ecchymosis at the right buccal mucosa, no active bleeding. No thrush. Lymphatics: No cervical, supraclavicular, axillary, or inguinal nodes Resp: Lungs clear bilaterally Cardio: Regular rate and rhythm GI: No hepatosplenomegaly Vascular: No leg edema  Skin: Few small ecchymoses at the dorsum of the hand     Lab Results:  Lab Results  Component Value Date   WBC 1.0* 01/27/2012   HGB 7.7* 01/27/2012   HCT 21.9* 01/27/2012   MCV 87.8 01/27/2012   PLT 11* 01/27/2012   ANC 0.6    Medications: I have reviewed the patient's current medications.  Assessment/Plan: 1. Chronic lymphocytic leukemia, status post two cycles of fludarabine/rituximab with cycle #2 beginning on 05/08/2009. 2. Pancytopenia, status post a follow-up bone marrow biopsy, 09/01/2009, with involvement by CLL identified.  3. History of a prolonged bleeding time. A diagnostic evaluation including a von Willebrand panel and platelet function studies were negative. 4. Left breast ductal carcinoma in situ, status post a needle localized lumpectomy, 07/19/2008, followed by left breast radiation. She continues tamoxifen. 5. Chills during the Rituxan infusion, 04/05/2009. She was premedicated with Decadron at home prior to cycle #2. She tolerated cycle #2 without significant acute toxicity aside from upper chest and facial erythema. 6. Marked neutropenia on labs, 11/22 and 12/19/2009, likely a delayed nadir related to rituximab. Resolved. 7. Macrocytic anemia-progressive. A laboratory evaluation on 01/14/2011 did not suggest hemolysis. A DAT was negative and the reticulocyte count was low on 01/28/2011. Bone  marrow aspiration/biopsy 02/07/2011 showed a hypercellular marrow for age with dyspoietic changes primarily involving the erythroid and megakaryocytic cell lines. There was no morphologic or immunophenotypic evidence of a B-cell lymphoproliferative process. Cytogenetics returned normal. She began a trial of weekly Procrit on 02/25/2011. She continues intermittent red cell transfusion support. Cycle 1 of 5 azacytidine started on 06/18/11. Cycle 2 of 5-azacytidine at a reduced dose beginning 08/05/2011. She received Neulasta support with cycle 2. Cycle 3 of 5-azacytidine beginning 09/09/2011. She again receive Neulasta support. Procrit was discontinued following an office visit on 10/07/2011 due to no apparent benefit. Cycle 4 of 5-azacytidine on 10/28/2011. 8. Neutropenia/progressive thrombocytopenia following 5-azacytidine.  9. Cough and sore throat with abnormal lung exam on 08/19/2011. Negative chest x-ray. She completed a course of azithromycin. Symptoms resolved.   Disposition:  She continues to have severe pancytopenia despite 4 cycles of 5-azacytidine We have not been able to deliver additional chemotherapy secondary to persistent neutropenia/thrombocytopenia. Ms. Chriswell agrees to a restaging bone marrow biopsy. This will be scheduled within the next one week. She will return for an office visit after the bone marrow biopsy. I plan to contact Dr. Lowell Guitar to discuss treatment options.   Thornton Papas, MD  01/30/2012  2:52 PM

## 2012-01-30 NOTE — Patient Instructions (Addendum)
Blood Transfusion Information WHAT IS A BLOOD TRANSFUSION? A transfusion is the replacement of blood or some of its parts. Blood is made up of multiple cells which provide different functions.  Red blood cells carry oxygen and are used for blood loss replacement.  White blood cells fight against infection.  Platelets control bleeding.  Plasma helps clot blood.  Other blood products are available for specialized needs, such as hemophilia or other clotting disorders. BEFORE THE TRANSFUSION  Who gives blood for transfusions?   You may be able to donate blood to be used at a later date on yourself (autologous donation).  Relatives can be asked to donate blood. This is generally not any safer than if you have received blood from a stranger. The same precautions are taken to ensure safety when a relative's blood is donated.  Healthy volunteers who are fully evaluated to make sure their blood is safe. This is blood bank blood. Transfusion therapy is the safest it has ever been in the practice of medicine. Before blood is taken from a donor, a complete history is taken to make sure that person has no history of diseases nor engages in risky social behavior (examples are intravenous drug use or sexual activity with multiple partners). The donor's travel history is screened to minimize risk of transmitting infections, such as malaria. The donated blood is tested for signs of infectious diseases, such as HIV and hepatitis. The blood is then tested to be sure it is compatible with you in order to minimize the chance of a transfusion reaction. If you or a relative donates blood, this is often done in anticipation of surgery and is not appropriate for emergency situations. It takes many days to process the donated blood. RISKS AND COMPLICATIONS Although transfusion therapy is very safe and saves many lives, the main dangers of transfusion include:   Getting an infectious disease.  Developing a  transfusion reaction. This is an allergic reaction to something in the blood you were given. Every precaution is taken to prevent this. The decision to have a blood transfusion has been considered carefully by your caregiver before blood is given. Blood is not given unless the benefits outweigh the risks. AFTER THE TRANSFUSION  Right after receiving a blood transfusion, you will usually feel much better and more energetic. This is especially true if your red blood cells have gotten low (anemic). The transfusion raises the level of the red blood cells which carry oxygen, and this usually causes an energy increase.  The nurse administering the transfusion will monitor you carefully for complications. HOME CARE INSTRUCTIONS  No special instructions are needed after a transfusion. You may find your energy is better. Speak with your caregiver about any limitations on activity for underlying diseases you may have. SEEK MEDICAL CARE IF:   Your condition is not improving after your transfusion.  You develop redness or irritation at the intravenous (IV) site. SEEK IMMEDIATE MEDICAL CARE IF:  Any of the following symptoms occur over the next 12 hours:  Shaking chills.  You have a temperature by mouth above 102 F (38.9 C), not controlled by medicine.  Chest, back, or muscle pain.  People around you feel you are not acting correctly or are confused.  Shortness of breath or difficulty breathing.  Dizziness and fainting.  You get a rash or develop hives.  You have a decrease in urine output.  Your urine turns a dark color or changes to pink, red, or brown. Any of the following   symptoms occur over the next 10 days:  You have a temperature by mouth above 102 F (38.9 C), not controlled by medicine.  Shortness of breath.  Weakness after normal activity.  The white part of the eye turns yellow (jaundice).  You have a decrease in the amount of urine or are urinating less often.  Your  urine turns a dark color or changes to pink, red, or brown. Document Released: 01/05/2000 Document Revised: 04/01/2011 Document Reviewed: 08/24/2007 Exit[employer] Patient Information 2013 [employer].  

## 2012-01-30 NOTE — Telephone Encounter (Signed)
Pt scheduled for 1/20 lab and ML, instead of 1/17, ML do not have opening, ok per Ena Dawley RN

## 2012-01-31 ENCOUNTER — Encounter (HOSPITAL_COMMUNITY): Payer: Self-pay | Admitting: Pharmacy Technician

## 2012-01-31 ENCOUNTER — Other Ambulatory Visit: Payer: Self-pay | Admitting: Radiology

## 2012-01-31 LAB — TYPE AND SCREEN
ABO/RH(D): O POS
Donor AG Type: NEGATIVE

## 2012-02-05 ENCOUNTER — Encounter (HOSPITAL_COMMUNITY): Payer: Self-pay

## 2012-02-05 ENCOUNTER — Ambulatory Visit (HOSPITAL_COMMUNITY)
Admission: RE | Admit: 2012-02-05 | Discharge: 2012-02-05 | Disposition: A | Discharge status: Home / Self Care | Payer: Medicare Other | Source: Ambulatory Visit | Attending: Oncology | Admitting: Oncology

## 2012-02-05 DIAGNOSIS — E039 Hypothyroidism, unspecified: Secondary | ICD-10-CM | POA: Insufficient documentation

## 2012-02-05 DIAGNOSIS — D61818 Other pancytopenia: Secondary | ICD-10-CM | POA: Insufficient documentation

## 2012-02-05 DIAGNOSIS — Z87898 Personal history of other specified conditions: Secondary | ICD-10-CM | POA: Insufficient documentation

## 2012-02-05 DIAGNOSIS — C9110 Chronic lymphocytic leukemia of B-cell type not having achieved remission: Secondary | ICD-10-CM | POA: Insufficient documentation

## 2012-02-05 DIAGNOSIS — D469 Myelodysplastic syndrome, unspecified: Secondary | ICD-10-CM | POA: Insufficient documentation

## 2012-02-05 DIAGNOSIS — Z79899 Other long term (current) drug therapy: Secondary | ICD-10-CM | POA: Insufficient documentation

## 2012-02-05 HISTORY — DX: Cardiac murmur, unspecified: R01.1

## 2012-02-05 LAB — CBC
MCH: 28.3 pg (ref 26.0–34.0)
MCHC: 34.3 g/dL (ref 30.0–36.0)
MCV: 82.7 fL (ref 78.0–100.0)
Platelets: 8 10*3/uL — CL (ref 150–400)
RDW: 13.1 % (ref 11.5–15.5)
WBC: 0.9 10*3/uL — CL (ref 4.0–10.5)

## 2012-02-05 MED ORDER — MIDAZOLAM HCL 2 MG/2ML IJ SOLN
INTRAMUSCULAR | Status: AC
  Filled 2012-02-05: qty 4

## 2012-02-05 MED ORDER — MIDAZOLAM HCL 2 MG/2ML IJ SOLN
INTRAMUSCULAR | Status: AC | PRN
  Administered 2012-02-05 (×2): 1 mg via INTRAVENOUS

## 2012-02-05 MED ORDER — FENTANYL CITRATE 0.05 MG/ML IJ SOLN
INTRAMUSCULAR | Status: AC | PRN
  Administered 2012-02-05: 50 ug via INTRAVENOUS

## 2012-02-05 MED ORDER — FENTANYL CITRATE 0.05 MG/ML IJ SOLN
INTRAMUSCULAR | Status: AC
  Filled 2012-02-05: qty 4

## 2012-02-05 MED ORDER — SODIUM CHLORIDE 0.9 % IV SOLN
Freq: Once | INTRAVENOUS | Status: AC
  Administered 2012-02-05: 20 mL/h via INTRAVENOUS

## 2012-02-05 NOTE — Procedures (Signed)
Technically successful CT guided bone marrow aspiration and biopsy of left iliac crest. No immediate complications. Awaiting pathology report.    

## 2012-02-05 NOTE — Progress Notes (Addendum)
Pt here for pre procedure prep for bone marrow biopsy in radiology  CRITICAL VALUE ALERT  Critical value received:  WBC 0.9 ,Platelets "8" Date of notification: 02/05/12 Time of notification:  0845  Critical value read back:yes  Nurse who received alert:  Lamont Snowball RN  MD notified (1st page): Valetta Fuller PA for Radiology  Time of first page:  0840 MD notified (2nd page):  Time of second page:  Responding MD: Jeananne Rama PA  Time MD responded: [phone]

## 2012-02-05 NOTE — H&P (Signed)
Anne Doyle is an 79 y.o. female.   Chief Complaint:"I'm here for a bone marrow biopsy" HPI: Patient with history of CLL and severe pancytopenia presents today for restaging CT guided bone marrow biopsy.  PMH: CLL, left breast CIS, ? HTN, hypothyroidism, anxiety, heart murmur  Past Surgical History  Procedure Date  . Breast surgery   . Breast lumpectomy 2010    left  breast  hysterectomy  No family history on file. Social History:  reports that she has never smoked. She has never used smokeless tobacco. She reports that she does not drink alcohol or use illicit drugs.  Allergies:  Allergies  Allergen Reactions  . Pneumococcal Vaccines   . Sulfa Antibiotics Other (See Comments)    Flushing face     Current outpatient prescriptions:Ascorbic Acid (VITAMIN C PO), Take 1 tablet by mouth daily., Disp: , Rfl: ;  Calcium Carbonate-Vitamin D (CALCIUM 600 + D PO), Take 1 tablet by mouth daily., Disp: , Rfl: ;  levothyroxine (SYNTHROID, LEVOTHROID) 88 MCG tablet, Take 88 mcg by mouth daily before breakfast. , Disp: , Rfl: ;  ALPRAZolam (XANAX) 0.25 MG tablet, Take 0.25 mg by mouth every 8 (eight) hours as needed. anxiety, Disp: , Rfl:  HYDROcodone-acetaminophen (NORCO) 5-325 MG per tablet, Take 2 tablets by mouth every 8 (eight) hours as needed. Pain, Disp: , Rfl: ;  ibuprofen (ADVIL,MOTRIN) 200 MG tablet, Take 800 mg by mouth every 8 (eight) hours as needed. Pain, Disp: , Rfl: ;  prochlorperazine (COMPAZINE) 10 MG tablet, Take 10 mg by mouth every 6 (six) hours as needed. Nausea, Disp: , Rfl: ;  RESVERATROL PO, Take 1 capsule by mouth daily., Disp: , Rfl:  tamoxifen (NOLVADEX) 20 MG tablet, Take 20 mg by mouth daily. , Disp: , Rfl: ;  tretinoin (RETIN-A) 0.05 % cream, Apply 1 application topically at bedtime. , Disp: , Rfl:  Current facility-administered medications:0.9 %  sodium chloride infusion, , Intravenous, Once, Robet Leu, PA  Results for orders placed during the hospital  encounter of 01/27/12  TYPE AND SCREEN      Component Value Range   ABO/RH(D) O POS     Antibody Screen POS     Sample Expiration 01/30/2012     Antibody Identification ANTI-E ANTI-Cw     PT AG Type NEGATIVE FOR E ANTIGEN     DAT, IgG NEG     Unit Number [id number]     Blood Component Type RED CELLS,LR     Unit division 00     Status of Unit ISSUED,FINAL     Donor AG Type NEGATIVE FOR E ANTIGEN     Transfusion Status OK TO TRANSFUSE     Crossmatch Result COMPATIBLE     Unit Number [id number]     Blood Component Type RBC LR PHER1     Unit division 00     Status of Unit ISSUED,FINAL     Donor AG Type NEGATIVE FOR E ANTIGEN     Transfusion Status OK TO TRANSFUSE     Crossmatch Result COMPATIBLE    PREPARE RBC (CROSSMATCH)      Component Value Range   Order Confirmation ORDER PROCESSED BY BLOOD BANK    02/05/12 labs pending  Review of Systems  Constitutional: Negative for fever and chills.  Respiratory: Negative for cough and shortness of breath.   Cardiovascular: Negative for chest pain.  Gastrointestinal: Negative for nausea, vomiting and abdominal pain.  Musculoskeletal: Negative for back pain.  Neurological: Negative for  headaches.  Endo/Heme/Allergies: Bruises/bleeds easily.    Blood pressure 148/65, pulse 86, temperature 98.3 F (36.8 C), temperature source Oral, resp. rate 18, height 5\' 5"  (1.651 m), weight 150 lb (68.04 kg). Physical Exam  Constitutional: She is oriented to person, place, and time. She appears well-developed and well-nourished.  Cardiovascular: Normal rate and regular rhythm.   Respiratory: Effort normal and breath sounds normal.  GI: Soft. Bowel sounds are normal.  Musculoskeletal: Normal range of motion. She exhibits no edema.  Neurological: She is alert and oriented to person, place, and time.     Assessment/Plan Patient with history of CLL and severe pancytopenia. Plan is for restaging CT guided bone marrow biopsy today. Details/risks of  procedure d/w pt/husband with their understanding and consent.   Aubria Vanecek,D KEVIN 02/05/2012, 8:02 AM

## 2012-02-05 NOTE — Progress Notes (Signed)
Pt dc'd to home with husband.  Accompanied by staff via wheelchair to hospital exit. Anne Doyle

## 2012-02-10 ENCOUNTER — Other Ambulatory Visit (HOSPITAL_BASED_OUTPATIENT_CLINIC_OR_DEPARTMENT_OTHER): Payer: Medicare Other | Admitting: Lab

## 2012-02-10 ENCOUNTER — Telehealth: Payer: Self-pay | Admitting: Oncology

## 2012-02-10 ENCOUNTER — Ambulatory Visit: Payer: Medicare Other | Admitting: Lab

## 2012-02-10 ENCOUNTER — Ambulatory Visit (HOSPITAL_BASED_OUTPATIENT_CLINIC_OR_DEPARTMENT_OTHER): Payer: Medicare Other | Admitting: Nurse Practitioner

## 2012-02-10 ENCOUNTER — Other Ambulatory Visit: Payer: Self-pay | Admitting: *Deleted

## 2012-02-10 ENCOUNTER — Ambulatory Visit: Payer: Medicare Other

## 2012-02-10 ENCOUNTER — Ambulatory Visit: Payer: Medicare Other | Admitting: Nurse Practitioner

## 2012-02-10 VITALS — BP 150/86 | HR 104 | Temp 99.6°F | Resp 18 | Ht 65.0 in | Wt 146.3 lb

## 2012-02-10 VITALS — BP 114/61 | HR 86 | Temp 97.5°F | Resp 18

## 2012-02-10 DIAGNOSIS — D469 Myelodysplastic syndrome, unspecified: Secondary | ICD-10-CM

## 2012-02-10 DIAGNOSIS — D649 Anemia, unspecified: Secondary | ICD-10-CM

## 2012-02-10 DIAGNOSIS — D61818 Other pancytopenia: Secondary | ICD-10-CM

## 2012-02-10 DIAGNOSIS — C9110 Chronic lymphocytic leukemia of B-cell type not having achieved remission: Secondary | ICD-10-CM

## 2012-02-10 LAB — CBC WITH DIFFERENTIAL/PLATELET
BASO%: 0 % (ref 0.0–2.0)
LYMPH%: 32.4 % (ref 14.0–49.7)
MCH: 27.5 pg (ref 25.1–34.0)
MCHC: 34.2 g/dL (ref 31.5–36.0)
MCV: 80.6 fL (ref 79.5–101.0)
MONO%: 6.8 % (ref 0.0–14.0)
Platelets: 9 10*3/uL — CL (ref 145–400)
RBC: 2.47 10*6/uL — ABNORMAL LOW (ref 3.70–5.45)
nRBC: 0 % (ref 0–0)

## 2012-02-10 MED ORDER — SODIUM CHLORIDE 0.9 % IV SOLN
250.0000 mL | Freq: Once | INTRAVENOUS | Status: AC
  Administered 2012-02-10: 250 mL via INTRAVENOUS

## 2012-02-10 NOTE — Patient Instructions (Addendum)
Blood Products Information This is information about transfusions of blood products. All blood that is to be transfused is tested for blood type, compatibility with the recipient, and for infections. Except in emergencies, giving a transfusion requires a written consent. Blood transfusions are often given as packed red blood cells. This means the other parts of the blood have been taken out. Blood may be needed to treat severe anemia or bleeding. Other blood products include plasma, platelets, immune globulin, and cryoprecipitate. Blood for transfusion is mostly donated by volunteers. The blood donors are carefully screened for risk factors that could cause disease. Donors are all tested for infections that could be transmitted by blood. The blood product supply today is the safest it has ever been. Some risks do remain.  A minor reaction with fever, chills, or rash happens in about 1% of blood product transfusions.  Life-threatening reactions occur in less than 1 in a million transfusions.  Infection with germs (bacteria), viruses or parasites like malaria can still happen. The risk is very low.  Hepatitis B occurs in about 1 case in 150,000 transfusions.  Hepatitis C is seen once in 500,000.  HIV is transmitted less than once every million transfusions. When you receive a transfusion of packed red blood cells, your blood is tested for blood group and Rh type. Your blood is also screened for antibodies that could cause a serious reaction. A cross-match test is done to make sure the blood is safe to give.  Talk with your caregiver if you have any concerns about receiving a transfusion of blood products. Make sure your questions are answered. Transfusions are not given if your caregiver feels the risk is greater than the need. Document Released: 01/07/2005 Document Revised: 04/01/2011 Document Reviewed: 06/27/2006 ExitCare Patient Information 2013 [employer].  

## 2012-02-10 NOTE — Progress Notes (Signed)
OFFICE PROGRESS NOTE  Interval history:  Anne Doyle returns as scheduled. She underwent a bone marrow biopsy on 02/05/2012 with findings of a hypercellular bone marrow for age with dysmegakaryopoiesis and relative abundance of granulocytic cells. 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 A small lymphoid accurate was present. Flow cytometry did not show any monoclonal B-cell population or abnormal T cell phenotype.  She continues to note easy bruising. She has several bruises at the left buccal mucosa and a single bruise at the left forearm. She denies bleeding. No fever or chills. She feels that she needs a blood transfusion.   Objective: Blood pressure 150/86, pulse 104, temperature 99.6 F (37.6 C), temperature source Oral, resp. rate 18, height 5\' 5"  (1.651 m), weight 146 lb 4.8 oz (66.361 kg).  Oropharynx is without thrush or ulceration. Ecchymosis at the left buccal mucosa. Lungs are clear. Regular cardiac rhythm. Abdomen soft and nontender. No organomegaly. Extremities are without edema. Small ecchymosis at the left lower forearm.  Lab Results: Lab Results  Component Value Date   WBC 0.7* 02/10/2012   HGB 6.8* 02/10/2012   HCT 19.9* 02/10/2012   MCV 80.6 02/10/2012   PLT 9* 02/10/2012    Chemistry:    Chemistry      Component Value Date/Time   NA 141 12/02/2011 1321   NA 141 09/02/2011 0928   K 4.0 12/02/2011 1321   K 4.0 09/02/2011 0928   CL 105 12/02/2011 1321   CL 105 09/02/2011 0928   CO2 30* 12/02/2011 1321   CO2 28 09/02/2011 0928   BUN 18.0 12/02/2011 1321   BUN 15 09/02/2011 0928   CREATININE 0.8 12/02/2011 1321   CREATININE 0.71 09/02/2011 0928      Component Value Date/Time   CALCIUM 9.8 12/02/2011 1321   CALCIUM 8.7 09/02/2011 0928   ALKPHOS 82 12/02/2011 1321   ALKPHOS 73 09/02/2011 0928   AST 25 12/02/2011 1321   AST 13 09/02/2011 0928   ALT 51 12/02/2011 1321   ALT 18 09/02/2011 0928   BILITOT 0.73 12/02/2011 1321   BILITOT 0.5 09/02/2011 0928        Studies/Results: Ct Biopsy  02/05/2012  *RADIOLOGY REPORT*  Indication: Pancytopenia, CLL, MDS  CT GUIDED LEFT ILIAC BONE MARROW ASPIRATION AND BONE MARROW CORE BIOPSIES  Intravenous medications: Fentanyl 50 mcg IV; Versed 2 mg IV  Sedation time: 15 minutes  Contrast volume: None  Complications: None immediate  PROCEDURE/FINDINGS:  Informed consent was obtained from the patient following an explanation of the procedure, risks, benefits and alternatives. The patient understands, agrees and consents for the procedure. All questions were addressed.  A time out was performed prior to the initiation of the procedure.  The patient was positioned prone and noncontrast localization CT was performed of the pelvis to demonstrate the iliac marrow spaces.  The operative site was prepped and draped in the usual sterile fashion.  Under sterile conditions and local anesthesia, an 11 gauge coaxial bone biopsy needle was advanced into the left iliac marrow space. Needle position was confirmed with CT imaging.  Initially, bone marrow aspiration was performed. Next, a bone marrow biopsy was obtained with the 11 gauge outer bone marrow device. The 11 gauge coaxial bone biopsy needle was readvanced into a slightly different location within the left iliac marrow space, positioning was confirmed and an additional bone marrow aspiration and biopsy were obtained.  Samples were prepared with the cytotechnologist and deemed adequate.  The needle was removed intact.  Hemostasis was obtained with compression  and a dressing was placed. The patient tolerated the procedure well without immediate post procedural complication.  IMPRESSION:  Successful CT guided left iliac bone marrow aspiration and core biopsies.   Original Report Authenticated By: Tacey Ruiz, MD     Medications: I have reviewed the patient's current medications.  Assessment/Plan:  1. Chronic lymphocytic leukemia, status post two cycles of fludarabine/rituximab  with cycle #2 beginning on 05/08/2009. 2. Pancytopenia, status post a follow-up bone marrow biopsy, 09/01/2009, with involvement by CLL identified.  3. History of a prolonged bleeding time. A diagnostic evaluation including a von Willebrand panel and platelet function studies were negative. 4. Left breast ductal carcinoma in situ, status post a needle localized lumpectomy, 07/19/2008, followed by left breast radiation. She continues tamoxifen. 5. Chills during the Rituxan infusion, 04/05/2009. She was premedicated with Decadron at home prior to cycle #2. She tolerated cycle #2 without significant acute toxicity aside from upper chest and facial erythema. 6. Marked neutropenia on labs, 11/22 and 12/19/2009, likely a delayed nadir related to rituximab. Resolved. 7. Macrocytic anemia-progressive. A laboratory evaluation on 01/14/2011 did not suggest hemolysis. A DAT was negative and the reticulocyte count was low on 01/28/2011. Bone marrow aspiration/biopsy 02/07/2011 showed a hypercellular marrow for age with dyspoietic changes primarily involving the erythroid and megakaryocytic cell lines. There was no morphologic or immunophenotypic evidence of a B-cell lymphoproliferative process. Cytogenetics returned normal. She began a trial of weekly Procrit on 02/25/2011. She continues intermittent red cell transfusion support. Cycle 1 of 5 azacytidine started on 06/18/11. Cycle 2 of 5-azacytidine at a reduced dose beginning 08/05/2011. She received Neulasta support with cycle 2. Cycle 3 of 5-azacytidine beginning 09/09/2011. She again receive Neulasta support. Procrit was discontinued following an office visit on 10/07/2011 due to no apparent benefit. Cycle 4 of 5-azacytidine on 10/28/2011. Repeat bone marrow on 02/05/2012 showed a hypercellular bone marrow for age with dysmegakaryopoiesis and relative abundance of granulocytic cells. 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 A small lymphoid accurate was present. Flow cytometry  did not show any monoclonal B-cell population or abnormal T cell phenotype. 8. Neutropenia/progressive thrombocytopenia following 5-azacytidine.  9. Cough and sore throat with abnormal lung exam on 08/19/2011. Negative chest x-ray. She completed a course of azithromycin. Symptoms resolved.  Disposition-Anne Doyle has completed 4 cycles of 5-azacytidine. She last received 5-azacytidine in October 2013. She has been unable to receive additional chemotherapy secondary to persistent neutropenia/thrombocytopenia.   She has persistent severe pancytopenia. Bone marrow biopsy on 02/05/2012 with findings consistent with MDS.  Dr. Truett Perna will contact Dr. Lowell Guitar to discuss treatment options.  We are arranging for a red cell transfusion today. We will continue to check labs on a weekly schedule with transfusion support as needed. She will return for a followup visit in one month. She will contact the office in the interim with any problems. We specifically discussed increased bruising, bleeding, fever, chills, other signs of infection.   Patient seen with Dr. Truett Perna. Dr. Truett Perna reviewed the bone marrow findings with Anne Doyle and her husband.  Lonna Cobb ANP/GNP-BC

## 2012-02-10 NOTE — Telephone Encounter (Signed)
gv and printed appt schedule for opt for Feb pt aware

## 2012-02-10 NOTE — Telephone Encounter (Signed)
gv and pritned appt [facility]duel for pt for Jan and Feb...the patient aware

## 2012-02-11 LAB — TYPE AND SCREEN
Donor AG Type: NEGATIVE
Unit division: 0
Unit division: 0

## 2012-02-12 ENCOUNTER — Telehealth: Payer: Self-pay | Admitting: Oncology

## 2012-02-12 ENCOUNTER — Encounter: Payer: Self-pay | Admitting: *Deleted

## 2012-02-12 NOTE — Progress Notes (Signed)
Per Dr. Truett Perna: Needs follow up appointment with Dr. Miachel Roux ASAP at [facility]. Myelodysplasia-wants to discuss clinical trial. Request forwarded to HIM department.

## 2012-02-12 NOTE — Telephone Encounter (Signed)
Pt appt with Dr. Lowell Guitar @ [facility] 02/14/12@12 :45. Medical records faxed

## 2012-02-17 ENCOUNTER — Other Ambulatory Visit: Payer: Self-pay | Admitting: *Deleted

## 2012-02-17 ENCOUNTER — Other Ambulatory Visit: Payer: Self-pay | Admitting: Oncology

## 2012-02-17 ENCOUNTER — Other Ambulatory Visit: Payer: Medicare Other | Admitting: Lab

## 2012-02-17 DIAGNOSIS — D469 Myelodysplastic syndrome, unspecified: Secondary | ICD-10-CM

## 2012-02-18 ENCOUNTER — Other Ambulatory Visit: Payer: Self-pay | Admitting: Radiology

## 2012-02-18 ENCOUNTER — Encounter (HOSPITAL_COMMUNITY): Payer: Self-pay | Admitting: Pharmacy Technician

## 2012-02-18 ENCOUNTER — Telehealth: Payer: Self-pay | Admitting: Oncology

## 2012-02-18 NOTE — Telephone Encounter (Signed)
Called IR and left message with Inetta Fermo regarding porta cath for this week

## 2012-02-19 ENCOUNTER — Other Ambulatory Visit: Payer: Self-pay | Admitting: Radiology

## 2012-02-19 ENCOUNTER — Other Ambulatory Visit (HOSPITAL_COMMUNITY): Payer: Self-pay | Admitting: Interventional Radiology

## 2012-02-21 ENCOUNTER — Ambulatory Visit (HOSPITAL_COMMUNITY)
Admission: RE | Admit: 2012-02-21 | Discharge: 2012-02-21 | Disposition: A | Discharge status: Home / Self Care | Payer: Medicare Other | Source: Ambulatory Visit | Attending: Oncology | Admitting: Oncology

## 2012-02-21 ENCOUNTER — Other Ambulatory Visit: Payer: Self-pay | Admitting: Oncology

## 2012-02-21 ENCOUNTER — Encounter (HOSPITAL_COMMUNITY): Payer: Self-pay

## 2012-02-21 VITALS — BP 114/69 | HR 88 | Temp 98.6°F | Resp 20 | Ht 65.0 in | Wt 146.0 lb

## 2012-02-21 DIAGNOSIS — D649 Anemia, unspecified: Secondary | ICD-10-CM

## 2012-02-21 DIAGNOSIS — C9110 Chronic lymphocytic leukemia of B-cell type not having achieved remission: Secondary | ICD-10-CM | POA: Insufficient documentation

## 2012-02-21 DIAGNOSIS — Z79899 Other long term (current) drug therapy: Secondary | ICD-10-CM | POA: Insufficient documentation

## 2012-02-21 DIAGNOSIS — D61818 Other pancytopenia: Secondary | ICD-10-CM | POA: Insufficient documentation

## 2012-02-21 DIAGNOSIS — D469 Myelodysplastic syndrome, unspecified: Secondary | ICD-10-CM

## 2012-02-21 DIAGNOSIS — D0590 Unspecified type of carcinoma in situ of unspecified breast: Secondary | ICD-10-CM | POA: Insufficient documentation

## 2012-02-21 HISTORY — DX: Malignant neoplasm of unspecified site of unspecified female breast: C50.919

## 2012-02-21 HISTORY — DX: Chronic lymphocytic leukemia of B-cell type not having achieved remission: C91.10

## 2012-02-21 HISTORY — DX: Other pancytopenia: D61.818

## 2012-02-21 HISTORY — DX: Anxiety disorder, unspecified: F41.9

## 2012-02-21 LAB — BASIC METABOLIC PANEL
Chloride: 97 mEq/L (ref 96–112)
GFR calc Af Amer: 78 mL/min — ABNORMAL LOW (ref >90)
Potassium: 3.8 mEq/L (ref 3.5–5.1)

## 2012-02-21 LAB — CBC
HCT: 23.8 % — ABNORMAL LOW (ref 36.0–46.0)
Hemoglobin: 8.3 g/dL — ABNORMAL LOW (ref 12.0–15.0)
RBC: 2.98 MIL/uL — ABNORMAL LOW (ref 3.87–5.11)
WBC: 1.4 10*3/uL — CL (ref 4.0–10.5)

## 2012-02-21 LAB — PROTIME-INR: INR: 1.03 (ref 0.00–1.49)

## 2012-02-21 LAB — APTT: aPTT: 30 seconds (ref 24–37)

## 2012-02-21 MED ORDER — MIDAZOLAM HCL 2 MG/2ML IJ SOLN
INTRAMUSCULAR | Status: AC
  Filled 2012-02-21: qty 6

## 2012-02-21 MED ORDER — CEFAZOLIN SODIUM 1-5 GM-% IV SOLN
1.0000 g | Freq: Once | INTRAVENOUS | Status: AC
  Administered 2012-02-21: 1 g via INTRAVENOUS
  Filled 2012-02-21: qty 50

## 2012-02-21 MED ORDER — HEPARIN SOD (PORK) LOCK FLUSH 100 UNIT/ML IV SOLN
500.0000 [IU] | Freq: Once | INTRAVENOUS | Status: AC
  Administered 2012-02-21: 500 [IU] via INTRAVENOUS

## 2012-02-21 MED ORDER — FENTANYL CITRATE 0.05 MG/ML IJ SOLN
INTRAMUSCULAR | Status: AC | PRN
  Administered 2012-02-21 (×2): 50 ug via INTRAVENOUS

## 2012-02-21 MED ORDER — LIDOCAINE HCL 1 % IJ SOLN
INTRAMUSCULAR | Status: AC
  Filled 2012-02-21: qty 20

## 2012-02-21 MED ORDER — FENTANYL CITRATE 0.05 MG/ML IJ SOLN
INTRAMUSCULAR | Status: AC
  Filled 2012-02-21: qty 6

## 2012-02-21 MED ORDER — MIDAZOLAM HCL 2 MG/2ML IJ SOLN
INTRAMUSCULAR | Status: AC | PRN
  Administered 2012-02-21 (×2): 1 mg via INTRAVENOUS

## 2012-02-21 MED ORDER — SODIUM CHLORIDE 0.9 % IV SOLN
INTRAVENOUS | Status: AC
  Administered 2012-02-21: 13:00:00 via INTRAVENOUS

## 2012-02-21 MED ORDER — SODIUM CHLORIDE 0.9 % IV SOLN: 250.0000 mL | Freq: Once | INTRAVENOUS | Status: DC

## 2012-02-21 MED ORDER — SODIUM CHLORIDE 0.9 % IV SOLN
Freq: Once | INTRAVENOUS | Status: AC
  Administered 2012-02-21: 11:00:00 via INTRAVENOUS

## 2012-02-21 NOTE — H&P (Signed)
Agree.  2 complete units of platelets to be transfused right before port placement.

## 2012-02-21 NOTE — Procedures (Signed)
Procedure:  Porta-cath placement Access:  Right IJ vein Tip:  Cavoatrial junction OK to use.

## 2012-02-21 NOTE — H&P (Signed)
Anne Doyle is an 79 y.o. female.   Chief Complaint: I am here for a port a cath placement.  HPI: see oncology note below from most recent following BM needle core biopsy :   Rana Snare, NP Nurse Practitioner Signed  Progress Notes 02/10/2012 12:53 PM  Related encounter: Office Visit from 02/10/2012 in [facility]  OFFICE PROGRESS NOTE  Interval history:   Anne Doyle returns as scheduled. She underwent a bone marrow biopsy on 02/05/2012 with findings of a hypercellular bone marrow for age with dysmegakaryopoiesis and relative abundance of granulocytic cells. 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 A small lymphoid accurate was present. Flow cytometry did not show any monoclonal B-cell population or abnormal T cell phenotype.  She continues to note easy bruising. She has several bruises at the left buccal mucosa and a single bruise at the left forearm. She denies bleeding. No fever or chills. She feels that she needs a blood transfusion.   Objective: Blood pressure 150/86, pulse 104, temperature 99.6 F (37.6 C), temperature source Oral, resp. rate 18, height 5\' 5"  (1.651 m), weight 146 lb 4.8 oz (66.361 kg).  Oropharynx is without thrush or ulceration. Ecchymosis at the left buccal mucosa. Lungs are clear. Regular cardiac rhythm. Abdomen soft and nontender. No organomegaly. Extremities are without edema. Small ecchymosis at the left lower forearm.  Lab Results: Lab Results   Component  Value  Date     WBC  0.7*  02/10/2012     HGB  6.8*  02/10/2012     HCT  19.9*  02/10/2012     MCV  80.6  02/10/2012     PLT  9*  02/10/2012     Chemistry:        Chemistry        Component  Value  Date/Time     NA  141  12/02/2011 1321     NA  141  09/02/2011 0928     K  4.0  12/02/2011 1321     K  4.0  09/02/2011 0928     CL  105  12/02/2011 1321     CL  105  09/02/2011 0928     CO2  30*  12/02/2011 1321     CO2  28  09/02/2011 0928     BUN  18.0  12/02/2011 1321      BUN  15  09/02/2011 0928     CREATININE  0.8  12/02/2011 1321     CREATININE  0.71  09/02/2011 0928        Component  Value  Date/Time     CALCIUM  9.8  12/02/2011 1321     CALCIUM  8.7  09/02/2011 0928     ALKPHOS  82  12/02/2011 1321     ALKPHOS  73  09/02/2011 0928     AST  25  12/02/2011 1321     AST  13  09/02/2011 0928     ALT  51  12/02/2011 1321     ALT  18  09/02/2011 0928     BILITOT  0.73  12/02/2011 1321     BILITOT  0.5  09/02/2011 0928         Studies/Results: Ct Biopsy  02/05/2012  *RADIOLOGY REPORT*  Indication: Pancytopenia, CLL, MDS  CT GUIDED LEFT ILIAC BONE MARROW ASPIRATION AND BONE MARROW CORE BIOPSIES  Intravenous medications: Fentanyl 50 mcg IV; Versed 2 mg IV  Sedation time: 15 minutes  Contrast volume: None  Complications: None immediate  PROCEDURE/FINDINGS:  Informed consent was obtained from the patient following an explanation of the procedure, risks, benefits and alternatives. The patient understands, agrees and consents for the procedure. All questions were addressed.  A time out was performed prior to the initiation of the procedure.  The patient was positioned prone and noncontrast localization CT was performed of the pelvis to demonstrate the iliac marrow spaces.  The operative site was prepped and draped in the usual sterile fashion.  Under sterile conditions and local anesthesia, an 11 gauge coaxial bone biopsy needle was advanced into the left iliac marrow space. Needle position was confirmed with CT imaging.  Initially, bone marrow aspiration was performed. Next, a bone marrow biopsy was obtained with the 11 gauge outer bone marrow device. The 11 gauge coaxial bone biopsy needle was readvanced into a slightly different location within the left iliac marrow space, positioning was confirmed and an additional bone marrow aspiration and biopsy were obtained.  Samples were prepared with the cytotechnologist and deemed adequate.  The needle was removed intact.   Hemostasis was obtained with compression and a dressing was placed. The patient tolerated the procedure well without immediate post procedural complication.  IMPRESSION:  Successful CT guided left iliac bone marrow aspiration and core biopsies.   Original Report Authenticated By: Tacey Ruiz, MD     Medications: I have reviewed the patient's current medications.  Assessment/Plan:    1. Chronic lymphocytic leukemia, status post two cycles of fludarabine/rituximab with cycle #2 beginning on 05/08/2009.  2. Pancytopenia, status post a follow-up bone marrow biopsy, 09/01/2009, with involvement by CLL identified.    3. History of a prolonged bleeding time. A diagnostic evaluation including a von Willebrand panel and platelet function studies were negative.  4. Left breast ductal carcinoma in situ, status post a needle localized lumpectomy, 07/19/2008, followed by left breast radiation. She continues tamoxifen.  5. Chills during the Rituxan infusion, 04/05/2009. She was premedicated with Decadron at home prior to cycle #2. She tolerated cycle #2 without significant acute toxicity aside from upper chest and facial erythema.  6. Marked neutropenia on labs, 11/22 and 12/19/2009, likely a delayed nadir related to rituximab. Resolved.  7. Macrocytic anemia-progressive. A laboratory evaluation on 01/14/2011 did not suggest hemolysis. A DAT was negative and the reticulocyte count was low on 01/28/2011. Bone marrow aspiration/biopsy 02/07/2011 showed a hypercellular marrow for age with dyspoietic changes primarily involving the erythroid and megakaryocytic cell lines. There was no morphologic or immunophenotypic evidence of a B-cell lymphoproliferative process. Cytogenetics returned normal. She began a trial of weekly Procrit on 02/25/2011. She continues intermittent red cell transfusion support. Cycle 1 of 5 azacytidine started on 06/18/11. Cycle 2 of 5-azacytidine at a reduced dose beginning 08/05/2011. She  received Neulasta support with cycle 2. Cycle 3 of 5-azacytidine beginning 09/09/2011. She again receive Neulasta support. Procrit was discontinued following an office visit on 10/07/2011 due to no apparent benefit. Cycle 4 of 5-azacytidine on 10/28/2011. Repeat bone marrow on 02/05/2012 showed a hypercellular bone marrow for age with dysmegakaryopoiesis and relative abundance of granulocytic cells. 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 A small lymphoid accurate was present. Flow cytometry did not show any monoclonal B-cell population or abnormal T cell phenotype.  8. Neutropenia/progressive thrombocytopenia following 5-azacytidine.    9. Cough and sore throat with abnormal lung exam on 08/19/2011. Negative chest x-ray. She completed a course of azithromycin. Symptoms resolved.  Disposition-Anne Doyle has completed 4 cycles of 5-azacytidine. She  last received 5-azacytidine in October 2013. She has been unable to receive additional chemotherapy secondary to persistent neutropenia/thrombocytopenia.   She has persistent severe pancytopenia. Bone marrow biopsy on 02/05/2012 with findings consistent with MDS.  Dr. Truett Perna will contact Dr. Lowell Guitar to discuss treatment options.  We are arranging for a red cell transfusion today. We will continue to check labs on a weekly schedule with transfusion support as needed. She will return for a followup visit in one month. She will contact the office in the interim with any problems. We specifically discussed increased bruising, bleeding, fever, chills, other signs of infection.   Patient seen with Dr. Truett Perna. Dr. Truett Perna reviewed the bone marrow findings with Anne Doyle and her husband.  Lonna Cobb ANP/GNP-BC     Today's examination findings :    Past Medical History  Diagnosis Date  . Heart murmur   . CLL (chronic lymphocytic leukemia)   . Breast cancer     left   . Pancytopenia   . Anxiety     Past Surgical History  Procedure Date  . Breast  surgery   . Breast lumpectomy 2010    left  breast  . Vaginal hysterectomy   . Bone marrow needle core biopsy     No family history on file. Social History:  reports that she has never smoked. She has never used smokeless tobacco. She reports that she does not drink alcohol or use illicit drugs.  Allergies:  Allergies  Allergen Reactions  . Pneumococcal Vaccines Other (See Comments)    Arms swell  . Sulfa Antibiotics Other (See Comments)    Flushing face      Medication List     As of 02/21/2012 12:03 PM    ASK your doctor about these medications         ALPRAZolam 0.25 MG tablet   Commonly known as: XANAX   Take 0.25 mg by mouth every 8 (eight) hours as needed. For anxiety.      CALCIUM 600 + D PO   Take 1 tablet by mouth every morning.      levothyroxine 88 MCG tablet   Commonly known as: SYNTHROID, LEVOTHROID   Take 88 mcg by mouth daily before breakfast.      prochlorperazine 10 MG tablet   Commonly known as: COMPAZINE   Take 10 mg by mouth every 6 (six) hours as needed. For nausea.      RESVERATROL PO   Take 1 capsule by mouth every morning.      vitamin C 1000 MG tablet   Take 1,000 mg by mouth every morning.           Results for orders placed during the hospital encounter of 02/21/12 (from the past 48 hour(s))  APTT     Status: Normal   Collection Time   02/21/12 10:50 AM      Component Value Range Comment   aPTT 30  24 - 37 seconds   BASIC METABOLIC PANEL     Status: Abnormal   Collection Time   02/21/12 10:50 AM      Component Value Range Comment   Sodium 137  135 - 145 mEq/L    Potassium 3.8  3.5 - 5.1 mEq/L    Chloride 97  96 - 112 mEq/L    CO2 31  19 - 32 mEq/L    Glucose, Bld 116 (*) 70 - 99 mg/dL    BUN 15  6 - 23 mg/dL  Creatinine, Ser 0.80  0.50 - 1.10 mg/dL    Calcium 9.4  8.4 - 16.1 mg/dL    GFR calc non Af Amer 67 (*) >90 mL/min    GFR calc Af Amer 78 (*) >90 mL/min   CBC     Status: Abnormal   Collection Time   02/21/12  10:50 AM      Component Value Range Comment   WBC 1.4 (*) 4.0 - 10.5 K/uL    RBC 2.98 (*) 3.87 - 5.11 MIL/uL    Hemoglobin 8.3 (*) 12.0 - 15.0 g/dL    HCT 09.6 (*) 04.5 - 46.0 %    MCV 79.9  78.0 - 100.0 fL    MCH 27.9  26.0 - 34.0 pg    MCHC 34.9  30.0 - 36.0 g/dL    RDW 40.9  81.1 - 91.4 %    Platelets 9 (*) 150 - 400 K/uL   PROTIME-INR     Status: Normal   Collection Time   02/21/12 10:50 AM      Component Value Range Comment   Prothrombin Time 13.4  11.6 - 15.2 seconds    INR 1.03  0.00 - 1.49   PREPARE PLATELET PHERESIS     Status: Normal (Preliminary result)   Collection Time   02/21/12 12:02 PM      Component Value Range Comment   Unit Number [id number]      Blood Component Type PLTPHER LR2      Unit division 00      Status of Unit ISSUED      Transfusion Status OK TO TRANSFUSE      Unit Number [id number]      Blood Component Type PLTPHER LR2      Unit division 00      Status of Unit ALLOCATED      Transfusion Status OK TO TRANSFUSE       Review of Systems  Constitutional: Positive for malaise/fatigue. Negative for fever, chills, weight loss and diaphoresis.  Eyes: Negative.   Respiratory: Negative.   Cardiovascular: Negative for chest pain, palpitations, orthopnea and leg swelling.  Gastrointestinal: Negative.   Neurological: Negative for dizziness, seizures and loss of consciousness.  Endo/Heme/Allergies: Bruises/bleeds easily.  Psychiatric/Behavioral: Negative for memory loss. The patient is nervous/anxious.     Physical Exam  Constitutional: She is oriented to person, place, and time. She appears well-developed and well-nourished. No distress.  HENT:  Head: Normocephalic and atraumatic.  Eyes: Pupils are equal, round, and reactive to light.  Neck: Normal range of motion.  Cardiovascular: Normal rate and regular rhythm.  Exam reveals no gallop and no friction rub.   Murmur heard. Respiratory: Effort normal and breath sounds normal. No respiratory  distress. She has no wheezes. She has no rales.  GI: Soft. Bowel sounds are normal. She exhibits no distension.  Musculoskeletal: Normal range of motion. She exhibits no edema.  Lymphadenopathy:    She has no cervical adenopathy.  Neurological: She is alert and oriented to person, place, and time.  Skin: Skin is warm and dry.  Psychiatric: She has a normal mood and affect. Her behavior is normal. Judgment and thought content normal.     Assessment/Plan Port a cath placement details, reasons for same and potential complications including but not limited to infection, bleeding, vessel damage, pneumothorax, malpositioning/malfunctioning port and complications with moderate sedation discussed with all questions answered to patient's satisfaction. Plan for port placement later today after all transfusions complete. Written consent obtained.  Alaiya Martindelcampo D 02/21/2012, 12:03 PM

## 2012-02-22 LAB — PREPARE PLATELET PHERESIS: Unit division: 0

## 2012-02-24 ENCOUNTER — Other Ambulatory Visit: Payer: Self-pay | Admitting: *Deleted

## 2012-02-24 ENCOUNTER — Telehealth: Payer: Self-pay | Admitting: *Deleted

## 2012-02-24 ENCOUNTER — Ambulatory Visit: Payer: Medicare Other

## 2012-02-24 ENCOUNTER — Encounter (HOSPITAL_COMMUNITY)
Admission: RE | Admit: 2012-02-24 | Discharge: 2012-02-24 | Disposition: A | Discharge status: Home / Self Care | Payer: Medicare Other | Source: Ambulatory Visit | Attending: Oncology | Admitting: Oncology

## 2012-02-24 ENCOUNTER — Other Ambulatory Visit (HOSPITAL_BASED_OUTPATIENT_CLINIC_OR_DEPARTMENT_OTHER): Payer: Medicare Other | Admitting: Lab

## 2012-02-24 VITALS — BP 130/75 | HR 89 | Temp 98.7°F | Resp 18

## 2012-02-24 DIAGNOSIS — D469 Myelodysplastic syndrome, unspecified: Secondary | ICD-10-CM | POA: Insufficient documentation

## 2012-02-24 DIAGNOSIS — D649 Anemia, unspecified: Secondary | ICD-10-CM

## 2012-02-24 LAB — CBC WITH DIFFERENTIAL/PLATELET
Basophils Absolute: 0 10*3/uL (ref 0.0–0.1)
Eosinophils Absolute: 0 10*3/uL (ref 0.0–0.5)
HCT: 20.4 % — ABNORMAL LOW (ref 34.8–46.6)
LYMPH%: 31.1 % (ref 14.0–49.7)
MCHC: 34.4 g/dL (ref 31.5–36.0)
MONO#: 0.1 10*3/uL (ref 0.1–0.9)
NEUT#: 0.7 10*3/uL — ABNORMAL LOW (ref 1.5–6.5)
NEUT%: 61.5 % (ref 38.4–76.8)
Platelets: 26 10*3/uL — ABNORMAL LOW (ref 145–400)
WBC: 1.1 10*3/uL — ABNORMAL LOW (ref 3.9–10.3)

## 2012-02-24 MED ORDER — SODIUM CHLORIDE 0.9 % IV SOLN
250.0000 mL | Freq: Once | INTRAVENOUS | Status: AC
  Administered 2012-02-24: 250 mL via INTRAVENOUS

## 2012-02-24 MED ORDER — DIPHENOXYLATE-ATROPINE 2.5-0.025 MG PO TABS
2.0000 | ORAL_TABLET | Freq: Once | ORAL | Status: AC
  Administered 2012-02-24: 2 via ORAL

## 2012-02-24 MED ORDER — LIDOCAINE-PRILOCAINE 2.5-2.5 % EX CREA: TOPICAL_CREAM | CUTANEOUS | Status: DC | PRN

## 2012-02-24 MED ORDER — LORAZEPAM 1 MG PO TABS: 0.5000 mg | ORAL_TABLET | Freq: Once | ORAL | Status: DC

## 2012-02-24 MED ORDER — HEPARIN SOD (PORK) LOCK FLUSH 100 UNIT/ML IV SOLN
500.0000 [IU] | Freq: Every day | INTRAVENOUS | Status: AC | PRN
  Administered 2012-02-24: 500 [IU]
  Filled 2012-02-24: qty 5

## 2012-02-24 MED ORDER — LORAZEPAM 1 MG PO TABS
0.5000 mg | ORAL_TABLET | Freq: Once | ORAL | Status: AC
  Administered 2012-02-24: 0.5 mg via ORAL

## 2012-02-24 MED ORDER — SODIUM CHLORIDE 0.9 % IJ SOLN
10.0000 mL | INTRAMUSCULAR | Status: AC | PRN
  Administered 2012-02-24: 10 mL
  Filled 2012-02-24: qty 10

## 2012-02-24 NOTE — Patient Instructions (Addendum)
Blood Transfusion Information WHAT IS A BLOOD TRANSFUSION? A transfusion is the replacement of blood or some of its parts. Blood is made up of multiple cells which provide different functions.  Red blood cells carry oxygen and are used for blood loss replacement.  White blood cells fight against infection.  Platelets control bleeding.  Plasma helps clot blood.  Other blood products are available for specialized needs, such as hemophilia or other clotting disorders. BEFORE THE TRANSFUSION  Who gives blood for transfusions?   You may be able to donate blood to be used at a later date on yourself (autologous donation).  Relatives can be asked to donate blood. This is generally not any safer than if you have received blood from a stranger. The same precautions are taken to ensure safety when a relative's blood is donated.  Healthy volunteers who are fully evaluated to make sure their blood is safe. This is blood bank blood. Transfusion therapy is the safest it has ever been in the practice of medicine. Before blood is taken from a donor, a complete history is taken to make sure that person has no history of diseases nor engages in risky social behavior (examples are intravenous drug use or sexual activity with multiple partners). The donor's travel history is screened to minimize risk of transmitting infections, such as malaria. The donated blood is tested for signs of infectious diseases, such as HIV and hepatitis. The blood is then tested to be sure it is compatible with you in order to minimize the chance of a transfusion reaction. If you or a relative donates blood, this is often done in anticipation of surgery and is not appropriate for emergency situations. It takes many days to process the donated blood. RISKS AND COMPLICATIONS Although transfusion therapy is very safe and saves many lives, the main dangers of transfusion include:   Getting an infectious disease.  Developing a  transfusion reaction. This is an allergic reaction to something in the blood you were given. Every precaution is taken to prevent this. The decision to have a blood transfusion has been considered carefully by your caregiver before blood is given. Blood is not given unless the benefits outweigh the risks. AFTER THE TRANSFUSION  Right after receiving a blood transfusion, you will usually feel much better and more energetic. This is especially true if your red blood cells have gotten low (anemic). The transfusion raises the level of the red blood cells which carry oxygen, and this usually causes an energy increase.  The nurse administering the transfusion will monitor you carefully for complications. HOME CARE INSTRUCTIONS  No special instructions are needed after a transfusion. You may find your energy is better. Speak with your caregiver about any limitations on activity for underlying diseases you may have. SEEK MEDICAL CARE IF:   Your condition is not improving after your transfusion.  You develop redness or irritation at the intravenous (IV) site. SEEK IMMEDIATE MEDICAL CARE IF:  Any of the following symptoms occur over the next 12 hours:  Shaking chills.  You have a temperature by mouth above 102 F (38.9 C), not controlled by medicine.  Chest, back, or muscle pain.  People around you feel you are not acting correctly or are confused.  Shortness of breath or difficulty breathing.  Dizziness and fainting.  You get a rash or develop hives.  You have a decrease in urine output.  Your urine turns a dark color or changes to pink, red, or brown. Any of the following   symptoms occur over the next 10 days:  You have a temperature by mouth above 102 F (38.9 C), not controlled by medicine.  Shortness of breath.  Weakness after normal activity.  The white part of the eye turns yellow (jaundice).  You have a decrease in the amount of urine or are urinating less often.  Your  urine turns a dark color or changes to pink, red, or brown. Document Released: 01/05/2000 Document Revised: 04/01/2011 Document Reviewed: 08/24/2007 ExitCare Patient Information 2013 [employer].  YOUR EMLA CREAM HAS BEEN CALLED IN TO YOUR [facility]. Lidocaine; Prilocaine cream What is this medicine? LIDOCAINE; PRILOCAINE (LYE doe kane; PRIL oh kane) is a topical anesthetic that causes loss of feeling in the skin and surrounding tissues. It is used to numb the skin before procedures or injections. This medicine may be used for other purposes; ask your health care provider or pharmacist if you have questions. What should I tell my health care provider before I take this medicine? They need to know if you have any of these conditions: -glucose-6-phosphate deficiencies -heart disease -kidney or liver disease -methemoglobinemia -an unusual or allergic reaction to lidocaine, prilocaine, other medicines, foods, dyes, or preservatives -pregnant or trying to get pregnant -breast-feeding How should I use this medicine? This medicine is for external use only on the skin. Do not take by mouth. Follow the directions on the prescription label. Wash hands before and after use. Do not use more or leave in contact with the skin longer than directed. Do not apply to eyes or open wounds. It can cause irritation and blurred or temporary loss of vision. If this medicine comes in contact with your eyes, immediately rinse the eye with water. Do not touch or rub the eye. Contact your health care provider right away. Talk to your pediatrician regarding the use of this medicine in children. While this medicine may be prescribed for children for selected conditions, precautions do apply. Overdosage: If you think you have taken too much of this medicine contact a poison control center or emergency room at once. NOTE: This medicine is only for you. Do not share this medicine with others. What if I miss a  dose? This medicine is usually only applied once prior to each procedure. It must be in contact with the skin for a period of time for it to work. If you applied this medicine later than directed, tell your health care professional before starting the procedure. What may interact with this medicine? -acetaminophen -chloroquine -dapsone -medicines to control heart rhythm -nitrates like nitroglycerin and nitroprusside -other ointments, creams, or sprays that may contain anesthetic medicine -phenobarbital -phenytoin -quinine -sulfonamides like sulfacetamide, sulfamethoxazole, sulfasalazine and others This list may not describe all possible interactions. Give your health care provider a list of all the medicines, herbs, non-prescription drugs, or dietary supplements you use. Also tell them if you smoke, drink alcohol, or use illegal drugs. Some items may interact with your medicine. What should I watch for while using this medicine? Be careful to avoid injury to the treated area while it is numb and you are not aware of pain. Avoid scratching, rubbing, or exposing the treated area to hot or cold temperatures until complete sensation has returned. The numb feeling will wear off a few hours after applying the cream. What side effects may I notice from receiving this medicine? Side effects that you should report to your doctor or health care professional as soon as possible: -blurred vision -chest pain -difficulty  breathing -dizziness -drowsiness -fast or irregular heartbeat -skin rash or itching -swelling of your throat, lips, or face -trembling Side effects that usually do not require medical attention (report to your doctor or health care professional if they continue or are bothersome): -changes in ability to feel hot or cold -redness and swelling at the application site This list may not describe all possible side effects. Call your doctor for medical advice about side effects. You may  report side effects to FDA at 1-800-FDA-1088. Where should I keep my medicine? Keep out of reach of children. Store at room temperature between 15 and 30 degrees C (59 and 86 degrees F). Keep container tightly closed. Throw away any unused medicine after the expiration date. NOTE: This sheet is a summary. It may not cover all possible information. If you have questions about this medicine, talk to your doctor, pharmacist, or health care provider.  2013, Elsevier/[employer] Standard. (07/13/2007 5:14:35 PM)

## 2012-02-24 NOTE — Telephone Encounter (Signed)
Spoke with pt in lobby. She requests blood transfusion. Scheduled for treatment at [facility] 02/25/12. Worked in for blood in the infusion room today. Pt has newly placed port a cath. Has questions related to port care. Infusion RN to reinforce teaching.

## 2012-02-24 NOTE — Progress Notes (Signed)
@  1400-Reports feeling unsettled feeling in her stomach-thinking it is "nerves". Takes Xanax at home. Requests med. Per Dr. Truett Perna, OK for Ativan 0.5 mg tablet. Patient did just have a loose stool (still has form to it). @1545 -Reports having another stool with more liquid consistency and abdomen is cramping slightly. Per Dr. Truett Perna, OK to give Lomotil #2 tabs. @1715 -Reports abdomen is feeling better.

## 2012-02-25 ENCOUNTER — Telehealth: Payer: Self-pay | Admitting: *Deleted

## 2012-02-25 LAB — TYPE AND SCREEN
Antibody Screen: POSITIVE
DAT, IgG: NEGATIVE
Donor AG Type: NEGATIVE

## 2012-02-25 NOTE — Telephone Encounter (Signed)
Patient is at [facility] at this time, being evaluated for a potential study for MDS, referred by Dr Truett Perna to Dr Lowell Guitar. Robin calling requesting additional records regarding blood transfusions.  Blood transfusion reports from past 8 weeks gathered and faxed to Robin at [phone].

## 2012-03-02 ENCOUNTER — Other Ambulatory Visit: Payer: Medicare Other

## 2012-03-06 ENCOUNTER — Encounter: Payer: Self-pay | Admitting: *Deleted

## 2012-03-09 ENCOUNTER — Ambulatory Visit: Payer: Medicare Other | Admitting: Oncology

## 2012-03-09 ENCOUNTER — Other Ambulatory Visit: Payer: Medicare Other | Admitting: Lab

## 2012-03-10 ENCOUNTER — Telehealth: Payer: Self-pay | Admitting: *Deleted

## 2012-03-10 NOTE — Telephone Encounter (Signed)
Left VM at home # asking patient what labs need to be done for her and when? She had presented earlier today to scheduler for labs, but no orders were in system, so she left.

## 2012-08-26 ENCOUNTER — Other Ambulatory Visit: Payer: Self-pay

## 2012-11-26 ENCOUNTER — Other Ambulatory Visit: Payer: Self-pay

## 2013-01-26 DIAGNOSIS — Z006 Encounter for examination for normal comparison and control in clinical research program: Secondary | ICD-10-CM | POA: Diagnosis not present

## 2013-01-26 DIAGNOSIS — Z5111 Encounter for antineoplastic chemotherapy: Secondary | ICD-10-CM | POA: Diagnosis not present

## 2013-01-26 DIAGNOSIS — D469 Myelodysplastic syndrome, unspecified: Secondary | ICD-10-CM | POA: Diagnosis not present

## 2013-01-29 DIAGNOSIS — D469 Myelodysplastic syndrome, unspecified: Secondary | ICD-10-CM | POA: Diagnosis not present

## 2013-01-29 DIAGNOSIS — Z5111 Encounter for antineoplastic chemotherapy: Secondary | ICD-10-CM | POA: Diagnosis not present

## 2013-01-29 DIAGNOSIS — Z006 Encounter for examination for normal comparison and control in clinical research program: Secondary | ICD-10-CM | POA: Diagnosis not present

## 2013-02-02 DIAGNOSIS — D469 Myelodysplastic syndrome, unspecified: Secondary | ICD-10-CM | POA: Diagnosis not present

## 2013-02-02 DIAGNOSIS — Z5111 Encounter for antineoplastic chemotherapy: Secondary | ICD-10-CM | POA: Diagnosis not present

## 2013-02-02 DIAGNOSIS — D696 Thrombocytopenia, unspecified: Secondary | ICD-10-CM | POA: Diagnosis not present

## 2013-02-02 DIAGNOSIS — Z006 Encounter for examination for normal comparison and control in clinical research program: Secondary | ICD-10-CM | POA: Diagnosis not present

## 2013-02-02 DIAGNOSIS — R894 Abnormal immunological findings in specimens from other organs, systems and tissues: Secondary | ICD-10-CM | POA: Diagnosis not present

## 2013-02-02 DIAGNOSIS — D649 Anemia, unspecified: Secondary | ICD-10-CM | POA: Diagnosis not present

## 2013-02-03 DIAGNOSIS — H524 Presbyopia: Secondary | ICD-10-CM | POA: Diagnosis not present

## 2013-02-03 DIAGNOSIS — H04129 Dry eye syndrome of unspecified lacrimal gland: Secondary | ICD-10-CM | POA: Diagnosis not present

## 2013-02-03 DIAGNOSIS — H43819 Vitreous degeneration, unspecified eye: Secondary | ICD-10-CM | POA: Diagnosis not present

## 2013-02-03 DIAGNOSIS — H02139 Senile ectropion of unspecified eye, unspecified eyelid: Secondary | ICD-10-CM | POA: Diagnosis not present

## 2013-02-03 DIAGNOSIS — H40019 Open angle with borderline findings, low risk, unspecified eye: Secondary | ICD-10-CM | POA: Diagnosis not present

## 2013-02-05 DIAGNOSIS — D469 Myelodysplastic syndrome, unspecified: Secondary | ICD-10-CM | POA: Diagnosis not present

## 2013-02-05 DIAGNOSIS — Z006 Encounter for examination for normal comparison and control in clinical research program: Secondary | ICD-10-CM | POA: Diagnosis not present

## 2013-02-05 DIAGNOSIS — Z5111 Encounter for antineoplastic chemotherapy: Secondary | ICD-10-CM | POA: Diagnosis not present

## 2013-02-09 DIAGNOSIS — D469 Myelodysplastic syndrome, unspecified: Secondary | ICD-10-CM | POA: Diagnosis not present

## 2013-02-09 DIAGNOSIS — Z006 Encounter for examination for normal comparison and control in clinical research program: Secondary | ICD-10-CM | POA: Diagnosis not present

## 2013-02-09 DIAGNOSIS — Z5111 Encounter for antineoplastic chemotherapy: Secondary | ICD-10-CM | POA: Diagnosis not present

## 2013-02-12 DIAGNOSIS — Z5111 Encounter for antineoplastic chemotherapy: Secondary | ICD-10-CM | POA: Diagnosis not present

## 2013-02-12 DIAGNOSIS — Z006 Encounter for examination for normal comparison and control in clinical research program: Secondary | ICD-10-CM | POA: Diagnosis not present

## 2013-02-12 DIAGNOSIS — D469 Myelodysplastic syndrome, unspecified: Secondary | ICD-10-CM | POA: Diagnosis not present

## 2013-02-16 DIAGNOSIS — R894 Abnormal immunological findings in specimens from other organs, systems and tissues: Secondary | ICD-10-CM | POA: Diagnosis not present

## 2013-02-16 DIAGNOSIS — D469 Myelodysplastic syndrome, unspecified: Secondary | ICD-10-CM | POA: Diagnosis not present

## 2013-02-16 DIAGNOSIS — Z006 Encounter for examination for normal comparison and control in clinical research program: Secondary | ICD-10-CM | POA: Diagnosis not present

## 2013-02-16 DIAGNOSIS — D696 Thrombocytopenia, unspecified: Secondary | ICD-10-CM | POA: Diagnosis not present

## 2013-02-16 DIAGNOSIS — D649 Anemia, unspecified: Secondary | ICD-10-CM | POA: Diagnosis not present

## 2013-02-23 DIAGNOSIS — Z5111 Encounter for antineoplastic chemotherapy: Secondary | ICD-10-CM | POA: Diagnosis not present

## 2013-02-23 DIAGNOSIS — Z006 Encounter for examination for normal comparison and control in clinical research program: Secondary | ICD-10-CM | POA: Diagnosis not present

## 2013-02-23 DIAGNOSIS — D469 Myelodysplastic syndrome, unspecified: Secondary | ICD-10-CM | POA: Diagnosis not present

## 2013-02-26 DIAGNOSIS — Z006 Encounter for examination for normal comparison and control in clinical research program: Secondary | ICD-10-CM | POA: Diagnosis not present

## 2013-02-26 DIAGNOSIS — Z5111 Encounter for antineoplastic chemotherapy: Secondary | ICD-10-CM | POA: Diagnosis not present

## 2013-02-26 DIAGNOSIS — D469 Myelodysplastic syndrome, unspecified: Secondary | ICD-10-CM | POA: Diagnosis not present

## 2013-03-02 DIAGNOSIS — Z5111 Encounter for antineoplastic chemotherapy: Secondary | ICD-10-CM | POA: Diagnosis not present

## 2013-03-02 DIAGNOSIS — Z006 Encounter for examination for normal comparison and control in clinical research program: Secondary | ICD-10-CM | POA: Diagnosis not present

## 2013-03-02 DIAGNOSIS — D469 Myelodysplastic syndrome, unspecified: Secondary | ICD-10-CM | POA: Diagnosis not present

## 2013-03-05 DIAGNOSIS — Z006 Encounter for examination for normal comparison and control in clinical research program: Secondary | ICD-10-CM | POA: Diagnosis not present

## 2013-03-05 DIAGNOSIS — D469 Myelodysplastic syndrome, unspecified: Secondary | ICD-10-CM | POA: Diagnosis not present

## 2013-03-05 DIAGNOSIS — R894 Abnormal immunological findings in specimens from other organs, systems and tissues: Secondary | ICD-10-CM | POA: Diagnosis not present

## 2013-03-05 DIAGNOSIS — D696 Thrombocytopenia, unspecified: Secondary | ICD-10-CM | POA: Diagnosis not present

## 2013-03-05 DIAGNOSIS — D649 Anemia, unspecified: Secondary | ICD-10-CM | POA: Diagnosis not present

## 2013-03-05 DIAGNOSIS — Z5111 Encounter for antineoplastic chemotherapy: Secondary | ICD-10-CM | POA: Diagnosis not present

## 2013-03-10 DIAGNOSIS — Z5111 Encounter for antineoplastic chemotherapy: Secondary | ICD-10-CM | POA: Diagnosis not present

## 2013-03-10 DIAGNOSIS — D469 Myelodysplastic syndrome, unspecified: Secondary | ICD-10-CM | POA: Diagnosis not present

## 2013-03-10 DIAGNOSIS — Z006 Encounter for examination for normal comparison and control in clinical research program: Secondary | ICD-10-CM | POA: Diagnosis not present

## 2013-03-12 DIAGNOSIS — D469 Myelodysplastic syndrome, unspecified: Secondary | ICD-10-CM | POA: Diagnosis not present

## 2013-03-12 DIAGNOSIS — Z006 Encounter for examination for normal comparison and control in clinical research program: Secondary | ICD-10-CM | POA: Diagnosis not present

## 2013-03-12 DIAGNOSIS — Z5111 Encounter for antineoplastic chemotherapy: Secondary | ICD-10-CM | POA: Diagnosis not present

## 2013-03-16 DIAGNOSIS — D469 Myelodysplastic syndrome, unspecified: Secondary | ICD-10-CM | POA: Diagnosis not present

## 2013-03-23 DIAGNOSIS — Z5111 Encounter for antineoplastic chemotherapy: Secondary | ICD-10-CM | POA: Diagnosis not present

## 2013-03-23 DIAGNOSIS — Z853 Personal history of malignant neoplasm of breast: Secondary | ICD-10-CM | POA: Diagnosis not present

## 2013-03-23 DIAGNOSIS — D696 Thrombocytopenia, unspecified: Secondary | ICD-10-CM | POA: Diagnosis not present

## 2013-03-23 DIAGNOSIS — D649 Anemia, unspecified: Secondary | ICD-10-CM | POA: Diagnosis not present

## 2013-03-23 DIAGNOSIS — K5909 Other constipation: Secondary | ICD-10-CM | POA: Diagnosis not present

## 2013-03-23 DIAGNOSIS — D469 Myelodysplastic syndrome, unspecified: Secondary | ICD-10-CM | POA: Diagnosis not present

## 2013-03-23 DIAGNOSIS — R351 Nocturia: Secondary | ICD-10-CM | POA: Diagnosis not present

## 2013-03-23 DIAGNOSIS — R404 Transient alteration of awareness: Secondary | ICD-10-CM | POA: Diagnosis not present

## 2013-03-23 DIAGNOSIS — Z006 Encounter for examination for normal comparison and control in clinical research program: Secondary | ICD-10-CM | POA: Diagnosis not present

## 2013-03-23 DIAGNOSIS — R32 Unspecified urinary incontinence: Secondary | ICD-10-CM | POA: Diagnosis not present

## 2013-03-23 DIAGNOSIS — R197 Diarrhea, unspecified: Secondary | ICD-10-CM | POA: Diagnosis not present

## 2013-03-26 DIAGNOSIS — D696 Thrombocytopenia, unspecified: Secondary | ICD-10-CM | POA: Diagnosis not present

## 2013-03-26 DIAGNOSIS — Z006 Encounter for examination for normal comparison and control in clinical research program: Secondary | ICD-10-CM | POA: Diagnosis not present

## 2013-03-26 DIAGNOSIS — D649 Anemia, unspecified: Secondary | ICD-10-CM | POA: Diagnosis not present

## 2013-03-26 DIAGNOSIS — R894 Abnormal immunological findings in specimens from other organs, systems and tissues: Secondary | ICD-10-CM | POA: Diagnosis not present

## 2013-03-26 DIAGNOSIS — D61810 Antineoplastic chemotherapy induced pancytopenia: Secondary | ICD-10-CM | POA: Diagnosis not present

## 2013-03-26 DIAGNOSIS — D469 Myelodysplastic syndrome, unspecified: Secondary | ICD-10-CM | POA: Diagnosis not present

## 2013-03-26 DIAGNOSIS — Z5111 Encounter for antineoplastic chemotherapy: Secondary | ICD-10-CM | POA: Diagnosis not present

## 2013-03-26 DIAGNOSIS — T451X5A Adverse effect of antineoplastic and immunosuppressive drugs, initial encounter: Secondary | ICD-10-CM | POA: Diagnosis not present

## 2013-03-30 DIAGNOSIS — Z006 Encounter for examination for normal comparison and control in clinical research program: Secondary | ICD-10-CM | POA: Diagnosis not present

## 2013-03-30 DIAGNOSIS — D469 Myelodysplastic syndrome, unspecified: Secondary | ICD-10-CM | POA: Diagnosis not present

## 2013-03-30 DIAGNOSIS — Z5111 Encounter for antineoplastic chemotherapy: Secondary | ICD-10-CM | POA: Diagnosis not present

## 2013-04-02 DIAGNOSIS — Z5111 Encounter for antineoplastic chemotherapy: Secondary | ICD-10-CM | POA: Diagnosis not present

## 2013-04-02 DIAGNOSIS — Z006 Encounter for examination for normal comparison and control in clinical research program: Secondary | ICD-10-CM | POA: Diagnosis not present

## 2013-04-02 DIAGNOSIS — D469 Myelodysplastic syndrome, unspecified: Secondary | ICD-10-CM | POA: Diagnosis not present

## 2013-04-02 DIAGNOSIS — D649 Anemia, unspecified: Secondary | ICD-10-CM | POA: Diagnosis not present

## 2013-04-02 DIAGNOSIS — D696 Thrombocytopenia, unspecified: Secondary | ICD-10-CM | POA: Diagnosis not present

## 2013-04-02 DIAGNOSIS — R894 Abnormal immunological findings in specimens from other organs, systems and tissues: Secondary | ICD-10-CM | POA: Diagnosis not present

## 2013-04-06 DIAGNOSIS — Z006 Encounter for examination for normal comparison and control in clinical research program: Secondary | ICD-10-CM | POA: Diagnosis not present

## 2013-04-06 DIAGNOSIS — Z5111 Encounter for antineoplastic chemotherapy: Secondary | ICD-10-CM | POA: Diagnosis not present

## 2013-04-06 DIAGNOSIS — D469 Myelodysplastic syndrome, unspecified: Secondary | ICD-10-CM | POA: Diagnosis not present

## 2013-04-09 DIAGNOSIS — Z006 Encounter for examination for normal comparison and control in clinical research program: Secondary | ICD-10-CM | POA: Diagnosis not present

## 2013-04-09 DIAGNOSIS — R894 Abnormal immunological findings in specimens from other organs, systems and tissues: Secondary | ICD-10-CM | POA: Diagnosis not present

## 2013-04-09 DIAGNOSIS — D469 Myelodysplastic syndrome, unspecified: Secondary | ICD-10-CM | POA: Diagnosis not present

## 2013-04-09 DIAGNOSIS — D696 Thrombocytopenia, unspecified: Secondary | ICD-10-CM | POA: Diagnosis not present

## 2013-04-09 DIAGNOSIS — D649 Anemia, unspecified: Secondary | ICD-10-CM | POA: Diagnosis not present

## 2013-04-09 DIAGNOSIS — Z5111 Encounter for antineoplastic chemotherapy: Secondary | ICD-10-CM | POA: Diagnosis not present

## 2013-04-13 DIAGNOSIS — R32 Unspecified urinary incontinence: Secondary | ICD-10-CM | POA: Diagnosis not present

## 2013-04-13 DIAGNOSIS — R351 Nocturia: Secondary | ICD-10-CM | POA: Diagnosis not present

## 2013-04-13 DIAGNOSIS — D469 Myelodysplastic syndrome, unspecified: Secondary | ICD-10-CM | POA: Diagnosis not present

## 2013-04-13 DIAGNOSIS — D4620 Refractory anemia with excess of blasts, unspecified: Secondary | ICD-10-CM | POA: Diagnosis not present

## 2013-04-13 DIAGNOSIS — D61818 Other pancytopenia: Secondary | ICD-10-CM | POA: Diagnosis not present

## 2013-04-13 DIAGNOSIS — K5900 Constipation, unspecified: Secondary | ICD-10-CM | POA: Diagnosis not present

## 2013-04-13 DIAGNOSIS — R197 Diarrhea, unspecified: Secondary | ICD-10-CM | POA: Diagnosis not present

## 2013-04-20 DIAGNOSIS — D696 Thrombocytopenia, unspecified: Secondary | ICD-10-CM | POA: Diagnosis not present

## 2013-04-20 DIAGNOSIS — R351 Nocturia: Secondary | ICD-10-CM | POA: Diagnosis not present

## 2013-04-20 DIAGNOSIS — Z79899 Other long term (current) drug therapy: Secondary | ICD-10-CM | POA: Diagnosis not present

## 2013-04-20 DIAGNOSIS — D649 Anemia, unspecified: Secondary | ICD-10-CM | POA: Diagnosis not present

## 2013-04-20 DIAGNOSIS — D469 Myelodysplastic syndrome, unspecified: Secondary | ICD-10-CM | POA: Diagnosis not present

## 2013-04-20 DIAGNOSIS — Z853 Personal history of malignant neoplasm of breast: Secondary | ICD-10-CM | POA: Diagnosis not present

## 2013-04-20 DIAGNOSIS — Z923 Personal history of irradiation: Secondary | ICD-10-CM | POA: Diagnosis not present

## 2013-04-20 DIAGNOSIS — Z5111 Encounter for antineoplastic chemotherapy: Secondary | ICD-10-CM | POA: Diagnosis not present

## 2013-04-20 DIAGNOSIS — Z856 Personal history of leukemia: Secondary | ICD-10-CM | POA: Diagnosis not present

## 2013-04-20 DIAGNOSIS — R32 Unspecified urinary incontinence: Secondary | ICD-10-CM | POA: Diagnosis not present

## 2013-04-20 DIAGNOSIS — Z006 Encounter for examination for normal comparison and control in clinical research program: Secondary | ICD-10-CM | POA: Diagnosis not present

## 2013-04-20 DIAGNOSIS — R894 Abnormal immunological findings in specimens from other organs, systems and tissues: Secondary | ICD-10-CM | POA: Diagnosis not present

## 2013-04-23 DIAGNOSIS — D469 Myelodysplastic syndrome, unspecified: Secondary | ICD-10-CM | POA: Diagnosis not present

## 2013-04-23 DIAGNOSIS — Z5111 Encounter for antineoplastic chemotherapy: Secondary | ICD-10-CM | POA: Diagnosis not present

## 2013-04-23 DIAGNOSIS — Z006 Encounter for examination for normal comparison and control in clinical research program: Secondary | ICD-10-CM | POA: Diagnosis not present

## 2013-04-27 DIAGNOSIS — Z5111 Encounter for antineoplastic chemotherapy: Secondary | ICD-10-CM | POA: Diagnosis not present

## 2013-04-27 DIAGNOSIS — Z006 Encounter for examination for normal comparison and control in clinical research program: Secondary | ICD-10-CM | POA: Diagnosis not present

## 2013-04-27 DIAGNOSIS — D469 Myelodysplastic syndrome, unspecified: Secondary | ICD-10-CM | POA: Diagnosis not present

## 2013-04-30 DIAGNOSIS — D469 Myelodysplastic syndrome, unspecified: Secondary | ICD-10-CM | POA: Diagnosis not present

## 2013-04-30 DIAGNOSIS — Z5111 Encounter for antineoplastic chemotherapy: Secondary | ICD-10-CM | POA: Diagnosis not present

## 2013-04-30 DIAGNOSIS — Z006 Encounter for examination for normal comparison and control in clinical research program: Secondary | ICD-10-CM | POA: Diagnosis not present

## 2013-05-04 DIAGNOSIS — D649 Anemia, unspecified: Secondary | ICD-10-CM | POA: Diagnosis not present

## 2013-05-04 DIAGNOSIS — D619 Aplastic anemia, unspecified: Secondary | ICD-10-CM | POA: Diagnosis not present

## 2013-05-04 DIAGNOSIS — D696 Thrombocytopenia, unspecified: Secondary | ICD-10-CM | POA: Diagnosis not present

## 2013-05-04 DIAGNOSIS — D469 Myelodysplastic syndrome, unspecified: Secondary | ICD-10-CM | POA: Diagnosis not present

## 2013-05-04 DIAGNOSIS — Z006 Encounter for examination for normal comparison and control in clinical research program: Secondary | ICD-10-CM | POA: Diagnosis not present

## 2013-05-07 DIAGNOSIS — D469 Myelodysplastic syndrome, unspecified: Secondary | ICD-10-CM | POA: Diagnosis not present

## 2013-05-07 DIAGNOSIS — Z006 Encounter for examination for normal comparison and control in clinical research program: Secondary | ICD-10-CM | POA: Diagnosis not present

## 2013-05-07 DIAGNOSIS — Z5111 Encounter for antineoplastic chemotherapy: Secondary | ICD-10-CM | POA: Diagnosis not present

## 2013-05-12 ENCOUNTER — Other Ambulatory Visit: Payer: Self-pay | Admitting: Oncology

## 2013-05-18 DIAGNOSIS — Z5111 Encounter for antineoplastic chemotherapy: Secondary | ICD-10-CM | POA: Diagnosis not present

## 2013-05-18 DIAGNOSIS — Z006 Encounter for examination for normal comparison and control in clinical research program: Secondary | ICD-10-CM | POA: Diagnosis not present

## 2013-05-18 DIAGNOSIS — D469 Myelodysplastic syndrome, unspecified: Secondary | ICD-10-CM | POA: Diagnosis not present

## 2013-05-21 DIAGNOSIS — Z006 Encounter for examination for normal comparison and control in clinical research program: Secondary | ICD-10-CM | POA: Diagnosis not present

## 2013-05-21 DIAGNOSIS — Z5111 Encounter for antineoplastic chemotherapy: Secondary | ICD-10-CM | POA: Diagnosis not present

## 2013-05-21 DIAGNOSIS — D469 Myelodysplastic syndrome, unspecified: Secondary | ICD-10-CM | POA: Diagnosis not present

## 2013-05-25 DIAGNOSIS — Z006 Encounter for examination for normal comparison and control in clinical research program: Secondary | ICD-10-CM | POA: Diagnosis not present

## 2013-05-25 DIAGNOSIS — D6481 Anemia due to antineoplastic chemotherapy: Secondary | ICD-10-CM | POA: Diagnosis not present

## 2013-05-25 DIAGNOSIS — D649 Anemia, unspecified: Secondary | ICD-10-CM | POA: Diagnosis not present

## 2013-05-25 DIAGNOSIS — Z5111 Encounter for antineoplastic chemotherapy: Secondary | ICD-10-CM | POA: Diagnosis not present

## 2013-05-25 DIAGNOSIS — D469 Myelodysplastic syndrome, unspecified: Secondary | ICD-10-CM | POA: Diagnosis not present

## 2013-05-25 DIAGNOSIS — D696 Thrombocytopenia, unspecified: Secondary | ICD-10-CM | POA: Diagnosis not present

## 2013-05-28 DIAGNOSIS — Z006 Encounter for examination for normal comparison and control in clinical research program: Secondary | ICD-10-CM | POA: Diagnosis not present

## 2013-05-28 DIAGNOSIS — Z5111 Encounter for antineoplastic chemotherapy: Secondary | ICD-10-CM | POA: Diagnosis not present

## 2013-05-28 DIAGNOSIS — D469 Myelodysplastic syndrome, unspecified: Secondary | ICD-10-CM | POA: Diagnosis not present

## 2013-06-01 DIAGNOSIS — D649 Anemia, unspecified: Secondary | ICD-10-CM | POA: Diagnosis not present

## 2013-06-01 DIAGNOSIS — D696 Thrombocytopenia, unspecified: Secondary | ICD-10-CM | POA: Diagnosis not present

## 2013-06-01 DIAGNOSIS — Z5111 Encounter for antineoplastic chemotherapy: Secondary | ICD-10-CM | POA: Diagnosis not present

## 2013-06-01 DIAGNOSIS — Z006 Encounter for examination for normal comparison and control in clinical research program: Secondary | ICD-10-CM | POA: Diagnosis not present

## 2013-06-01 DIAGNOSIS — D469 Myelodysplastic syndrome, unspecified: Secondary | ICD-10-CM | POA: Diagnosis not present

## 2013-06-04 DIAGNOSIS — Z5111 Encounter for antineoplastic chemotherapy: Secondary | ICD-10-CM | POA: Diagnosis not present

## 2013-06-04 DIAGNOSIS — D696 Thrombocytopenia, unspecified: Secondary | ICD-10-CM | POA: Diagnosis not present

## 2013-06-04 DIAGNOSIS — D508 Other iron deficiency anemias: Secondary | ICD-10-CM | POA: Diagnosis not present

## 2013-06-04 DIAGNOSIS — Z006 Encounter for examination for normal comparison and control in clinical research program: Secondary | ICD-10-CM | POA: Diagnosis not present

## 2013-06-04 DIAGNOSIS — D469 Myelodysplastic syndrome, unspecified: Secondary | ICD-10-CM | POA: Diagnosis not present

## 2013-06-07 DIAGNOSIS — H40019 Open angle with borderline findings, low risk, unspecified eye: Secondary | ICD-10-CM | POA: Diagnosis not present

## 2013-06-22 DIAGNOSIS — Z5111 Encounter for antineoplastic chemotherapy: Secondary | ICD-10-CM | POA: Diagnosis not present

## 2013-06-22 DIAGNOSIS — D696 Thrombocytopenia, unspecified: Secondary | ICD-10-CM | POA: Diagnosis not present

## 2013-06-22 DIAGNOSIS — D649 Anemia, unspecified: Secondary | ICD-10-CM | POA: Diagnosis not present

## 2013-06-22 DIAGNOSIS — D469 Myelodysplastic syndrome, unspecified: Secondary | ICD-10-CM | POA: Diagnosis not present

## 2013-06-22 DIAGNOSIS — Z006 Encounter for examination for normal comparison and control in clinical research program: Secondary | ICD-10-CM | POA: Diagnosis not present

## 2013-06-25 DIAGNOSIS — Z5111 Encounter for antineoplastic chemotherapy: Secondary | ICD-10-CM | POA: Diagnosis not present

## 2013-06-25 DIAGNOSIS — Z006 Encounter for examination for normal comparison and control in clinical research program: Secondary | ICD-10-CM | POA: Diagnosis not present

## 2013-06-25 DIAGNOSIS — D469 Myelodysplastic syndrome, unspecified: Secondary | ICD-10-CM | POA: Diagnosis not present

## 2013-06-29 DIAGNOSIS — D649 Anemia, unspecified: Secondary | ICD-10-CM | POA: Diagnosis not present

## 2013-06-29 DIAGNOSIS — Z5111 Encounter for antineoplastic chemotherapy: Secondary | ICD-10-CM | POA: Diagnosis not present

## 2013-06-29 DIAGNOSIS — Z006 Encounter for examination for normal comparison and control in clinical research program: Secondary | ICD-10-CM | POA: Diagnosis not present

## 2013-06-29 DIAGNOSIS — D469 Myelodysplastic syndrome, unspecified: Secondary | ICD-10-CM | POA: Diagnosis not present

## 2013-06-29 DIAGNOSIS — D696 Thrombocytopenia, unspecified: Secondary | ICD-10-CM | POA: Diagnosis not present

## 2013-07-02 DIAGNOSIS — Z5111 Encounter for antineoplastic chemotherapy: Secondary | ICD-10-CM | POA: Diagnosis not present

## 2013-07-02 DIAGNOSIS — D469 Myelodysplastic syndrome, unspecified: Secondary | ICD-10-CM | POA: Diagnosis not present

## 2013-07-02 DIAGNOSIS — Z006 Encounter for examination for normal comparison and control in clinical research program: Secondary | ICD-10-CM | POA: Diagnosis not present

## 2013-07-06 DIAGNOSIS — R32 Unspecified urinary incontinence: Secondary | ICD-10-CM | POA: Diagnosis not present

## 2013-07-06 DIAGNOSIS — D469 Myelodysplastic syndrome, unspecified: Secondary | ICD-10-CM | POA: Diagnosis not present

## 2013-07-06 DIAGNOSIS — K5900 Constipation, unspecified: Secondary | ICD-10-CM | POA: Diagnosis not present

## 2013-07-06 DIAGNOSIS — T451X5A Adverse effect of antineoplastic and immunosuppressive drugs, initial encounter: Secondary | ICD-10-CM | POA: Diagnosis not present

## 2013-07-06 DIAGNOSIS — R197 Diarrhea, unspecified: Secondary | ICD-10-CM | POA: Diagnosis not present

## 2013-07-06 DIAGNOSIS — D61810 Antineoplastic chemotherapy induced pancytopenia: Secondary | ICD-10-CM | POA: Diagnosis not present

## 2013-07-06 DIAGNOSIS — Z006 Encounter for examination for normal comparison and control in clinical research program: Secondary | ICD-10-CM | POA: Diagnosis not present

## 2013-07-06 DIAGNOSIS — Z5111 Encounter for antineoplastic chemotherapy: Secondary | ICD-10-CM | POA: Diagnosis not present

## 2013-07-09 DIAGNOSIS — D696 Thrombocytopenia, unspecified: Secondary | ICD-10-CM | POA: Diagnosis not present

## 2013-07-09 DIAGNOSIS — D649 Anemia, unspecified: Secondary | ICD-10-CM | POA: Diagnosis not present

## 2013-07-09 DIAGNOSIS — D469 Myelodysplastic syndrome, unspecified: Secondary | ICD-10-CM | POA: Diagnosis not present

## 2013-07-09 DIAGNOSIS — Z5111 Encounter for antineoplastic chemotherapy: Secondary | ICD-10-CM | POA: Diagnosis not present

## 2013-07-09 DIAGNOSIS — Z006 Encounter for examination for normal comparison and control in clinical research program: Secondary | ICD-10-CM | POA: Diagnosis not present

## 2013-07-13 DIAGNOSIS — C9110 Chronic lymphocytic leukemia of B-cell type not having achieved remission: Secondary | ICD-10-CM | POA: Diagnosis not present

## 2013-07-13 DIAGNOSIS — D61818 Other pancytopenia: Secondary | ICD-10-CM | POA: Diagnosis not present

## 2013-07-13 DIAGNOSIS — D469 Myelodysplastic syndrome, unspecified: Secondary | ICD-10-CM | POA: Diagnosis not present

## 2013-07-19 DIAGNOSIS — Z5111 Encounter for antineoplastic chemotherapy: Secondary | ICD-10-CM | POA: Diagnosis not present

## 2013-07-19 DIAGNOSIS — D630 Anemia in neoplastic disease: Secondary | ICD-10-CM | POA: Diagnosis not present

## 2013-07-19 DIAGNOSIS — D61818 Other pancytopenia: Secondary | ICD-10-CM | POA: Diagnosis not present

## 2013-07-19 DIAGNOSIS — Z006 Encounter for examination for normal comparison and control in clinical research program: Secondary | ICD-10-CM | POA: Diagnosis not present

## 2013-07-19 DIAGNOSIS — R351 Nocturia: Secondary | ICD-10-CM | POA: Diagnosis not present

## 2013-07-19 DIAGNOSIS — D469 Myelodysplastic syndrome, unspecified: Secondary | ICD-10-CM | POA: Diagnosis not present

## 2013-07-19 DIAGNOSIS — D696 Thrombocytopenia, unspecified: Secondary | ICD-10-CM | POA: Diagnosis not present

## 2013-07-22 DIAGNOSIS — Z5111 Encounter for antineoplastic chemotherapy: Secondary | ICD-10-CM | POA: Diagnosis not present

## 2013-07-22 DIAGNOSIS — Z006 Encounter for examination for normal comparison and control in clinical research program: Secondary | ICD-10-CM | POA: Diagnosis not present

## 2013-07-22 DIAGNOSIS — D469 Myelodysplastic syndrome, unspecified: Secondary | ICD-10-CM | POA: Diagnosis not present

## 2013-07-27 DIAGNOSIS — T451X5A Adverse effect of antineoplastic and immunosuppressive drugs, initial encounter: Secondary | ICD-10-CM | POA: Diagnosis not present

## 2013-07-27 DIAGNOSIS — D61810 Antineoplastic chemotherapy induced pancytopenia: Secondary | ICD-10-CM | POA: Diagnosis not present

## 2013-07-27 DIAGNOSIS — Z5111 Encounter for antineoplastic chemotherapy: Secondary | ICD-10-CM | POA: Diagnosis not present

## 2013-07-27 DIAGNOSIS — D469 Myelodysplastic syndrome, unspecified: Secondary | ICD-10-CM | POA: Diagnosis not present

## 2013-07-27 DIAGNOSIS — Z006 Encounter for examination for normal comparison and control in clinical research program: Secondary | ICD-10-CM | POA: Diagnosis not present

## 2013-07-27 DIAGNOSIS — D696 Thrombocytopenia, unspecified: Secondary | ICD-10-CM | POA: Diagnosis not present

## 2013-07-27 DIAGNOSIS — D649 Anemia, unspecified: Secondary | ICD-10-CM | POA: Diagnosis not present

## 2013-07-30 DIAGNOSIS — Z5111 Encounter for antineoplastic chemotherapy: Secondary | ICD-10-CM | POA: Diagnosis not present

## 2013-07-30 DIAGNOSIS — D649 Anemia, unspecified: Secondary | ICD-10-CM | POA: Diagnosis not present

## 2013-07-30 DIAGNOSIS — D696 Thrombocytopenia, unspecified: Secondary | ICD-10-CM | POA: Diagnosis not present

## 2013-07-30 DIAGNOSIS — D469 Myelodysplastic syndrome, unspecified: Secondary | ICD-10-CM | POA: Diagnosis not present

## 2013-07-30 DIAGNOSIS — Z006 Encounter for examination for normal comparison and control in clinical research program: Secondary | ICD-10-CM | POA: Diagnosis not present

## 2013-08-03 DIAGNOSIS — Z006 Encounter for examination for normal comparison and control in clinical research program: Secondary | ICD-10-CM | POA: Diagnosis not present

## 2013-08-03 DIAGNOSIS — Z5111 Encounter for antineoplastic chemotherapy: Secondary | ICD-10-CM | POA: Diagnosis not present

## 2013-08-03 DIAGNOSIS — D469 Myelodysplastic syndrome, unspecified: Secondary | ICD-10-CM | POA: Diagnosis not present

## 2013-08-06 DIAGNOSIS — Z006 Encounter for examination for normal comparison and control in clinical research program: Secondary | ICD-10-CM | POA: Diagnosis not present

## 2013-08-06 DIAGNOSIS — D469 Myelodysplastic syndrome, unspecified: Secondary | ICD-10-CM | POA: Diagnosis not present

## 2013-08-06 DIAGNOSIS — Z5111 Encounter for antineoplastic chemotherapy: Secondary | ICD-10-CM | POA: Diagnosis not present

## 2013-08-06 DIAGNOSIS — D649 Anemia, unspecified: Secondary | ICD-10-CM | POA: Diagnosis not present

## 2013-08-06 DIAGNOSIS — D696 Thrombocytopenia, unspecified: Secondary | ICD-10-CM | POA: Diagnosis not present

## 2013-08-17 DIAGNOSIS — R7402 Elevation of levels of lactic acid dehydrogenase (LDH): Secondary | ICD-10-CM | POA: Diagnosis not present

## 2013-08-17 DIAGNOSIS — D469 Myelodysplastic syndrome, unspecified: Secondary | ICD-10-CM | POA: Diagnosis not present

## 2013-08-17 DIAGNOSIS — Z5111 Encounter for antineoplastic chemotherapy: Secondary | ICD-10-CM | POA: Diagnosis not present

## 2013-08-17 DIAGNOSIS — Z9189 Other specified personal risk factors, not elsewhere classified: Secondary | ICD-10-CM | POA: Diagnosis not present

## 2013-08-17 DIAGNOSIS — Z006 Encounter for examination for normal comparison and control in clinical research program: Secondary | ICD-10-CM | POA: Diagnosis not present

## 2013-08-20 DIAGNOSIS — D469 Myelodysplastic syndrome, unspecified: Secondary | ICD-10-CM | POA: Diagnosis not present

## 2013-08-20 DIAGNOSIS — Z006 Encounter for examination for normal comparison and control in clinical research program: Secondary | ICD-10-CM | POA: Diagnosis not present

## 2013-08-20 DIAGNOSIS — D649 Anemia, unspecified: Secondary | ICD-10-CM | POA: Diagnosis not present

## 2013-08-20 DIAGNOSIS — D696 Thrombocytopenia, unspecified: Secondary | ICD-10-CM | POA: Diagnosis not present

## 2013-08-20 DIAGNOSIS — T451X5A Adverse effect of antineoplastic and immunosuppressive drugs, initial encounter: Secondary | ICD-10-CM | POA: Diagnosis not present

## 2013-08-20 DIAGNOSIS — D61810 Antineoplastic chemotherapy induced pancytopenia: Secondary | ICD-10-CM | POA: Diagnosis not present

## 2013-08-24 DIAGNOSIS — D469 Myelodysplastic syndrome, unspecified: Secondary | ICD-10-CM | POA: Diagnosis not present

## 2013-08-24 DIAGNOSIS — Z5111 Encounter for antineoplastic chemotherapy: Secondary | ICD-10-CM | POA: Diagnosis not present

## 2013-08-24 DIAGNOSIS — Z006 Encounter for examination for normal comparison and control in clinical research program: Secondary | ICD-10-CM | POA: Diagnosis not present

## 2013-08-27 DIAGNOSIS — D469 Myelodysplastic syndrome, unspecified: Secondary | ICD-10-CM | POA: Diagnosis not present

## 2013-08-27 DIAGNOSIS — Z006 Encounter for examination for normal comparison and control in clinical research program: Secondary | ICD-10-CM | POA: Diagnosis not present

## 2013-08-31 DIAGNOSIS — R351 Nocturia: Secondary | ICD-10-CM | POA: Diagnosis not present

## 2013-08-31 DIAGNOSIS — R32 Unspecified urinary incontinence: Secondary | ICD-10-CM | POA: Diagnosis not present

## 2013-08-31 DIAGNOSIS — T451X5A Adverse effect of antineoplastic and immunosuppressive drugs, initial encounter: Secondary | ICD-10-CM | POA: Diagnosis not present

## 2013-08-31 DIAGNOSIS — Z006 Encounter for examination for normal comparison and control in clinical research program: Secondary | ICD-10-CM | POA: Diagnosis not present

## 2013-08-31 DIAGNOSIS — R197 Diarrhea, unspecified: Secondary | ICD-10-CM | POA: Diagnosis not present

## 2013-08-31 DIAGNOSIS — R413 Other amnesia: Secondary | ICD-10-CM | POA: Diagnosis not present

## 2013-08-31 DIAGNOSIS — C50919 Malignant neoplasm of unspecified site of unspecified female breast: Secondary | ICD-10-CM | POA: Diagnosis not present

## 2013-08-31 DIAGNOSIS — K5900 Constipation, unspecified: Secondary | ICD-10-CM | POA: Diagnosis not present

## 2013-08-31 DIAGNOSIS — D469 Myelodysplastic syndrome, unspecified: Secondary | ICD-10-CM | POA: Diagnosis not present

## 2013-08-31 DIAGNOSIS — D61810 Antineoplastic chemotherapy induced pancytopenia: Secondary | ICD-10-CM | POA: Diagnosis not present

## 2013-08-31 DIAGNOSIS — K5909 Other constipation: Secondary | ICD-10-CM | POA: Diagnosis not present

## 2013-09-03 DIAGNOSIS — Z006 Encounter for examination for normal comparison and control in clinical research program: Secondary | ICD-10-CM | POA: Diagnosis not present

## 2013-09-03 DIAGNOSIS — Z5111 Encounter for antineoplastic chemotherapy: Secondary | ICD-10-CM | POA: Diagnosis not present

## 2013-09-03 DIAGNOSIS — D469 Myelodysplastic syndrome, unspecified: Secondary | ICD-10-CM | POA: Diagnosis not present

## 2013-09-14 DIAGNOSIS — Z79899 Other long term (current) drug therapy: Secondary | ICD-10-CM | POA: Diagnosis not present

## 2013-09-14 DIAGNOSIS — R351 Nocturia: Secondary | ICD-10-CM | POA: Diagnosis not present

## 2013-09-14 DIAGNOSIS — R32 Unspecified urinary incontinence: Secondary | ICD-10-CM | POA: Diagnosis not present

## 2013-09-14 DIAGNOSIS — E039 Hypothyroidism, unspecified: Secondary | ICD-10-CM | POA: Diagnosis not present

## 2013-09-14 DIAGNOSIS — Z853 Personal history of malignant neoplasm of breast: Secondary | ICD-10-CM | POA: Diagnosis not present

## 2013-09-14 DIAGNOSIS — R269 Unspecified abnormalities of gait and mobility: Secondary | ICD-10-CM | POA: Diagnosis not present

## 2013-09-14 DIAGNOSIS — R748 Abnormal levels of other serum enzymes: Secondary | ICD-10-CM | POA: Diagnosis not present

## 2013-09-14 DIAGNOSIS — Z006 Encounter for examination for normal comparison and control in clinical research program: Secondary | ICD-10-CM | POA: Diagnosis not present

## 2013-09-14 DIAGNOSIS — R413 Other amnesia: Secondary | ICD-10-CM | POA: Diagnosis not present

## 2013-09-14 DIAGNOSIS — D469 Myelodysplastic syndrome, unspecified: Secondary | ICD-10-CM | POA: Diagnosis not present

## 2013-09-14 DIAGNOSIS — Z888 Allergy status to other drugs, medicaments and biological substances status: Secondary | ICD-10-CM | POA: Diagnosis not present

## 2013-09-14 DIAGNOSIS — Z9181 History of falling: Secondary | ICD-10-CM | POA: Diagnosis not present

## 2013-09-14 DIAGNOSIS — C9110 Chronic lymphocytic leukemia of B-cell type not having achieved remission: Secondary | ICD-10-CM | POA: Diagnosis not present

## 2013-09-14 DIAGNOSIS — Z9221 Personal history of antineoplastic chemotherapy: Secondary | ICD-10-CM | POA: Diagnosis not present

## 2013-09-17 DIAGNOSIS — Z006 Encounter for examination for normal comparison and control in clinical research program: Secondary | ICD-10-CM | POA: Diagnosis not present

## 2013-09-17 DIAGNOSIS — D649 Anemia, unspecified: Secondary | ICD-10-CM | POA: Diagnosis not present

## 2013-09-17 DIAGNOSIS — D469 Myelodysplastic syndrome, unspecified: Secondary | ICD-10-CM | POA: Diagnosis not present

## 2013-09-17 DIAGNOSIS — D696 Thrombocytopenia, unspecified: Secondary | ICD-10-CM | POA: Diagnosis not present

## 2013-09-21 DIAGNOSIS — R32 Unspecified urinary incontinence: Secondary | ICD-10-CM | POA: Diagnosis not present

## 2013-09-21 DIAGNOSIS — Z9889 Other specified postprocedural states: Secondary | ICD-10-CM | POA: Diagnosis not present

## 2013-09-21 DIAGNOSIS — D469 Myelodysplastic syndrome, unspecified: Secondary | ICD-10-CM | POA: Diagnosis not present

## 2013-09-21 DIAGNOSIS — D61818 Other pancytopenia: Secondary | ICD-10-CM | POA: Diagnosis not present

## 2013-09-21 DIAGNOSIS — T451X5A Adverse effect of antineoplastic and immunosuppressive drugs, initial encounter: Secondary | ICD-10-CM | POA: Diagnosis not present

## 2013-09-21 DIAGNOSIS — D61810 Antineoplastic chemotherapy induced pancytopenia: Secondary | ICD-10-CM | POA: Diagnosis not present

## 2013-09-21 DIAGNOSIS — Z006 Encounter for examination for normal comparison and control in clinical research program: Secondary | ICD-10-CM | POA: Diagnosis not present

## 2013-09-21 DIAGNOSIS — Z79899 Other long term (current) drug therapy: Secondary | ICD-10-CM | POA: Diagnosis not present

## 2013-09-21 DIAGNOSIS — Z853 Personal history of malignant neoplasm of breast: Secondary | ICD-10-CM | POA: Diagnosis not present

## 2013-09-21 DIAGNOSIS — R351 Nocturia: Secondary | ICD-10-CM | POA: Diagnosis not present

## 2013-09-21 DIAGNOSIS — Z5111 Encounter for antineoplastic chemotherapy: Secondary | ICD-10-CM | POA: Diagnosis not present

## 2013-09-24 DIAGNOSIS — Z5111 Encounter for antineoplastic chemotherapy: Secondary | ICD-10-CM | POA: Diagnosis not present

## 2013-09-24 DIAGNOSIS — D469 Myelodysplastic syndrome, unspecified: Secondary | ICD-10-CM | POA: Diagnosis not present

## 2013-09-24 DIAGNOSIS — Z006 Encounter for examination for normal comparison and control in clinical research program: Secondary | ICD-10-CM | POA: Diagnosis not present

## 2013-09-28 DIAGNOSIS — Z006 Encounter for examination for normal comparison and control in clinical research program: Secondary | ICD-10-CM | POA: Diagnosis not present

## 2013-09-28 DIAGNOSIS — D469 Myelodysplastic syndrome, unspecified: Secondary | ICD-10-CM | POA: Diagnosis not present

## 2013-10-01 DIAGNOSIS — D469 Myelodysplastic syndrome, unspecified: Secondary | ICD-10-CM | POA: Diagnosis not present

## 2013-10-01 DIAGNOSIS — Z006 Encounter for examination for normal comparison and control in clinical research program: Secondary | ICD-10-CM | POA: Diagnosis not present

## 2013-10-01 DIAGNOSIS — R3915 Urgency of urination: Secondary | ICD-10-CM | POA: Diagnosis not present

## 2013-10-05 DIAGNOSIS — D469 Myelodysplastic syndrome, unspecified: Secondary | ICD-10-CM | POA: Diagnosis not present

## 2013-10-05 DIAGNOSIS — D4620 Refractory anemia with excess of blasts, unspecified: Secondary | ICD-10-CM | POA: Diagnosis not present

## 2013-10-12 DIAGNOSIS — Z853 Personal history of malignant neoplasm of breast: Secondary | ICD-10-CM | POA: Diagnosis not present

## 2013-10-12 DIAGNOSIS — K5900 Constipation, unspecified: Secondary | ICD-10-CM | POA: Diagnosis not present

## 2013-10-12 DIAGNOSIS — R269 Unspecified abnormalities of gait and mobility: Secondary | ICD-10-CM | POA: Diagnosis not present

## 2013-10-12 DIAGNOSIS — Z006 Encounter for examination for normal comparison and control in clinical research program: Secondary | ICD-10-CM | POA: Diagnosis not present

## 2013-10-12 DIAGNOSIS — Z5111 Encounter for antineoplastic chemotherapy: Secondary | ICD-10-CM | POA: Diagnosis not present

## 2013-10-12 DIAGNOSIS — Z9181 History of falling: Secondary | ICD-10-CM | POA: Diagnosis not present

## 2013-10-12 DIAGNOSIS — Z923 Personal history of irradiation: Secondary | ICD-10-CM | POA: Diagnosis not present

## 2013-10-12 DIAGNOSIS — E039 Hypothyroidism, unspecified: Secondary | ICD-10-CM | POA: Diagnosis not present

## 2013-10-12 DIAGNOSIS — R351 Nocturia: Secondary | ICD-10-CM | POA: Diagnosis not present

## 2013-10-12 DIAGNOSIS — R413 Other amnesia: Secondary | ICD-10-CM | POA: Diagnosis not present

## 2013-10-12 DIAGNOSIS — D469 Myelodysplastic syndrome, unspecified: Secondary | ICD-10-CM | POA: Diagnosis not present

## 2013-10-12 DIAGNOSIS — Z79899 Other long term (current) drug therapy: Secondary | ICD-10-CM | POA: Diagnosis not present

## 2013-10-12 DIAGNOSIS — R748 Abnormal levels of other serum enzymes: Secondary | ICD-10-CM | POA: Diagnosis not present

## 2013-10-12 DIAGNOSIS — R197 Diarrhea, unspecified: Secondary | ICD-10-CM | POA: Diagnosis not present

## 2013-10-12 DIAGNOSIS — T451X5A Adverse effect of antineoplastic and immunosuppressive drugs, initial encounter: Secondary | ICD-10-CM | POA: Diagnosis not present

## 2013-10-12 DIAGNOSIS — D61810 Antineoplastic chemotherapy induced pancytopenia: Secondary | ICD-10-CM | POA: Diagnosis not present

## 2013-10-12 DIAGNOSIS — C9111 Chronic lymphocytic leukemia of B-cell type in remission: Secondary | ICD-10-CM | POA: Diagnosis not present

## 2013-10-12 DIAGNOSIS — R32 Unspecified urinary incontinence: Secondary | ICD-10-CM | POA: Diagnosis not present

## 2013-10-15 DIAGNOSIS — D469 Myelodysplastic syndrome, unspecified: Secondary | ICD-10-CM | POA: Diagnosis not present

## 2013-10-15 DIAGNOSIS — Z5111 Encounter for antineoplastic chemotherapy: Secondary | ICD-10-CM | POA: Diagnosis not present

## 2013-10-15 DIAGNOSIS — Z006 Encounter for examination for normal comparison and control in clinical research program: Secondary | ICD-10-CM | POA: Diagnosis not present

## 2013-10-19 DIAGNOSIS — K5909 Other constipation: Secondary | ICD-10-CM | POA: Diagnosis not present

## 2013-10-19 DIAGNOSIS — D4620 Refractory anemia with excess of blasts, unspecified: Secondary | ICD-10-CM | POA: Diagnosis not present

## 2013-10-19 DIAGNOSIS — N3090 Cystitis, unspecified without hematuria: Secondary | ICD-10-CM | POA: Diagnosis not present

## 2013-10-19 DIAGNOSIS — R404 Transient alteration of awareness: Secondary | ICD-10-CM | POA: Diagnosis not present

## 2013-10-19 DIAGNOSIS — Z006 Encounter for examination for normal comparison and control in clinical research program: Secondary | ICD-10-CM | POA: Diagnosis not present

## 2013-10-19 DIAGNOSIS — D61810 Antineoplastic chemotherapy induced pancytopenia: Secondary | ICD-10-CM | POA: Diagnosis not present

## 2013-10-19 DIAGNOSIS — D469 Myelodysplastic syndrome, unspecified: Secondary | ICD-10-CM | POA: Diagnosis not present

## 2013-10-19 DIAGNOSIS — Z853 Personal history of malignant neoplasm of breast: Secondary | ICD-10-CM | POA: Diagnosis not present

## 2013-10-19 DIAGNOSIS — T451X5A Adverse effect of antineoplastic and immunosuppressive drugs, initial encounter: Secondary | ICD-10-CM | POA: Diagnosis not present

## 2013-10-19 DIAGNOSIS — R300 Dysuria: Secondary | ICD-10-CM | POA: Diagnosis not present

## 2013-10-19 DIAGNOSIS — R7401 Elevation of levels of liver transaminase levels: Secondary | ICD-10-CM | POA: Diagnosis not present

## 2013-10-19 DIAGNOSIS — N3944 Nocturnal enuresis: Secondary | ICD-10-CM | POA: Diagnosis not present

## 2013-10-19 DIAGNOSIS — R32 Unspecified urinary incontinence: Secondary | ICD-10-CM | POA: Diagnosis not present

## 2013-10-19 DIAGNOSIS — R197 Diarrhea, unspecified: Secondary | ICD-10-CM | POA: Diagnosis not present

## 2013-10-19 DIAGNOSIS — R413 Other amnesia: Secondary | ICD-10-CM | POA: Diagnosis not present

## 2013-10-21 DIAGNOSIS — Z23 Encounter for immunization: Secondary | ICD-10-CM | POA: Diagnosis not present

## 2013-10-22 DIAGNOSIS — Z79899 Other long term (current) drug therapy: Secondary | ICD-10-CM | POA: Diagnosis not present

## 2013-10-22 DIAGNOSIS — Z006 Encounter for examination for normal comparison and control in clinical research program: Secondary | ICD-10-CM | POA: Diagnosis not present

## 2013-10-22 DIAGNOSIS — Z5111 Encounter for antineoplastic chemotherapy: Secondary | ICD-10-CM | POA: Diagnosis not present

## 2013-10-22 DIAGNOSIS — D469 Myelodysplastic syndrome, unspecified: Secondary | ICD-10-CM | POA: Diagnosis not present

## 2013-10-26 DIAGNOSIS — D469 Myelodysplastic syndrome, unspecified: Secondary | ICD-10-CM | POA: Diagnosis not present

## 2013-10-26 DIAGNOSIS — Z79899 Other long term (current) drug therapy: Secondary | ICD-10-CM | POA: Diagnosis not present

## 2013-10-29 DIAGNOSIS — D649 Anemia, unspecified: Secondary | ICD-10-CM | POA: Diagnosis not present

## 2013-10-29 DIAGNOSIS — D469 Myelodysplastic syndrome, unspecified: Secondary | ICD-10-CM | POA: Diagnosis not present

## 2013-10-29 DIAGNOSIS — Z5111 Encounter for antineoplastic chemotherapy: Secondary | ICD-10-CM | POA: Diagnosis not present

## 2013-10-29 DIAGNOSIS — D696 Thrombocytopenia, unspecified: Secondary | ICD-10-CM | POA: Diagnosis not present

## 2013-10-29 DIAGNOSIS — Z79899 Other long term (current) drug therapy: Secondary | ICD-10-CM | POA: Diagnosis not present

## 2013-10-29 DIAGNOSIS — Z006 Encounter for examination for normal comparison and control in clinical research program: Secondary | ICD-10-CM | POA: Diagnosis not present

## 2013-11-09 DIAGNOSIS — T451X5D Adverse effect of antineoplastic and immunosuppressive drugs, subsequent encounter: Secondary | ICD-10-CM | POA: Diagnosis not present

## 2013-11-09 DIAGNOSIS — R945 Abnormal results of liver function studies: Secondary | ICD-10-CM | POA: Diagnosis not present

## 2013-11-09 DIAGNOSIS — Z888 Allergy status to other drugs, medicaments and biological substances status: Secondary | ICD-10-CM | POA: Diagnosis not present

## 2013-11-09 DIAGNOSIS — Z9289 Personal history of other medical treatment: Secondary | ICD-10-CM | POA: Diagnosis not present

## 2013-11-09 DIAGNOSIS — Z9221 Personal history of antineoplastic chemotherapy: Secondary | ICD-10-CM | POA: Diagnosis not present

## 2013-11-09 DIAGNOSIS — E039 Hypothyroidism, unspecified: Secondary | ICD-10-CM | POA: Diagnosis not present

## 2013-11-09 DIAGNOSIS — R197 Diarrhea, unspecified: Secondary | ICD-10-CM | POA: Diagnosis not present

## 2013-11-09 DIAGNOSIS — K5909 Other constipation: Secondary | ICD-10-CM | POA: Diagnosis not present

## 2013-11-09 DIAGNOSIS — D469 Myelodysplastic syndrome, unspecified: Secondary | ICD-10-CM | POA: Diagnosis not present

## 2013-11-09 DIAGNOSIS — Z853 Personal history of malignant neoplasm of breast: Secondary | ICD-10-CM | POA: Diagnosis not present

## 2013-11-09 DIAGNOSIS — Z006 Encounter for examination for normal comparison and control in clinical research program: Secondary | ICD-10-CM | POA: Diagnosis not present

## 2013-11-09 DIAGNOSIS — Z79899 Other long term (current) drug therapy: Secondary | ICD-10-CM | POA: Diagnosis not present

## 2013-11-09 DIAGNOSIS — R413 Other amnesia: Secondary | ICD-10-CM | POA: Diagnosis not present

## 2013-11-09 DIAGNOSIS — R351 Nocturia: Secondary | ICD-10-CM | POA: Diagnosis not present

## 2013-11-09 DIAGNOSIS — Z9181 History of falling: Secondary | ICD-10-CM | POA: Diagnosis not present

## 2013-11-09 DIAGNOSIS — D61810 Antineoplastic chemotherapy induced pancytopenia: Secondary | ICD-10-CM | POA: Diagnosis not present

## 2013-11-09 DIAGNOSIS — R32 Unspecified urinary incontinence: Secondary | ICD-10-CM | POA: Diagnosis not present

## 2013-11-09 DIAGNOSIS — R300 Dysuria: Secondary | ICD-10-CM | POA: Diagnosis not present

## 2013-11-16 DIAGNOSIS — Z79899 Other long term (current) drug therapy: Secondary | ICD-10-CM | POA: Diagnosis not present

## 2013-11-16 DIAGNOSIS — D649 Anemia, unspecified: Secondary | ICD-10-CM | POA: Diagnosis not present

## 2013-11-16 DIAGNOSIS — D696 Thrombocytopenia, unspecified: Secondary | ICD-10-CM | POA: Diagnosis not present

## 2013-11-16 DIAGNOSIS — D469 Myelodysplastic syndrome, unspecified: Secondary | ICD-10-CM | POA: Diagnosis not present

## 2013-11-30 DIAGNOSIS — R740 Nonspecific elevation of levels of transaminase and lactic acid dehydrogenase [LDH]: Secondary | ICD-10-CM | POA: Diagnosis not present

## 2013-11-30 DIAGNOSIS — Z923 Personal history of irradiation: Secondary | ICD-10-CM | POA: Diagnosis not present

## 2013-11-30 DIAGNOSIS — Z853 Personal history of malignant neoplasm of breast: Secondary | ICD-10-CM | POA: Diagnosis not present

## 2013-11-30 DIAGNOSIS — R413 Other amnesia: Secondary | ICD-10-CM | POA: Diagnosis not present

## 2013-11-30 DIAGNOSIS — D61818 Other pancytopenia: Secondary | ICD-10-CM | POA: Diagnosis not present

## 2013-11-30 DIAGNOSIS — D469 Myelodysplastic syndrome, unspecified: Secondary | ICD-10-CM | POA: Diagnosis not present

## 2013-11-30 DIAGNOSIS — R300 Dysuria: Secondary | ICD-10-CM | POA: Diagnosis not present

## 2013-11-30 DIAGNOSIS — R32 Unspecified urinary incontinence: Secondary | ICD-10-CM | POA: Diagnosis not present

## 2013-11-30 DIAGNOSIS — E039 Hypothyroidism, unspecified: Secondary | ICD-10-CM | POA: Diagnosis not present

## 2013-11-30 DIAGNOSIS — Z9221 Personal history of antineoplastic chemotherapy: Secondary | ICD-10-CM | POA: Diagnosis not present

## 2013-11-30 DIAGNOSIS — C9110 Chronic lymphocytic leukemia of B-cell type not having achieved remission: Secondary | ICD-10-CM | POA: Diagnosis not present

## 2013-11-30 DIAGNOSIS — R351 Nocturia: Secondary | ICD-10-CM | POA: Diagnosis not present

## 2013-11-30 DIAGNOSIS — Z79899 Other long term (current) drug therapy: Secondary | ICD-10-CM | POA: Diagnosis not present

## 2013-12-21 DIAGNOSIS — D469 Myelodysplastic syndrome, unspecified: Secondary | ICD-10-CM | POA: Diagnosis not present

## 2013-12-21 DIAGNOSIS — D649 Anemia, unspecified: Secondary | ICD-10-CM | POA: Diagnosis not present

## 2013-12-21 DIAGNOSIS — D696 Thrombocytopenia, unspecified: Secondary | ICD-10-CM | POA: Diagnosis not present

## 2014-01-04 DIAGNOSIS — R32 Unspecified urinary incontinence: Secondary | ICD-10-CM | POA: Diagnosis not present

## 2014-01-04 DIAGNOSIS — R351 Nocturia: Secondary | ICD-10-CM | POA: Diagnosis not present

## 2014-01-04 DIAGNOSIS — D469 Myelodysplastic syndrome, unspecified: Secondary | ICD-10-CM | POA: Diagnosis not present

## 2014-01-04 DIAGNOSIS — D696 Thrombocytopenia, unspecified: Secondary | ICD-10-CM | POA: Diagnosis not present

## 2014-01-04 DIAGNOSIS — R740 Nonspecific elevation of levels of transaminase and lactic acid dehydrogenase [LDH]: Secondary | ICD-10-CM | POA: Diagnosis not present

## 2014-01-04 DIAGNOSIS — R300 Dysuria: Secondary | ICD-10-CM | POA: Diagnosis not present

## 2014-01-04 DIAGNOSIS — D649 Anemia, unspecified: Secondary | ICD-10-CM | POA: Diagnosis not present

## 2014-01-18 DIAGNOSIS — T424X5A Adverse effect of benzodiazepines, initial encounter: Secondary | ICD-10-CM | POA: Diagnosis not present

## 2014-01-18 DIAGNOSIS — R32 Unspecified urinary incontinence: Secondary | ICD-10-CM | POA: Diagnosis not present

## 2014-01-18 DIAGNOSIS — D696 Thrombocytopenia, unspecified: Secondary | ICD-10-CM | POA: Diagnosis not present

## 2014-01-18 DIAGNOSIS — E039 Hypothyroidism, unspecified: Secondary | ICD-10-CM | POA: Diagnosis not present

## 2014-01-18 DIAGNOSIS — R300 Dysuria: Secondary | ICD-10-CM | POA: Diagnosis not present

## 2014-01-18 DIAGNOSIS — D61818 Other pancytopenia: Secondary | ICD-10-CM | POA: Diagnosis not present

## 2014-01-18 DIAGNOSIS — D469 Myelodysplastic syndrome, unspecified: Secondary | ICD-10-CM | POA: Diagnosis not present

## 2014-01-18 DIAGNOSIS — C9110 Chronic lymphocytic leukemia of B-cell type not having achieved remission: Secondary | ICD-10-CM | POA: Diagnosis not present

## 2014-01-18 DIAGNOSIS — Z853 Personal history of malignant neoplasm of breast: Secondary | ICD-10-CM | POA: Diagnosis not present

## 2014-01-18 DIAGNOSIS — D649 Anemia, unspecified: Secondary | ICD-10-CM | POA: Diagnosis not present

## 2014-01-18 DIAGNOSIS — R413 Other amnesia: Secondary | ICD-10-CM | POA: Diagnosis not present

## 2014-02-01 DIAGNOSIS — D469 Myelodysplastic syndrome, unspecified: Secondary | ICD-10-CM | POA: Diagnosis not present

## 2014-02-01 DIAGNOSIS — R32 Unspecified urinary incontinence: Secondary | ICD-10-CM | POA: Diagnosis not present

## 2014-02-01 DIAGNOSIS — R300 Dysuria: Secondary | ICD-10-CM | POA: Diagnosis not present

## 2014-02-01 DIAGNOSIS — D61818 Other pancytopenia: Secondary | ICD-10-CM | POA: Diagnosis not present

## 2014-02-01 DIAGNOSIS — R351 Nocturia: Secondary | ICD-10-CM | POA: Diagnosis not present

## 2014-02-01 IMAGING — CT CT BIOPSY
1 series · 1 of 23 positions shown · non-contrast
Comparison: none

INDICATION: Pancytopenia, CLL, MDS

[Series 2: localizer · axial · 5.0mm · 0.66mm/px · 1 of 23 slices shown]
[im 12/23]
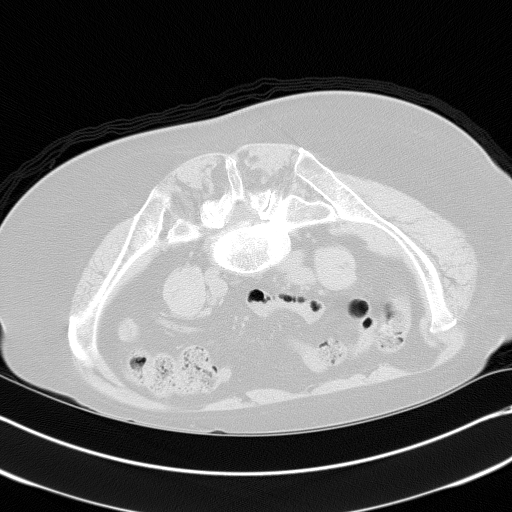

[1 of 23 positions shown; findings below may reference images not displayed]

CT GUIDED LEFT ILIAC BONE MARROW ASPIRATION AND BONE MARROW CORE
BIOPSIES

Intravenous medications: Fentanyl 50 mcg IV; Versed 2 mg IV

Sedation time: 15 minutes

Contrast volume: None

Complications: None immediate

PROCEDURE/FINDINGS:

Informed consent was obtained from the patient following an
explanation of the procedure, risks, benefits and alternatives.
The patient understands, agrees and consents for the procedure.
All questions were addressed.  A time out was performed prior to
the initiation of the procedure.  The patient was positioned prone
and noncontrast localization CT was performed of the pelvis to
demonstrate the iliac marrow spaces.  The operative site was
prepped and draped in the usual sterile fashion.

Under sterile conditions and local anesthesia, an 11 gauge coaxial
bone biopsy needle was advanced into the left iliac marrow space.
Needle position was confirmed with CT imaging.  Initially, bone
marrow aspiration was performed. Next, a bone marrow biopsy was
obtained with the 11 gauge outer bone marrow device. The 11 gauge
coaxial bone biopsy needle was readvanced into a slightly different
location within the left iliac marrow space, positioning was
confirmed and an additional bone marrow aspiration and biopsy were
obtained.  Samples were prepared with the cytotechnologist and
deemed adequate.  The needle was removed intact.  Hemostasis was
obtained with compression and a dressing was placed. The patient
tolerated the procedure well without immediate post procedural
complication.
IMPRESSION: Successful CT guided left iliac bone marrow aspiration and core
biopsies.

## 2014-02-15 DIAGNOSIS — D469 Myelodysplastic syndrome, unspecified: Secondary | ICD-10-CM | POA: Diagnosis not present

## 2014-02-15 DIAGNOSIS — R7989 Other specified abnormal findings of blood chemistry: Secondary | ICD-10-CM | POA: Diagnosis not present

## 2014-02-15 DIAGNOSIS — T424X5A Adverse effect of benzodiazepines, initial encounter: Secondary | ICD-10-CM | POA: Diagnosis not present

## 2014-02-15 DIAGNOSIS — Z853 Personal history of malignant neoplasm of breast: Secondary | ICD-10-CM | POA: Diagnosis not present

## 2014-02-15 DIAGNOSIS — N3944 Nocturnal enuresis: Secondary | ICD-10-CM | POA: Diagnosis not present

## 2014-02-15 DIAGNOSIS — Z9221 Personal history of antineoplastic chemotherapy: Secondary | ICD-10-CM | POA: Diagnosis not present

## 2014-02-15 DIAGNOSIS — R0989 Other specified symptoms and signs involving the circulatory and respiratory systems: Secondary | ICD-10-CM | POA: Diagnosis not present

## 2014-02-15 DIAGNOSIS — R05 Cough: Secondary | ICD-10-CM | POA: Diagnosis not present

## 2014-02-15 DIAGNOSIS — R413 Other amnesia: Secondary | ICD-10-CM | POA: Diagnosis not present

## 2014-02-15 DIAGNOSIS — Z923 Personal history of irradiation: Secondary | ICD-10-CM | POA: Diagnosis not present

## 2014-03-01 DIAGNOSIS — D469 Myelodysplastic syndrome, unspecified: Secondary | ICD-10-CM | POA: Diagnosis not present

## 2014-03-15 DIAGNOSIS — D696 Thrombocytopenia, unspecified: Secondary | ICD-10-CM | POA: Diagnosis not present

## 2014-03-15 DIAGNOSIS — D469 Myelodysplastic syndrome, unspecified: Secondary | ICD-10-CM | POA: Diagnosis not present

## 2014-03-15 DIAGNOSIS — R3915 Urgency of urination: Secondary | ICD-10-CM | POA: Diagnosis not present

## 2014-03-15 DIAGNOSIS — D649 Anemia, unspecified: Secondary | ICD-10-CM | POA: Diagnosis not present

## 2014-03-29 DIAGNOSIS — Z853 Personal history of malignant neoplasm of breast: Secondary | ICD-10-CM | POA: Diagnosis not present

## 2014-03-29 DIAGNOSIS — R413 Other amnesia: Secondary | ICD-10-CM | POA: Diagnosis not present

## 2014-03-29 DIAGNOSIS — Z856 Personal history of leukemia: Secondary | ICD-10-CM | POA: Diagnosis not present

## 2014-03-29 DIAGNOSIS — Z887 Allergy status to serum and vaccine status: Secondary | ICD-10-CM | POA: Diagnosis not present

## 2014-03-29 DIAGNOSIS — D469 Myelodysplastic syndrome, unspecified: Secondary | ICD-10-CM | POA: Diagnosis not present

## 2014-03-29 DIAGNOSIS — R300 Dysuria: Secondary | ICD-10-CM | POA: Diagnosis not present

## 2014-03-29 DIAGNOSIS — Z882 Allergy status to sulfonamides status: Secondary | ICD-10-CM | POA: Diagnosis not present

## 2014-03-29 DIAGNOSIS — D759 Disease of blood and blood-forming organs, unspecified: Secondary | ICD-10-CM | POA: Diagnosis not present

## 2014-03-29 DIAGNOSIS — D61818 Other pancytopenia: Secondary | ICD-10-CM | POA: Diagnosis not present

## 2014-03-29 DIAGNOSIS — R32 Unspecified urinary incontinence: Secondary | ICD-10-CM | POA: Diagnosis not present

## 2014-03-29 DIAGNOSIS — D696 Thrombocytopenia, unspecified: Secondary | ICD-10-CM | POA: Diagnosis not present

## 2014-04-12 DIAGNOSIS — D759 Disease of blood and blood-forming organs, unspecified: Secondary | ICD-10-CM | POA: Diagnosis not present

## 2014-04-12 DIAGNOSIS — R413 Other amnesia: Secondary | ICD-10-CM | POA: Diagnosis not present

## 2014-04-12 DIAGNOSIS — E039 Hypothyroidism, unspecified: Secondary | ICD-10-CM | POA: Diagnosis not present

## 2014-04-12 DIAGNOSIS — R32 Unspecified urinary incontinence: Secondary | ICD-10-CM | POA: Diagnosis not present

## 2014-04-12 DIAGNOSIS — Z853 Personal history of malignant neoplasm of breast: Secondary | ICD-10-CM | POA: Diagnosis not present

## 2014-04-12 DIAGNOSIS — D469 Myelodysplastic syndrome, unspecified: Secondary | ICD-10-CM | POA: Diagnosis not present

## 2014-04-12 DIAGNOSIS — D61818 Other pancytopenia: Secondary | ICD-10-CM | POA: Diagnosis not present

## 2014-04-12 DIAGNOSIS — Z882 Allergy status to sulfonamides status: Secondary | ICD-10-CM | POA: Diagnosis not present

## 2014-04-12 DIAGNOSIS — Z856 Personal history of leukemia: Secondary | ICD-10-CM | POA: Diagnosis not present

## 2014-04-12 DIAGNOSIS — D696 Thrombocytopenia, unspecified: Secondary | ICD-10-CM | POA: Diagnosis not present

## 2014-04-12 DIAGNOSIS — R300 Dysuria: Secondary | ICD-10-CM | POA: Diagnosis not present

## 2014-04-12 DIAGNOSIS — D649 Anemia, unspecified: Secondary | ICD-10-CM | POA: Diagnosis not present

## 2014-04-26 DIAGNOSIS — D469 Myelodysplastic syndrome, unspecified: Secondary | ICD-10-CM | POA: Diagnosis not present

## 2014-05-10 DIAGNOSIS — D61818 Other pancytopenia: Secondary | ICD-10-CM | POA: Diagnosis not present

## 2014-05-10 DIAGNOSIS — D696 Thrombocytopenia, unspecified: Secondary | ICD-10-CM | POA: Diagnosis not present

## 2014-05-10 DIAGNOSIS — D469 Myelodysplastic syndrome, unspecified: Secondary | ICD-10-CM | POA: Diagnosis not present

## 2014-05-24 DIAGNOSIS — Z79899 Other long term (current) drug therapy: Secondary | ICD-10-CM | POA: Diagnosis not present

## 2014-05-24 DIAGNOSIS — Z856 Personal history of leukemia: Secondary | ICD-10-CM | POA: Diagnosis not present

## 2014-05-24 DIAGNOSIS — Z882 Allergy status to sulfonamides status: Secondary | ICD-10-CM | POA: Diagnosis not present

## 2014-05-24 DIAGNOSIS — Z853 Personal history of malignant neoplasm of breast: Secondary | ICD-10-CM | POA: Diagnosis not present

## 2014-05-24 DIAGNOSIS — R7989 Other specified abnormal findings of blood chemistry: Secondary | ICD-10-CM | POA: Diagnosis not present

## 2014-05-24 DIAGNOSIS — D649 Anemia, unspecified: Secondary | ICD-10-CM | POA: Diagnosis not present

## 2014-05-24 DIAGNOSIS — R413 Other amnesia: Secondary | ICD-10-CM | POA: Diagnosis not present

## 2014-05-24 DIAGNOSIS — D696 Thrombocytopenia, unspecified: Secondary | ICD-10-CM | POA: Diagnosis not present

## 2014-05-24 DIAGNOSIS — R32 Unspecified urinary incontinence: Secondary | ICD-10-CM | POA: Diagnosis not present

## 2014-05-24 DIAGNOSIS — E039 Hypothyroidism, unspecified: Secondary | ICD-10-CM | POA: Diagnosis not present

## 2014-05-24 DIAGNOSIS — D61818 Other pancytopenia: Secondary | ICD-10-CM | POA: Diagnosis not present

## 2014-05-24 DIAGNOSIS — Z887 Allergy status to serum and vaccine status: Secondary | ICD-10-CM | POA: Diagnosis not present

## 2014-05-24 DIAGNOSIS — D469 Myelodysplastic syndrome, unspecified: Secondary | ICD-10-CM | POA: Diagnosis not present

## 2014-05-24 DIAGNOSIS — Z883 Allergy status to other anti-infective agents status: Secondary | ICD-10-CM | POA: Diagnosis not present

## 2014-06-07 DIAGNOSIS — Z888 Allergy status to other drugs, medicaments and biological substances status: Secondary | ICD-10-CM | POA: Diagnosis not present

## 2014-06-07 DIAGNOSIS — D61818 Other pancytopenia: Secondary | ICD-10-CM | POA: Diagnosis not present

## 2014-06-07 DIAGNOSIS — D469 Myelodysplastic syndrome, unspecified: Secondary | ICD-10-CM | POA: Diagnosis not present

## 2014-06-07 DIAGNOSIS — Z853 Personal history of malignant neoplasm of breast: Secondary | ICD-10-CM | POA: Diagnosis not present

## 2014-06-07 DIAGNOSIS — Z79899 Other long term (current) drug therapy: Secondary | ICD-10-CM | POA: Diagnosis not present

## 2014-06-07 DIAGNOSIS — T424X5A Adverse effect of benzodiazepines, initial encounter: Secondary | ICD-10-CM | POA: Diagnosis not present

## 2014-06-07 DIAGNOSIS — R413 Other amnesia: Secondary | ICD-10-CM | POA: Diagnosis not present

## 2014-06-07 DIAGNOSIS — Z9889 Other specified postprocedural states: Secondary | ICD-10-CM | POA: Diagnosis not present

## 2014-06-07 DIAGNOSIS — D696 Thrombocytopenia, unspecified: Secondary | ICD-10-CM | POA: Diagnosis not present

## 2014-06-07 DIAGNOSIS — C9110 Chronic lymphocytic leukemia of B-cell type not having achieved remission: Secondary | ICD-10-CM | POA: Diagnosis not present

## 2014-06-07 DIAGNOSIS — R197 Diarrhea, unspecified: Secondary | ICD-10-CM | POA: Diagnosis not present

## 2014-06-07 DIAGNOSIS — R300 Dysuria: Secondary | ICD-10-CM | POA: Diagnosis not present

## 2014-06-07 DIAGNOSIS — Z882 Allergy status to sulfonamides status: Secondary | ICD-10-CM | POA: Diagnosis not present

## 2014-06-07 DIAGNOSIS — R32 Unspecified urinary incontinence: Secondary | ICD-10-CM | POA: Diagnosis not present

## 2014-06-08 DIAGNOSIS — D469 Myelodysplastic syndrome, unspecified: Secondary | ICD-10-CM | POA: Diagnosis not present

## 2014-06-21 DIAGNOSIS — Z882 Allergy status to sulfonamides status: Secondary | ICD-10-CM | POA: Diagnosis not present

## 2014-06-21 DIAGNOSIS — R197 Diarrhea, unspecified: Secondary | ICD-10-CM | POA: Diagnosis not present

## 2014-06-21 DIAGNOSIS — R32 Unspecified urinary incontinence: Secondary | ICD-10-CM | POA: Diagnosis not present

## 2014-06-21 DIAGNOSIS — D61818 Other pancytopenia: Secondary | ICD-10-CM | POA: Diagnosis not present

## 2014-06-21 DIAGNOSIS — R300 Dysuria: Secondary | ICD-10-CM | POA: Diagnosis not present

## 2014-06-21 DIAGNOSIS — Z853 Personal history of malignant neoplasm of breast: Secondary | ICD-10-CM | POA: Diagnosis not present

## 2014-06-21 DIAGNOSIS — R351 Nocturia: Secondary | ICD-10-CM | POA: Diagnosis not present

## 2014-06-21 DIAGNOSIS — E039 Hypothyroidism, unspecified: Secondary | ICD-10-CM | POA: Diagnosis not present

## 2014-06-21 DIAGNOSIS — D469 Myelodysplastic syndrome, unspecified: Secondary | ICD-10-CM | POA: Diagnosis not present

## 2014-06-21 DIAGNOSIS — R413 Other amnesia: Secondary | ICD-10-CM | POA: Diagnosis not present

## 2014-06-21 DIAGNOSIS — D696 Thrombocytopenia, unspecified: Secondary | ICD-10-CM | POA: Diagnosis not present

## 2014-07-05 DIAGNOSIS — D696 Thrombocytopenia, unspecified: Secondary | ICD-10-CM | POA: Diagnosis not present

## 2014-07-05 DIAGNOSIS — R413 Other amnesia: Secondary | ICD-10-CM | POA: Diagnosis not present

## 2014-07-05 DIAGNOSIS — Z9889 Other specified postprocedural states: Secondary | ICD-10-CM | POA: Diagnosis not present

## 2014-07-05 DIAGNOSIS — R32 Unspecified urinary incontinence: Secondary | ICD-10-CM | POA: Diagnosis not present

## 2014-07-05 DIAGNOSIS — R300 Dysuria: Secondary | ICD-10-CM | POA: Diagnosis not present

## 2014-07-05 DIAGNOSIS — Z79899 Other long term (current) drug therapy: Secondary | ICD-10-CM | POA: Diagnosis not present

## 2014-07-05 DIAGNOSIS — Z888 Allergy status to other drugs, medicaments and biological substances status: Secondary | ICD-10-CM | POA: Diagnosis not present

## 2014-07-05 DIAGNOSIS — Z882 Allergy status to sulfonamides status: Secondary | ICD-10-CM | POA: Diagnosis not present

## 2014-07-05 DIAGNOSIS — R197 Diarrhea, unspecified: Secondary | ICD-10-CM | POA: Diagnosis not present

## 2014-07-05 DIAGNOSIS — D469 Myelodysplastic syndrome, unspecified: Secondary | ICD-10-CM | POA: Diagnosis not present

## 2014-07-05 DIAGNOSIS — Z853 Personal history of malignant neoplasm of breast: Secondary | ICD-10-CM | POA: Diagnosis not present

## 2014-07-05 DIAGNOSIS — D7581 Myelofibrosis: Secondary | ICD-10-CM | POA: Diagnosis not present

## 2014-07-05 DIAGNOSIS — Z9221 Personal history of antineoplastic chemotherapy: Secondary | ICD-10-CM | POA: Diagnosis not present

## 2014-07-05 DIAGNOSIS — R351 Nocturia: Secondary | ICD-10-CM | POA: Diagnosis not present

## 2014-07-19 DIAGNOSIS — Z9221 Personal history of antineoplastic chemotherapy: Secondary | ICD-10-CM | POA: Diagnosis not present

## 2014-07-19 DIAGNOSIS — Z923 Personal history of irradiation: Secondary | ICD-10-CM | POA: Diagnosis not present

## 2014-07-19 DIAGNOSIS — Z887 Allergy status to serum and vaccine status: Secondary | ICD-10-CM | POA: Diagnosis not present

## 2014-07-19 DIAGNOSIS — Z888 Allergy status to other drugs, medicaments and biological substances status: Secondary | ICD-10-CM | POA: Diagnosis not present

## 2014-07-19 DIAGNOSIS — D696 Thrombocytopenia, unspecified: Secondary | ICD-10-CM | POA: Diagnosis not present

## 2014-07-19 DIAGNOSIS — E039 Hypothyroidism, unspecified: Secondary | ICD-10-CM | POA: Diagnosis not present

## 2014-07-19 DIAGNOSIS — R413 Other amnesia: Secondary | ICD-10-CM | POA: Diagnosis not present

## 2014-07-19 DIAGNOSIS — Z853 Personal history of malignant neoplasm of breast: Secondary | ICD-10-CM | POA: Diagnosis not present

## 2014-07-19 DIAGNOSIS — R2232 Localized swelling, mass and lump, left upper limb: Secondary | ICD-10-CM | POA: Diagnosis not present

## 2014-07-19 DIAGNOSIS — R32 Unspecified urinary incontinence: Secondary | ICD-10-CM | POA: Diagnosis not present

## 2014-07-19 DIAGNOSIS — R300 Dysuria: Secondary | ICD-10-CM | POA: Diagnosis not present

## 2014-07-19 DIAGNOSIS — D469 Myelodysplastic syndrome, unspecified: Secondary | ICD-10-CM | POA: Diagnosis not present

## 2014-07-19 DIAGNOSIS — C9110 Chronic lymphocytic leukemia of B-cell type not having achieved remission: Secondary | ICD-10-CM | POA: Diagnosis not present

## 2014-08-02 DIAGNOSIS — Z888 Allergy status to other drugs, medicaments and biological substances status: Secondary | ICD-10-CM | POA: Diagnosis not present

## 2014-08-02 DIAGNOSIS — D469 Myelodysplastic syndrome, unspecified: Secondary | ICD-10-CM | POA: Diagnosis not present

## 2014-08-02 DIAGNOSIS — R947 Abnormal results of other endocrine function studies: Secondary | ICD-10-CM | POA: Diagnosis not present

## 2014-08-02 DIAGNOSIS — Z9221 Personal history of antineoplastic chemotherapy: Secondary | ICD-10-CM | POA: Diagnosis not present

## 2014-08-02 DIAGNOSIS — Z882 Allergy status to sulfonamides status: Secondary | ICD-10-CM | POA: Diagnosis not present

## 2014-08-02 DIAGNOSIS — R413 Other amnesia: Secondary | ICD-10-CM | POA: Diagnosis not present

## 2014-08-02 DIAGNOSIS — Z9889 Other specified postprocedural states: Secondary | ICD-10-CM | POA: Diagnosis not present

## 2014-08-02 DIAGNOSIS — R197 Diarrhea, unspecified: Secondary | ICD-10-CM | POA: Diagnosis not present

## 2014-08-02 DIAGNOSIS — Z853 Personal history of malignant neoplasm of breast: Secondary | ICD-10-CM | POA: Diagnosis not present

## 2014-08-02 DIAGNOSIS — R740 Nonspecific elevation of levels of transaminase and lactic acid dehydrogenase [LDH]: Secondary | ICD-10-CM | POA: Diagnosis not present

## 2014-08-16 DIAGNOSIS — R413 Other amnesia: Secondary | ICD-10-CM | POA: Diagnosis not present

## 2014-08-16 DIAGNOSIS — R4701 Aphasia: Secondary | ICD-10-CM | POA: Diagnosis not present

## 2014-08-16 DIAGNOSIS — Z23 Encounter for immunization: Secondary | ICD-10-CM | POA: Diagnosis not present

## 2014-08-16 DIAGNOSIS — R300 Dysuria: Secondary | ICD-10-CM | POA: Diagnosis not present

## 2014-08-16 DIAGNOSIS — T424X5D Adverse effect of benzodiazepines, subsequent encounter: Secondary | ICD-10-CM | POA: Diagnosis not present

## 2014-08-16 DIAGNOSIS — R351 Nocturia: Secondary | ICD-10-CM | POA: Diagnosis not present

## 2014-08-16 DIAGNOSIS — R32 Unspecified urinary incontinence: Secondary | ICD-10-CM | POA: Diagnosis not present

## 2014-08-16 DIAGNOSIS — D7581 Myelofibrosis: Secondary | ICD-10-CM | POA: Diagnosis not present

## 2014-08-16 DIAGNOSIS — Z856 Personal history of leukemia: Secondary | ICD-10-CM | POA: Diagnosis not present

## 2014-08-16 DIAGNOSIS — D469 Myelodysplastic syndrome, unspecified: Secondary | ICD-10-CM | POA: Diagnosis not present

## 2014-08-16 DIAGNOSIS — E039 Hypothyroidism, unspecified: Secondary | ICD-10-CM | POA: Diagnosis not present

## 2014-08-16 DIAGNOSIS — L989 Disorder of the skin and subcutaneous tissue, unspecified: Secondary | ICD-10-CM | POA: Diagnosis not present

## 2014-08-16 DIAGNOSIS — R197 Diarrhea, unspecified: Secondary | ICD-10-CM | POA: Diagnosis not present

## 2014-08-16 DIAGNOSIS — R2232 Localized swelling, mass and lump, left upper limb: Secondary | ICD-10-CM | POA: Diagnosis not present

## 2014-08-16 DIAGNOSIS — R400 Somnolence: Secondary | ICD-10-CM | POA: Diagnosis not present

## 2014-08-16 DIAGNOSIS — D696 Thrombocytopenia, unspecified: Secondary | ICD-10-CM | POA: Diagnosis not present

## 2014-08-16 DIAGNOSIS — Z853 Personal history of malignant neoplasm of breast: Secondary | ICD-10-CM | POA: Diagnosis not present

## 2014-08-30 DIAGNOSIS — D696 Thrombocytopenia, unspecified: Secondary | ICD-10-CM | POA: Diagnosis not present

## 2014-08-30 DIAGNOSIS — R229 Localized swelling, mass and lump, unspecified: Secondary | ICD-10-CM | POA: Diagnosis not present

## 2014-08-30 DIAGNOSIS — Z853 Personal history of malignant neoplasm of breast: Secondary | ICD-10-CM | POA: Diagnosis not present

## 2014-08-30 DIAGNOSIS — R400 Somnolence: Secondary | ICD-10-CM | POA: Diagnosis not present

## 2014-08-30 DIAGNOSIS — B358 Other dermatophytoses: Secondary | ICD-10-CM | POA: Diagnosis not present

## 2014-08-30 DIAGNOSIS — N3944 Nocturnal enuresis: Secondary | ICD-10-CM | POA: Diagnosis not present

## 2014-08-30 DIAGNOSIS — R413 Other amnesia: Secondary | ICD-10-CM | POA: Diagnosis not present

## 2014-08-30 DIAGNOSIS — T424X5A Adverse effect of benzodiazepines, initial encounter: Secondary | ICD-10-CM | POA: Diagnosis not present

## 2014-08-30 DIAGNOSIS — R197 Diarrhea, unspecified: Secondary | ICD-10-CM | POA: Diagnosis not present

## 2014-08-30 DIAGNOSIS — R300 Dysuria: Secondary | ICD-10-CM | POA: Diagnosis not present

## 2014-08-30 DIAGNOSIS — D469 Myelodysplastic syndrome, unspecified: Secondary | ICD-10-CM | POA: Diagnosis not present

## 2014-08-30 DIAGNOSIS — Z856 Personal history of leukemia: Secondary | ICD-10-CM | POA: Diagnosis not present

## 2014-08-30 DIAGNOSIS — R748 Abnormal levels of other serum enzymes: Secondary | ICD-10-CM | POA: Diagnosis not present

## 2014-09-02 DIAGNOSIS — N390 Urinary tract infection, site not specified: Secondary | ICD-10-CM | POA: Diagnosis not present

## 2014-09-09 DIAGNOSIS — Z6821 Body mass index (BMI) 21.0-21.9, adult: Secondary | ICD-10-CM | POA: Diagnosis not present

## 2014-09-09 DIAGNOSIS — N390 Urinary tract infection, site not specified: Secondary | ICD-10-CM | POA: Diagnosis not present

## 2014-09-13 DIAGNOSIS — Z853 Personal history of malignant neoplasm of breast: Secondary | ICD-10-CM | POA: Diagnosis not present

## 2014-09-13 DIAGNOSIS — M7581 Other shoulder lesions, right shoulder: Secondary | ICD-10-CM | POA: Diagnosis not present

## 2014-09-13 DIAGNOSIS — T50905A Adverse effect of unspecified drugs, medicaments and biological substances, initial encounter: Secondary | ICD-10-CM | POA: Diagnosis not present

## 2014-09-13 DIAGNOSIS — R300 Dysuria: Secondary | ICD-10-CM | POA: Diagnosis not present

## 2014-09-13 DIAGNOSIS — D469 Myelodysplastic syndrome, unspecified: Secondary | ICD-10-CM | POA: Diagnosis not present

## 2014-09-13 DIAGNOSIS — Z79899 Other long term (current) drug therapy: Secondary | ICD-10-CM | POA: Diagnosis not present

## 2014-09-13 DIAGNOSIS — Z9889 Other specified postprocedural states: Secondary | ICD-10-CM | POA: Diagnosis not present

## 2014-09-13 DIAGNOSIS — L988 Other specified disorders of the skin and subcutaneous tissue: Secondary | ICD-10-CM | POA: Diagnosis not present

## 2014-09-13 DIAGNOSIS — Z888 Allergy status to other drugs, medicaments and biological substances status: Secondary | ICD-10-CM | POA: Diagnosis not present

## 2014-09-13 DIAGNOSIS — R748 Abnormal levels of other serum enzymes: Secondary | ICD-10-CM | POA: Diagnosis not present

## 2014-09-13 DIAGNOSIS — R740 Nonspecific elevation of levels of transaminase and lactic acid dehydrogenase [LDH]: Secondary | ICD-10-CM | POA: Diagnosis not present

## 2014-09-13 DIAGNOSIS — Z881 Allergy status to other antibiotic agents status: Secondary | ICD-10-CM | POA: Diagnosis not present

## 2014-09-13 DIAGNOSIS — R413 Other amnesia: Secondary | ICD-10-CM | POA: Diagnosis not present

## 2014-09-13 DIAGNOSIS — D696 Thrombocytopenia, unspecified: Secondary | ICD-10-CM | POA: Diagnosis not present

## 2014-09-13 DIAGNOSIS — R197 Diarrhea, unspecified: Secondary | ICD-10-CM | POA: Diagnosis not present

## 2014-09-27 DIAGNOSIS — R32 Unspecified urinary incontinence: Secondary | ICD-10-CM | POA: Diagnosis not present

## 2014-09-27 DIAGNOSIS — R413 Other amnesia: Secondary | ICD-10-CM | POA: Diagnosis not present

## 2014-09-27 DIAGNOSIS — R197 Diarrhea, unspecified: Secondary | ICD-10-CM | POA: Diagnosis not present

## 2014-09-27 DIAGNOSIS — R300 Dysuria: Secondary | ICD-10-CM | POA: Diagnosis not present

## 2014-09-27 DIAGNOSIS — T50905A Adverse effect of unspecified drugs, medicaments and biological substances, initial encounter: Secondary | ICD-10-CM | POA: Diagnosis not present

## 2014-09-27 DIAGNOSIS — L989 Disorder of the skin and subcutaneous tissue, unspecified: Secondary | ICD-10-CM | POA: Diagnosis not present

## 2014-09-27 DIAGNOSIS — D469 Myelodysplastic syndrome, unspecified: Secondary | ICD-10-CM | POA: Diagnosis not present

## 2014-09-27 DIAGNOSIS — Z853 Personal history of malignant neoplasm of breast: Secondary | ICD-10-CM | POA: Diagnosis not present

## 2014-09-27 DIAGNOSIS — Z882 Allergy status to sulfonamides status: Secondary | ICD-10-CM | POA: Diagnosis not present

## 2014-09-27 DIAGNOSIS — R351 Nocturia: Secondary | ICD-10-CM | POA: Diagnosis not present

## 2014-09-29 ENCOUNTER — Emergency Department (INDEPENDENT_AMBULATORY_CARE_PROVIDER_SITE_OTHER)
Admission: EM | Admit: 2014-09-29 | Discharge: 2014-09-29 | Disposition: A | Discharge status: Home / Self Care | Payer: Medicare Other | Source: Home / Self Care | Attending: Family Medicine | Admitting: Family Medicine

## 2014-09-29 ENCOUNTER — Encounter (HOSPITAL_COMMUNITY): Payer: Self-pay | Admitting: *Deleted

## 2014-09-29 ENCOUNTER — Emergency Department (INDEPENDENT_AMBULATORY_CARE_PROVIDER_SITE_OTHER): Payer: Medicare Other

## 2014-09-29 DIAGNOSIS — S29012A Strain of muscle and tendon of back wall of thorax, initial encounter: Secondary | ICD-10-CM | POA: Diagnosis not present

## 2014-09-29 DIAGNOSIS — M7989 Other specified soft tissue disorders: Secondary | ICD-10-CM | POA: Diagnosis not present

## 2014-09-29 DIAGNOSIS — R1012 Left upper quadrant pain: Secondary | ICD-10-CM | POA: Diagnosis not present

## 2014-09-29 MED ORDER — METHOCARBAMOL 500 MG PO TABS: 500.0000 mg | ORAL_TABLET | Freq: Two times a day (BID) | ORAL | Status: DC

## 2014-09-29 NOTE — Discharge Instructions (Signed)
Heat, stretch as shown and use medicine as needed, see your doctor if further problems.

## 2014-09-29 NOTE — ED Notes (Signed)
Pt    Reports      Pain    l  Shoulder     X  sev weeks          Pt  denys  Any  specefic  Injury          Pt    Reports  History  Of       leukemia

## 2014-09-29 NOTE — ED Provider Notes (Signed)
CSN: [id number]     Arrival date & time 09/29/14  1301 History   First MD Initiated Contact with Patient 09/29/14 1316     Chief Complaint  Patient presents with  . Shoulder Pain   (Consider location/radiation/quality/duration/timing/severity/associated sxs/prior Treatment) Patient is a 81 y.o. female presenting with shoulder pain. The history is provided by the patient and the spouse.  Shoulder Pain Location:  Shoulder Time since incident:  2 weeks Injury: no   Shoulder location:  L shoulder Pain details:    Quality:  Sharp   Radiates to:  Does not radiate   Severity:  Mild   Onset quality:  Gradual   Progression:  Waxing and waning Chronicity:  New Dislocation: no   Prior injury to area:  No Relieved by:  None tried Worsened by:  Nothing tried Ineffective treatments:  None tried Associated symptoms: back pain, decreased range of motion and muscle weakness   Associated symptoms: no fever, no neck pain, no numbness and no tingling     Past Medical History  Diagnosis Date  . Heart murmur   . CLL (chronic lymphocytic leukemia)   . Breast cancer     left   . Pancytopenia   . Anxiety    Past Surgical History  Procedure Laterality Date  . Breast surgery    . Breast lumpectomy  2010    left  breast  . Vaginal hysterectomy    . Bone marrow needle core biopsy     History reviewed. No pertinent family history. Social History  Substance Use Topics  . Smoking status: Never Smoker   . Smokeless tobacco: Never Used  . Alcohol Use: No   OB History    No data available     Review of Systems  Constitutional: Negative for fever.  Musculoskeletal: Positive for back pain. Negative for joint swelling, gait problem and neck pain.  Skin: Negative.   Neurological: Negative for dizziness, weakness and numbness.  All other systems reviewed and are negative.   Allergies  Pneumococcal vaccines and Sulfa antibiotics  Home Medications   Prior to Admission medications     Medication Sig Start Date End Date Taking? Authorizing Provider  ALPRAZolam (XANAX) 0.25 MG tablet Take 0.25 mg by mouth every 8 (eight) hours as needed. For anxiety.    Historical Provider, MD  Ascorbic Acid (VITAMIN C) 1000 MG tablet Take 1,000 mg by mouth every morning.    Historical Provider, MD  Calcium Carbonate-Vitamin D (CALCIUM 600 + D PO) Take 1 tablet by mouth every morning.     Historical Provider, MD  Lactobacillus-Inulin ([place] PO) Take 1 tablet by mouth daily.    Historical Provider, MD  levothyroxine (SYNTHROID, LEVOTHROID) 88 MCG tablet Take 88 mcg by mouth daily before breakfast.     Historical Provider, MD  lidocaine-prilocaine (EMLA) cream Apply topically as needed. Apply 1 teaspoon to [facility] site 2 hours prior to stick and cover with plastic wrap to numb site 02/24/12   Ladell Pier, MD  LORazepam (ATIVAN) 1 MG tablet Take 0.5 tablets (0.5 mg total) by mouth once. 02/24/12   Ladell Pier, MD  methocarbamol (ROBAXIN) 500 MG tablet Take 1 tablet (500 mg total) by mouth 2 (two) times daily. 09/29/14   Billy Fischer, MD  prochlorperazine (COMPAZINE) 10 MG tablet Take 10 mg by mouth every 6 (six) hours as needed. For nausea. 06/18/11   Ladell Pier, MD  RESVERATROL PO Take 1 capsule by mouth every morning.  Historical Provider, MD  STUDY MEDICATION Inject into the vein 2 (two) times a week. [id number] twice weekly X 3 weeks, then 1 week off at [facility] 02/25/12   Historical Provider, MD   Meds Ordered and Administered this Visit  Medications - No data to display  BP 137/79 mmHg  Pulse 85  Temp(Src) 98.3 F (36.8 C) (Oral)  Resp 16  SpO2 96% No data found.   Physical Exam  Constitutional: She is oriented to person, place, and time. She appears well-developed and well-nourished.  Musculoskeletal: She exhibits tenderness.       Thoracic back: She exhibits tenderness, bony tenderness and pain. She exhibits normal pulse.       Back:  Neurological: She is  alert and oriented to person, place, and time.  Skin: Skin is warm and dry.  Nursing note and vitals reviewed.   ED Course  Procedures (including critical care time)  Labs Review Labs Reviewed - No data to display  Imaging Review Dg Thoracic Spine 2 View  09/29/2014   CLINICAL DATA:  Left upper quadrant pain and left elbow pain. Soft tissue swelling at the T3-4 level to the right of the spine.  EXAM: THORACIC SPINE 2 VIEWS  COMPARISON:  Chest x-rays dated 08/19/2011 and 02/06/2011.  FINDINGS: Prior vertebroplasty at T7 for compression fracture. Minimal anterior wedge deformity of the superior endplate of T4, unchanged since 02/06/2011. No bone destruction or disc space narrowing or other acute abnormality. Diffuse osteopenia. Power port in place.  IMPRESSION: No acute abnormalities of the thoracic spine.   Electronically Signed   By: Lorriane Shire M.D.   On: 09/29/2014 14:00   X-rays reviewed and report per radiologist.   Visual Acuity Review  Right Eye Distance:   Left Eye Distance:   Bilateral Distance:    Right Eye Near:   Left Eye Near:    Bilateral Near:         MDM   1. Upper back strain, initial encounter        Billy Fischer, MD 09/29/14 1415

## 2014-10-04 DIAGNOSIS — Z6820 Body mass index (BMI) 20.0-20.9, adult: Secondary | ICD-10-CM | POA: Diagnosis not present

## 2014-10-04 DIAGNOSIS — Z23 Encounter for immunization: Secondary | ICD-10-CM | POA: Diagnosis not present

## 2014-10-04 DIAGNOSIS — M549 Dorsalgia, unspecified: Secondary | ICD-10-CM | POA: Diagnosis not present

## 2014-10-11 DIAGNOSIS — D469 Myelodysplastic syndrome, unspecified: Secondary | ICD-10-CM | POA: Diagnosis not present

## 2014-10-11 DIAGNOSIS — Z882 Allergy status to sulfonamides status: Secondary | ICD-10-CM | POA: Diagnosis not present

## 2014-10-11 DIAGNOSIS — T424X5D Adverse effect of benzodiazepines, subsequent encounter: Secondary | ICD-10-CM | POA: Diagnosis not present

## 2014-10-11 DIAGNOSIS — Z923 Personal history of irradiation: Secondary | ICD-10-CM | POA: Diagnosis not present

## 2014-10-11 DIAGNOSIS — R197 Diarrhea, unspecified: Secondary | ICD-10-CM | POA: Diagnosis not present

## 2014-10-11 DIAGNOSIS — R351 Nocturia: Secondary | ICD-10-CM | POA: Diagnosis not present

## 2014-10-11 DIAGNOSIS — R740 Nonspecific elevation of levels of transaminase and lactic acid dehydrogenase [LDH]: Secondary | ICD-10-CM | POA: Diagnosis not present

## 2014-10-11 DIAGNOSIS — R413 Other amnesia: Secondary | ICD-10-CM | POA: Diagnosis not present

## 2014-10-11 DIAGNOSIS — Z888 Allergy status to other drugs, medicaments and biological substances status: Secondary | ICD-10-CM | POA: Diagnosis not present

## 2014-10-11 DIAGNOSIS — Z9889 Other specified postprocedural states: Secondary | ICD-10-CM | POA: Diagnosis not present

## 2014-10-11 DIAGNOSIS — L988 Other specified disorders of the skin and subcutaneous tissue: Secondary | ICD-10-CM | POA: Diagnosis not present

## 2014-10-11 DIAGNOSIS — Z853 Personal history of malignant neoplasm of breast: Secondary | ICD-10-CM | POA: Diagnosis not present

## 2014-10-11 DIAGNOSIS — R32 Unspecified urinary incontinence: Secondary | ICD-10-CM | POA: Diagnosis not present

## 2014-10-11 DIAGNOSIS — R300 Dysuria: Secondary | ICD-10-CM | POA: Diagnosis not present

## 2014-10-25 DIAGNOSIS — D696 Thrombocytopenia, unspecified: Secondary | ICD-10-CM | POA: Diagnosis not present

## 2014-10-25 DIAGNOSIS — L989 Disorder of the skin and subcutaneous tissue, unspecified: Secondary | ICD-10-CM | POA: Diagnosis not present

## 2014-10-25 DIAGNOSIS — R32 Unspecified urinary incontinence: Secondary | ICD-10-CM | POA: Diagnosis not present

## 2014-10-25 DIAGNOSIS — R351 Nocturia: Secondary | ICD-10-CM | POA: Diagnosis not present

## 2014-10-25 DIAGNOSIS — Z853 Personal history of malignant neoplasm of breast: Secondary | ICD-10-CM | POA: Diagnosis not present

## 2014-10-25 DIAGNOSIS — R197 Diarrhea, unspecified: Secondary | ICD-10-CM | POA: Diagnosis not present

## 2014-10-25 DIAGNOSIS — T50905A Adverse effect of unspecified drugs, medicaments and biological substances, initial encounter: Secondary | ICD-10-CM | POA: Diagnosis not present

## 2014-10-25 DIAGNOSIS — R2232 Localized swelling, mass and lump, left upper limb: Secondary | ICD-10-CM | POA: Diagnosis not present

## 2014-10-25 DIAGNOSIS — D469 Myelodysplastic syndrome, unspecified: Secondary | ICD-10-CM | POA: Diagnosis not present

## 2014-10-25 DIAGNOSIS — R748 Abnormal levels of other serum enzymes: Secondary | ICD-10-CM | POA: Diagnosis not present

## 2014-10-25 DIAGNOSIS — Z9889 Other specified postprocedural states: Secondary | ICD-10-CM | POA: Diagnosis not present

## 2014-10-25 DIAGNOSIS — R300 Dysuria: Secondary | ICD-10-CM | POA: Diagnosis not present

## 2014-11-08 DIAGNOSIS — N390 Urinary tract infection, site not specified: Secondary | ICD-10-CM | POA: Diagnosis not present

## 2014-11-08 DIAGNOSIS — D469 Myelodysplastic syndrome, unspecified: Secondary | ICD-10-CM | POA: Diagnosis not present

## 2014-11-08 DIAGNOSIS — R413 Other amnesia: Secondary | ICD-10-CM | POA: Diagnosis not present

## 2014-11-08 DIAGNOSIS — Z881 Allergy status to other antibiotic agents status: Secondary | ICD-10-CM | POA: Diagnosis not present

## 2014-11-08 DIAGNOSIS — Z9889 Other specified postprocedural states: Secondary | ICD-10-CM | POA: Diagnosis not present

## 2014-11-08 DIAGNOSIS — L989 Disorder of the skin and subcutaneous tissue, unspecified: Secondary | ICD-10-CM | POA: Diagnosis not present

## 2014-11-08 DIAGNOSIS — Z882 Allergy status to sulfonamides status: Secondary | ICD-10-CM | POA: Diagnosis not present

## 2014-11-08 DIAGNOSIS — Z923 Personal history of irradiation: Secondary | ICD-10-CM | POA: Diagnosis not present

## 2014-11-08 DIAGNOSIS — D696 Thrombocytopenia, unspecified: Secondary | ICD-10-CM | POA: Diagnosis not present

## 2014-11-08 DIAGNOSIS — Z853 Personal history of malignant neoplasm of breast: Secondary | ICD-10-CM | POA: Diagnosis not present

## 2014-11-08 DIAGNOSIS — R740 Nonspecific elevation of levels of transaminase and lactic acid dehydrogenase [LDH]: Secondary | ICD-10-CM | POA: Diagnosis not present

## 2014-11-08 DIAGNOSIS — Z9221 Personal history of antineoplastic chemotherapy: Secondary | ICD-10-CM | POA: Diagnosis not present

## 2014-11-08 DIAGNOSIS — Z888 Allergy status to other drugs, medicaments and biological substances status: Secondary | ICD-10-CM | POA: Diagnosis not present

## 2014-11-22 DIAGNOSIS — R413 Other amnesia: Secondary | ICD-10-CM | POA: Diagnosis not present

## 2014-11-22 DIAGNOSIS — Z882 Allergy status to sulfonamides status: Secondary | ICD-10-CM | POA: Diagnosis not present

## 2014-11-22 DIAGNOSIS — Z79899 Other long term (current) drug therapy: Secondary | ICD-10-CM | POA: Diagnosis not present

## 2014-11-22 DIAGNOSIS — Z853 Personal history of malignant neoplasm of breast: Secondary | ICD-10-CM | POA: Diagnosis not present

## 2014-11-22 DIAGNOSIS — R740 Nonspecific elevation of levels of transaminase and lactic acid dehydrogenase [LDH]: Secondary | ICD-10-CM | POA: Diagnosis not present

## 2014-11-22 DIAGNOSIS — N390 Urinary tract infection, site not specified: Secondary | ICD-10-CM | POA: Diagnosis not present

## 2014-11-22 DIAGNOSIS — Z881 Allergy status to other antibiotic agents status: Secondary | ICD-10-CM | POA: Diagnosis not present

## 2014-11-22 DIAGNOSIS — D696 Thrombocytopenia, unspecified: Secondary | ICD-10-CM | POA: Diagnosis not present

## 2014-11-22 DIAGNOSIS — T50995A Adverse effect of other drugs, medicaments and biological substances, initial encounter: Secondary | ICD-10-CM | POA: Diagnosis not present

## 2014-11-22 DIAGNOSIS — Z08 Encounter for follow-up examination after completed treatment for malignant neoplasm: Secondary | ICD-10-CM | POA: Diagnosis not present

## 2014-11-22 DIAGNOSIS — L989 Disorder of the skin and subcutaneous tissue, unspecified: Secondary | ICD-10-CM | POA: Diagnosis not present

## 2014-11-22 DIAGNOSIS — D469 Myelodysplastic syndrome, unspecified: Secondary | ICD-10-CM | POA: Diagnosis not present

## 2014-11-22 DIAGNOSIS — Z888 Allergy status to other drugs, medicaments and biological substances status: Secondary | ICD-10-CM | POA: Diagnosis not present

## 2014-12-06 DIAGNOSIS — N390 Urinary tract infection, site not specified: Secondary | ICD-10-CM | POA: Diagnosis not present

## 2014-12-06 DIAGNOSIS — L989 Disorder of the skin and subcutaneous tissue, unspecified: Secondary | ICD-10-CM | POA: Diagnosis not present

## 2014-12-06 DIAGNOSIS — R740 Nonspecific elevation of levels of transaminase and lactic acid dehydrogenase [LDH]: Secondary | ICD-10-CM | POA: Diagnosis not present

## 2014-12-06 DIAGNOSIS — R413 Other amnesia: Secondary | ICD-10-CM | POA: Diagnosis not present

## 2014-12-06 DIAGNOSIS — D508 Other iron deficiency anemias: Secondary | ICD-10-CM | POA: Diagnosis not present

## 2014-12-06 DIAGNOSIS — I779 Disorder of arteries and arterioles, unspecified: Secondary | ICD-10-CM | POA: Diagnosis not present

## 2014-12-06 DIAGNOSIS — Z853 Personal history of malignant neoplasm of breast: Secondary | ICD-10-CM | POA: Diagnosis not present

## 2014-12-06 DIAGNOSIS — D469 Myelodysplastic syndrome, unspecified: Secondary | ICD-10-CM | POA: Diagnosis not present

## 2014-12-06 DIAGNOSIS — D696 Thrombocytopenia, unspecified: Secondary | ICD-10-CM | POA: Diagnosis not present

## 2014-12-20 DIAGNOSIS — Z853 Personal history of malignant neoplasm of breast: Secondary | ICD-10-CM | POA: Diagnosis not present

## 2014-12-20 DIAGNOSIS — D696 Thrombocytopenia, unspecified: Secondary | ICD-10-CM | POA: Diagnosis not present

## 2014-12-20 DIAGNOSIS — D469 Myelodysplastic syndrome, unspecified: Secondary | ICD-10-CM | POA: Diagnosis not present

## 2014-12-20 DIAGNOSIS — R413 Other amnesia: Secondary | ICD-10-CM | POA: Diagnosis not present

## 2015-01-03 DIAGNOSIS — Z9889 Other specified postprocedural states: Secondary | ICD-10-CM | POA: Diagnosis not present

## 2015-01-03 DIAGNOSIS — R740 Nonspecific elevation of levels of transaminase and lactic acid dehydrogenase [LDH]: Secondary | ICD-10-CM | POA: Diagnosis not present

## 2015-01-03 DIAGNOSIS — R351 Nocturia: Secondary | ICD-10-CM | POA: Diagnosis not present

## 2015-01-03 DIAGNOSIS — Z856 Personal history of leukemia: Secondary | ICD-10-CM | POA: Diagnosis not present

## 2015-01-03 DIAGNOSIS — Z887 Allergy status to serum and vaccine status: Secondary | ICD-10-CM | POA: Diagnosis not present

## 2015-01-03 DIAGNOSIS — Z881 Allergy status to other antibiotic agents status: Secondary | ICD-10-CM | POA: Diagnosis not present

## 2015-01-03 DIAGNOSIS — Z87440 Personal history of urinary (tract) infections: Secondary | ICD-10-CM | POA: Diagnosis not present

## 2015-01-03 DIAGNOSIS — Z923 Personal history of irradiation: Secondary | ICD-10-CM | POA: Diagnosis not present

## 2015-01-03 DIAGNOSIS — Z853 Personal history of malignant neoplasm of breast: Secondary | ICD-10-CM | POA: Diagnosis not present

## 2015-01-03 DIAGNOSIS — L821 Other seborrheic keratosis: Secondary | ICD-10-CM | POA: Diagnosis not present

## 2015-01-03 DIAGNOSIS — R2232 Localized swelling, mass and lump, left upper limb: Secondary | ICD-10-CM | POA: Diagnosis not present

## 2015-01-03 DIAGNOSIS — Z9221 Personal history of antineoplastic chemotherapy: Secondary | ICD-10-CM | POA: Diagnosis not present

## 2015-01-03 DIAGNOSIS — E039 Hypothyroidism, unspecified: Secondary | ICD-10-CM | POA: Diagnosis not present

## 2015-01-03 DIAGNOSIS — D469 Myelodysplastic syndrome, unspecified: Secondary | ICD-10-CM | POA: Diagnosis not present

## 2015-01-03 DIAGNOSIS — R413 Other amnesia: Secondary | ICD-10-CM | POA: Diagnosis not present

## 2015-01-03 DIAGNOSIS — B358 Other dermatophytoses: Secondary | ICD-10-CM | POA: Diagnosis not present

## 2015-01-03 DIAGNOSIS — Z882 Allergy status to sulfonamides status: Secondary | ICD-10-CM | POA: Diagnosis not present

## 2015-01-03 DIAGNOSIS — R32 Unspecified urinary incontinence: Secondary | ICD-10-CM | POA: Diagnosis not present

## 2015-01-03 DIAGNOSIS — R300 Dysuria: Secondary | ICD-10-CM | POA: Diagnosis not present

## 2015-01-03 DIAGNOSIS — T424X5A Adverse effect of benzodiazepines, initial encounter: Secondary | ICD-10-CM | POA: Diagnosis not present

## 2015-01-17 DIAGNOSIS — R351 Nocturia: Secondary | ICD-10-CM | POA: Diagnosis not present

## 2015-01-17 DIAGNOSIS — Z9889 Other specified postprocedural states: Secondary | ICD-10-CM | POA: Diagnosis not present

## 2015-01-17 DIAGNOSIS — D696 Thrombocytopenia, unspecified: Secondary | ICD-10-CM | POA: Diagnosis not present

## 2015-01-17 DIAGNOSIS — R300 Dysuria: Secondary | ICD-10-CM | POA: Diagnosis not present

## 2015-01-17 DIAGNOSIS — E039 Hypothyroidism, unspecified: Secondary | ICD-10-CM | POA: Diagnosis not present

## 2015-01-17 DIAGNOSIS — L989 Disorder of the skin and subcutaneous tissue, unspecified: Secondary | ICD-10-CM | POA: Diagnosis not present

## 2015-01-17 DIAGNOSIS — Z79899 Other long term (current) drug therapy: Secondary | ICD-10-CM | POA: Diagnosis not present

## 2015-01-17 DIAGNOSIS — R413 Other amnesia: Secondary | ICD-10-CM | POA: Diagnosis not present

## 2015-01-17 DIAGNOSIS — R32 Unspecified urinary incontinence: Secondary | ICD-10-CM | POA: Diagnosis not present

## 2015-01-17 DIAGNOSIS — Z853 Personal history of malignant neoplasm of breast: Secondary | ICD-10-CM | POA: Diagnosis not present

## 2015-01-17 DIAGNOSIS — Z856 Personal history of leukemia: Secondary | ICD-10-CM | POA: Diagnosis not present

## 2015-01-17 DIAGNOSIS — D469 Myelodysplastic syndrome, unspecified: Secondary | ICD-10-CM | POA: Diagnosis not present

## 2015-01-31 DIAGNOSIS — R300 Dysuria: Secondary | ICD-10-CM | POA: Diagnosis not present

## 2015-01-31 DIAGNOSIS — D696 Thrombocytopenia, unspecified: Secondary | ICD-10-CM | POA: Diagnosis not present

## 2015-01-31 DIAGNOSIS — L989 Disorder of the skin and subcutaneous tissue, unspecified: Secondary | ICD-10-CM | POA: Diagnosis not present

## 2015-01-31 DIAGNOSIS — R351 Nocturia: Secondary | ICD-10-CM | POA: Diagnosis not present

## 2015-01-31 DIAGNOSIS — R32 Unspecified urinary incontinence: Secondary | ICD-10-CM | POA: Diagnosis not present

## 2015-01-31 DIAGNOSIS — R413 Other amnesia: Secondary | ICD-10-CM | POA: Diagnosis not present

## 2015-01-31 DIAGNOSIS — D469 Myelodysplastic syndrome, unspecified: Secondary | ICD-10-CM | POA: Diagnosis not present

## 2015-01-31 DIAGNOSIS — R2232 Localized swelling, mass and lump, left upper limb: Secondary | ICD-10-CM | POA: Diagnosis not present

## 2015-02-13 DIAGNOSIS — C44311 Basal cell carcinoma of skin of nose: Secondary | ICD-10-CM | POA: Diagnosis not present

## 2015-02-13 DIAGNOSIS — L821 Other seborrheic keratosis: Secondary | ICD-10-CM | POA: Diagnosis not present

## 2015-02-13 DIAGNOSIS — R600 Localized edema: Secondary | ICD-10-CM | POA: Diagnosis not present

## 2015-02-13 DIAGNOSIS — D485 Neoplasm of uncertain behavior of skin: Secondary | ICD-10-CM | POA: Diagnosis not present

## 2015-02-14 DIAGNOSIS — R300 Dysuria: Secondary | ICD-10-CM | POA: Diagnosis not present

## 2015-02-14 DIAGNOSIS — R351 Nocturia: Secondary | ICD-10-CM | POA: Diagnosis not present

## 2015-02-14 DIAGNOSIS — D469 Myelodysplastic syndrome, unspecified: Secondary | ICD-10-CM | POA: Diagnosis not present

## 2015-02-14 DIAGNOSIS — R413 Other amnesia: Secondary | ICD-10-CM | POA: Diagnosis not present

## 2015-02-14 DIAGNOSIS — M25422 Effusion, left elbow: Secondary | ICD-10-CM | POA: Diagnosis not present

## 2015-02-14 DIAGNOSIS — Z856 Personal history of leukemia: Secondary | ICD-10-CM | POA: Diagnosis not present

## 2015-02-14 DIAGNOSIS — Z853 Personal history of malignant neoplasm of breast: Secondary | ICD-10-CM | POA: Diagnosis not present

## 2015-02-14 DIAGNOSIS — R32 Unspecified urinary incontinence: Secondary | ICD-10-CM | POA: Diagnosis not present

## 2015-02-16 DIAGNOSIS — N390 Urinary tract infection, site not specified: Secondary | ICD-10-CM | POA: Diagnosis not present

## 2015-02-16 DIAGNOSIS — R300 Dysuria: Secondary | ICD-10-CM | POA: Diagnosis not present

## 2015-02-28 DIAGNOSIS — R300 Dysuria: Secondary | ICD-10-CM | POA: Diagnosis not present

## 2015-02-28 DIAGNOSIS — D469 Myelodysplastic syndrome, unspecified: Secondary | ICD-10-CM | POA: Diagnosis not present

## 2015-03-14 DIAGNOSIS — R32 Unspecified urinary incontinence: Secondary | ICD-10-CM | POA: Diagnosis not present

## 2015-03-14 DIAGNOSIS — D696 Thrombocytopenia, unspecified: Secondary | ICD-10-CM | POA: Diagnosis not present

## 2015-03-14 DIAGNOSIS — R351 Nocturia: Secondary | ICD-10-CM | POA: Diagnosis not present

## 2015-03-14 DIAGNOSIS — D469 Myelodysplastic syndrome, unspecified: Secondary | ICD-10-CM | POA: Diagnosis not present

## 2015-03-14 DIAGNOSIS — R300 Dysuria: Secondary | ICD-10-CM | POA: Diagnosis not present

## 2015-03-14 DIAGNOSIS — R413 Other amnesia: Secondary | ICD-10-CM | POA: Diagnosis not present

## 2015-03-14 DIAGNOSIS — D649 Anemia, unspecified: Secondary | ICD-10-CM | POA: Diagnosis not present

## 2015-03-28 DIAGNOSIS — R740 Nonspecific elevation of levels of transaminase and lactic acid dehydrogenase [LDH]: Secondary | ICD-10-CM | POA: Diagnosis not present

## 2015-03-28 DIAGNOSIS — T50905A Adverse effect of unspecified drugs, medicaments and biological substances, initial encounter: Secondary | ICD-10-CM | POA: Diagnosis not present

## 2015-03-28 DIAGNOSIS — D61810 Antineoplastic chemotherapy induced pancytopenia: Secondary | ICD-10-CM | POA: Diagnosis not present

## 2015-03-28 DIAGNOSIS — Z853 Personal history of malignant neoplasm of breast: Secondary | ICD-10-CM | POA: Diagnosis not present

## 2015-03-28 DIAGNOSIS — R351 Nocturia: Secondary | ICD-10-CM | POA: Diagnosis not present

## 2015-03-28 DIAGNOSIS — T451X5A Adverse effect of antineoplastic and immunosuppressive drugs, initial encounter: Secondary | ICD-10-CM | POA: Diagnosis not present

## 2015-03-28 DIAGNOSIS — R32 Unspecified urinary incontinence: Secondary | ICD-10-CM | POA: Diagnosis not present

## 2015-03-28 DIAGNOSIS — R300 Dysuria: Secondary | ICD-10-CM | POA: Diagnosis not present

## 2015-03-28 DIAGNOSIS — R05 Cough: Secondary | ICD-10-CM | POA: Diagnosis not present

## 2015-03-28 DIAGNOSIS — D696 Thrombocytopenia, unspecified: Secondary | ICD-10-CM | POA: Diagnosis not present

## 2015-03-28 DIAGNOSIS — L989 Disorder of the skin and subcutaneous tissue, unspecified: Secondary | ICD-10-CM | POA: Diagnosis not present

## 2015-03-28 DIAGNOSIS — Z882 Allergy status to sulfonamides status: Secondary | ICD-10-CM | POA: Diagnosis not present

## 2015-03-28 DIAGNOSIS — Z79899 Other long term (current) drug therapy: Secondary | ICD-10-CM | POA: Diagnosis not present

## 2015-03-28 DIAGNOSIS — Z9221 Personal history of antineoplastic chemotherapy: Secondary | ICD-10-CM | POA: Diagnosis not present

## 2015-03-28 DIAGNOSIS — D469 Myelodysplastic syndrome, unspecified: Secondary | ICD-10-CM | POA: Diagnosis not present

## 2015-03-28 DIAGNOSIS — Z9889 Other specified postprocedural states: Secondary | ICD-10-CM | POA: Diagnosis not present

## 2015-03-28 DIAGNOSIS — Z881 Allergy status to other antibiotic agents status: Secondary | ICD-10-CM | POA: Diagnosis not present

## 2015-03-28 DIAGNOSIS — E039 Hypothyroidism, unspecified: Secondary | ICD-10-CM | POA: Diagnosis not present

## 2015-04-10 DIAGNOSIS — C44311 Basal cell carcinoma of skin of nose: Secondary | ICD-10-CM | POA: Diagnosis not present

## 2015-04-11 DIAGNOSIS — D500 Iron deficiency anemia secondary to blood loss (chronic): Secondary | ICD-10-CM | POA: Diagnosis not present

## 2015-04-11 DIAGNOSIS — D696 Thrombocytopenia, unspecified: Secondary | ICD-10-CM | POA: Diagnosis not present

## 2015-04-11 DIAGNOSIS — D469 Myelodysplastic syndrome, unspecified: Secondary | ICD-10-CM | POA: Diagnosis not present

## 2015-04-17 DIAGNOSIS — D469 Myelodysplastic syndrome, unspecified: Secondary | ICD-10-CM | POA: Diagnosis not present

## 2015-04-18 ENCOUNTER — Observation Stay (HOSPITAL_COMMUNITY)
Admission: EM | Admit: 2015-04-18 | Discharge: 2015-04-20 | Disposition: A | Discharge status: Home / Self Care | Payer: Medicare Other | Attending: Internal Medicine | Admitting: Internal Medicine

## 2015-04-18 ENCOUNTER — Encounter (HOSPITAL_COMMUNITY): Payer: Self-pay | Admitting: Emergency Medicine

## 2015-04-18 DIAGNOSIS — R011 Cardiac murmur, unspecified: Secondary | ICD-10-CM | POA: Insufficient documentation

## 2015-04-18 DIAGNOSIS — Z79899 Other long term (current) drug therapy: Secondary | ICD-10-CM | POA: Insufficient documentation

## 2015-04-18 DIAGNOSIS — D696 Thrombocytopenia, unspecified: Principal | ICD-10-CM | POA: Insufficient documentation

## 2015-04-18 DIAGNOSIS — Q828 Other specified congenital malformations of skin: Secondary | ICD-10-CM | POA: Diagnosis not present

## 2015-04-18 DIAGNOSIS — F419 Anxiety disorder, unspecified: Secondary | ICD-10-CM | POA: Diagnosis not present

## 2015-04-18 DIAGNOSIS — R040 Epistaxis: Secondary | ICD-10-CM | POA: Diagnosis present

## 2015-04-18 DIAGNOSIS — D649 Anemia, unspecified: Secondary | ICD-10-CM | POA: Diagnosis present

## 2015-04-18 DIAGNOSIS — Z853 Personal history of malignant neoplasm of breast: Secondary | ICD-10-CM | POA: Diagnosis not present

## 2015-04-18 DIAGNOSIS — L7621 Postprocedural hemorrhage and hematoma of skin and subcutaneous tissue following a dermatologic procedure: Secondary | ICD-10-CM | POA: Diagnosis not present

## 2015-04-18 DIAGNOSIS — Z856 Personal history of leukemia: Secondary | ICD-10-CM | POA: Diagnosis not present

## 2015-04-18 DIAGNOSIS — Z882 Allergy status to sulfonamides status: Secondary | ICD-10-CM | POA: Diagnosis not present

## 2015-04-18 DIAGNOSIS — C44311 Basal cell carcinoma of skin of nose: Secondary | ICD-10-CM | POA: Diagnosis present

## 2015-04-18 DIAGNOSIS — IMO0002 Reserved for concepts with insufficient information to code with codable children: Secondary | ICD-10-CM | POA: Diagnosis present

## 2015-04-18 DIAGNOSIS — D469 Myelodysplastic syndrome, unspecified: Secondary | ICD-10-CM | POA: Diagnosis present

## 2015-04-18 DIAGNOSIS — Z888 Allergy status to other drugs, medicaments and biological substances status: Secondary | ICD-10-CM | POA: Diagnosis not present

## 2015-04-18 NOTE — ED Notes (Addendum)
Pt had an area skin cancer removed from her nose today at the dermatologist and it has begun bleeding. States that she didn't have any stitches put in it. Also notes her L leg is bleeding where an area was removed as well. Platelets are low. Alert and oriented

## 2015-04-18 NOTE — ED Notes (Signed)
Pt informed Probation officer that she would prefer for blood work to be drawn from [facility] cath, Therapist, sports notified

## 2015-04-18 NOTE — ED Provider Notes (Addendum)
CSN: [id number]     Arrival date & time 04/18/15  2227 History   By signing my name below, I, Forrestine Him, attest that this documentation has been prepared under the direction and in the presence of Orpah Greek, MD.  Electronically Signed: Forrestine Him, ED Scribe. 04/18/2015. 11:51 PM.   Chief Complaint  Patient presents with  . Epistaxis   The history is provided by the patient. No language interpreter was used.    HPI Comments: CRISTINA KOSCINSKI is a 82 y.o. female with a PMHx of CLL, pancytopenia, and breast cancer who presents to the Emergency Department here for an episodes of epistaxis this evening. Pt states she had an area of skin cancer removed from her nose and L leg earlier today at her dermatologist office. Areas have continued to bleed since procedure. Areas tend to bleed more when removing gauze and when touching open wounds. She denies any suture placement after removal. Ms. Mihalovich admits to having a platelet disorder and states her platelets are currently low. No recent fever, chills, nausea, or vomiting.  PCP: Haywood Pao, MD    Past Medical History  Diagnosis Date  . Heart murmur   . CLL (chronic lymphocytic leukemia) (Home)   . Breast cancer ([place])     left   . Pancytopenia ([place])   . Anxiety    Past Surgical History  Procedure Laterality Date  . Breast surgery    . Breast lumpectomy  2010    left  breast  . Vaginal hysterectomy    . Bone marrow needle core biopsy     History reviewed. No pertinent family history. Social History  Substance Use Topics  . Smoking status: Never Smoker   . Smokeless tobacco: Never Used  . Alcohol Use: No   OB History    No data available     Review of Systems  Constitutional: Negative for fever and chills.  Cardiovascular: Negative for chest pain.  Gastrointestinal: Negative for nausea and vomiting.  Skin: Positive for wound.  Neurological: Negative for dizziness and headaches.   Psychiatric/Behavioral: Negative for confusion.  All other systems reviewed and are negative.     Allergies  Influenza vaccines; Lorazepam; Pneumococcal vaccines; and Sulfa antibiotics  Home Medications   Prior to Admission medications   Medication Sig Start Date End Date Taking? Authorizing Provider  Calcium Carbonate-Vitamin D (CALCIUM 600 + D PO) Take 1 tablet by mouth every morning.    Yes Historical Provider, MD  lidocaine-prilocaine (EMLA) cream Apply topically as needed. Apply 1 teaspoon to [facility] site 2 hours prior to stick and cover with plastic wrap to numb site 02/24/12  Yes Ladell Pier, MD  prochlorperazine (COMPAZINE) 10 MG tablet Take 10 mg by mouth every 6 (six) hours as needed. For nausea. 06/18/11  Yes Ladell Pier, MD  Resveratrol 50 MG CAPS Take 50 mg by mouth daily.   Yes Historical Provider, MD  SYNTHROID 100 MCG tablet Take 100 mcg by mouth daily. 02/20/15  Yes Historical Provider, MD  traMADol (ULTRAM) 50 MG tablet Take 50 mg by mouth every 6 (six) hours as needed for moderate pain or severe pain.  04/18/15  Yes Historical Provider, MD  LORazepam (ATIVAN) 1 MG tablet Take 0.5 tablets (0.5 mg total) by mouth once. Patient not taking: Reported on 04/18/2015 02/24/12   Ladell Pier, MD   Triage Vitals: BP 104/55 mmHg  Pulse 74  Temp(Src) 97.9 F (36.6 C) (Oral)  Resp 18  SpO2 94%   Physical Exam  Constitutional: She is oriented to person, place, and time. She appears well-developed and well-nourished. No distress.  HENT:  Head: Normocephalic and atraumatic.  Right Ear: Hearing normal.  Left Ear: Hearing normal.  Nose: Nose normal.  Mouth/Throat: Oropharynx is clear and moist and mucous membranes are normal.  Eyes: Conjunctivae and EOM are normal. Pupils are equal, round, and reactive to light.  Neck: Normal range of motion. Neck supple.  Cardiovascular: Normal rate, regular rhythm, S1 normal, S2 normal and normal heart sounds.  Exam reveals no gallop and no  friction rub.   No murmur heard. Pulmonary/Chest: Effort normal and breath sounds normal. No respiratory distress. She exhibits no tenderness.  Abdominal: Soft. Normal appearance and bowel sounds are normal. There is no hepatosplenomegaly. There is no tenderness. There is no rebound, no guarding, no tenderness at McBurney's point and negative Murphy's sign. No hernia.  Musculoskeletal: Normal range of motion.  Neurological: She is alert and oriented to person, place, and time. She has normal strength. No cranial nerve deficit or sensory deficit. Coordination normal. GCS eye subscore is 4. GCS verbal subscore is 5. GCS motor subscore is 6.  Skin: Skin is warm, dry and intact. No rash noted. No cyanosis.  1.5 cm circular wound to L shin Nose is bandaged with brisk bleeding from the L side of nose area  Psychiatric: She has a normal mood and affect. Her speech is normal and behavior is normal. Thought content normal.  Nursing note and vitals reviewed.   ED Course  Procedures (including critical care time)  DIAGNOSTIC STUDIES: Oxygen Saturation is 95% on RA, Adequate by my interpretation.    COORDINATION OF CARE: 11:37 PM- Will order blood work and PT-INR. Discussed treatment plan with pt at bedside and pt agreed to plan.     Labs Review Labs Reviewed  CBC WITH DIFFERENTIAL/PLATELET - Abnormal; Notable for the following:    WBC 3.6 (*)    RBC 2.27 (*)    Hemoglobin 7.9 (*)    HCT 23.8 (*)    MCV 104.8 (*)    MCH 34.8 (*)    RDW 27.8 (*)    Platelets 53 (*)    Lymphs Abs 0.5 (*)    All other components within normal limits  BASIC METABOLIC PANEL - Abnormal; Notable for the following:    Glucose, Bld 143 (*)    All other components within normal limits  PROTIME-INR - Abnormal; Notable for the following:    Prothrombin Time 15.5 (*)    All other components within normal limits  TYPE AND SCREEN  PREPARE PLATELET PHERESIS    Imaging Review No results found. I have personally  reviewed and evaluated these images and lab results as part of my medical decision-making.   EKG Interpretation None      MDM   Final diagnoses:  Thrombocytopenia (HCC)  Postoperative hemorrhage of skin following dermatologic procedure    Patient presents to the ER for evaluation of bleeding after Mohs surgery. Patient had basal cell carcinoma removed from the left side of her nose and her left lower leg. Patient has a history of mild dysplastic syndrome and resultant pancytopenia. Patient reports that her oncologist, Dr. Florene Glen, worked to get her platelets up above 50 before surgery. She reports that her platelet count was 80 before surgery. Patient went home after surgery and started having brisk bleeding from the nose and leg. She 5 pressure but it did not stop. Patient did  have worsening. Pancytopenia arrival with a platelet count of 53. She tells me that she has follow-up scheduled this afternoon with plastic surgery. She was given a unit of pheresis platelets in an attempt to stop the bleeding and discharge her, but she continues to bleed after transfusion. Patient will require hospitalization for further management of ongoing bleeding.  Discussed with Dr.Gudena, on-call for hematology-oncology. He agrees with additional platelets and will consult on the patient.  I personally performed the services described in this documentation, which was scribed in my presence. The recorded information has been reviewed and is accurate.   Orpah Greek, MD 04/19/15 [id number]  Orpah Greek, MD 04/19/15 [phone]

## 2015-04-19 ENCOUNTER — Encounter (HOSPITAL_COMMUNITY): Payer: Self-pay

## 2015-04-19 DIAGNOSIS — IMO0002 Reserved for concepts with insufficient information to code with codable children: Secondary | ICD-10-CM | POA: Diagnosis present

## 2015-04-19 DIAGNOSIS — D469 Myelodysplastic syndrome, unspecified: Secondary | ICD-10-CM

## 2015-04-19 DIAGNOSIS — D696 Thrombocytopenia, unspecified: Secondary | ICD-10-CM | POA: Diagnosis not present

## 2015-04-19 DIAGNOSIS — L7621 Postprocedural hemorrhage and hematoma of skin and subcutaneous tissue following a dermatologic procedure: Secondary | ICD-10-CM

## 2015-04-19 DIAGNOSIS — C44311 Basal cell carcinoma of skin of nose: Secondary | ICD-10-CM | POA: Diagnosis present

## 2015-04-19 LAB — PROTIME-INR
INR: 1.22 (ref 0.00–1.49)
PROTHROMBIN TIME: 15.5 s — AB (ref 11.6–15.2)

## 2015-04-19 LAB — CBC WITH DIFFERENTIAL/PLATELET
BASOS ABS: 0 10*3/uL (ref 0.0–0.1)
Basophils Relative: 0 %
EOS ABS: 0 10*3/uL (ref 0.0–0.7)
Eosinophils Relative: 0 %
HCT: 23.8 % — ABNORMAL LOW (ref 36.0–46.0)
Hemoglobin: 7.9 g/dL — ABNORMAL LOW (ref 12.0–15.0)
LYMPHS PCT: 14 %
Lymphs Abs: 0.5 10*3/uL — ABNORMAL LOW (ref 0.7–4.0)
MCH: 34.8 pg — ABNORMAL HIGH (ref 26.0–34.0)
MCHC: 33.2 g/dL (ref 30.0–36.0)
MCV: 104.8 fL — ABNORMAL HIGH (ref 78.0–100.0)
Monocytes Absolute: 0.5 10*3/uL (ref 0.1–1.0)
Monocytes Relative: 14 %
NEUTROS PCT: 72 %
Neutro Abs: 2.6 10*3/uL (ref 1.7–7.7)
PLATELETS: 53 10*3/uL — AB (ref 150–400)
RBC: 2.27 MIL/uL — AB (ref 3.87–5.11)
RDW: 27.8 % — ABNORMAL HIGH (ref 11.5–15.5)
WBC MORPHOLOGY: INCREASED
WBC: 3.6 10*3/uL — AB (ref 4.0–10.5)

## 2015-04-19 LAB — BASIC METABOLIC PANEL
ANION GAP: 9 (ref 5–15)
BUN: 14 mg/dL (ref 6–20)
CALCIUM: 9.1 mg/dL (ref 8.9–10.3)
CO2: 27 mmol/L (ref 22–32)
Chloride: 103 mmol/L (ref 101–111)
Creatinine, Ser: 0.56 mg/dL (ref 0.44–1.00)
GFR calc Af Amer: >60 mL/min (ref >60)
Glucose, Bld: 143 mg/dL — ABNORMAL HIGH (ref 65–99)
POTASSIUM: 3.7 mmol/L (ref 3.5–5.1)
SODIUM: 139 mmol/L (ref 135–145)

## 2015-04-19 MED ORDER — ACETAMINOPHEN 325 MG PO TABS: 650.0000 mg | ORAL_TABLET | Freq: Four times a day (QID) | ORAL | Status: DC | PRN

## 2015-04-19 MED ORDER — SODIUM CHLORIDE 0.9 % IV SOLN
10.0000 mL/h | Freq: Once | INTRAVENOUS | Status: AC
  Administered 2015-04-19: 10 mL/h via INTRAVENOUS

## 2015-04-19 MED ORDER — ONDANSETRON HCL 4 MG/2ML IJ SOLN: 4.0000 mg | Freq: Four times a day (QID) | INTRAMUSCULAR | Status: DC | PRN

## 2015-04-19 MED ORDER — MORPHINE SULFATE (PF) 2 MG/ML IV SOLN: 1.0000 mg | INTRAVENOUS | Status: DC | PRN

## 2015-04-19 MED ORDER — SODIUM CHLORIDE 0.9 % IV SOLN: 10.0000 mL/h | Freq: Once | INTRAVENOUS | Status: DC

## 2015-04-19 MED ORDER — ACETAMINOPHEN 650 MG RE SUPP: 650.0000 mg | Freq: Four times a day (QID) | RECTAL | Status: DC | PRN

## 2015-04-19 MED ORDER — HYDROCODONE-ACETAMINOPHEN 5-325 MG PO TABS: 1.0000 | ORAL_TABLET | ORAL | Status: DC | PRN

## 2015-04-19 MED ORDER — ONDANSETRON HCL 4 MG PO TABS: 4.0000 mg | ORAL_TABLET | Freq: Four times a day (QID) | ORAL | Status: DC | PRN

## 2015-04-19 MED ORDER — RESVERATROL 50 MG PO CAPS: 50.0000 mg | ORAL_CAPSULE | Freq: Every day | ORAL | Status: DC

## 2015-04-19 MED ORDER — TRAMADOL HCL 50 MG PO TABS: 50.0000 mg | ORAL_TABLET | Freq: Four times a day (QID) | ORAL | Status: DC | PRN

## 2015-04-19 MED ORDER — SODIUM CHLORIDE 0.9 % IV SOLN
INTRAVENOUS | Status: DC
  Administered 2015-04-19: 13:00:00 via INTRAVENOUS

## 2015-04-19 MED ORDER — LEVOTHYROXINE SODIUM 100 MCG PO TABS
100.0000 ug | ORAL_TABLET | Freq: Every day | ORAL | Status: DC
  Administered 2015-04-20: 100 ug via ORAL
  Filled 2015-04-19: qty 1

## 2015-04-19 NOTE — H&P (Signed)
[facility] History and Physical  Anne Doyle [id number] DOB: 02/12/1928 DOA: 04/18/2015  Referring physician: EDP PCP: Haywood Pao, MD   Chief Complaint: Bleeding from cancer removal site  HPI: Anne Doyle is a 82 y.o. female with past medical history of MDS and from cytopenia and anemia secondary to that. Patient came to the hospital bleeding from [facility] removal site above her right nostril. Patient follows with [facility] system, she had basal cell carcinoma removed from her right nostril yesterday morning and she started bleeding so she came into the hospital for further evaluation. It worth to mention that she does have chronic thrombocytopenia secondary to MDS, she did have 4 units of blood transfused prior to the procedure and reportedly her platelets were in the 80s. In the ED platelets are 54, patient continues ooze blood from the surgical site, she had 1 unit of pheresed platelets transfused earlier, continues to bleed so admitted for further management.  Review of Systems:  Constitutional: negative for anorexia, fevers and sweats Eyes: negative for irritation, redness and visual disturbance Ears, nose, mouth, throat, and face: negative for earaches, epistaxis, nasal congestion and sore throat Respiratory: negative for cough, dyspnea on exertion, sputum and wheezing Cardiovascular: negative for chest pain, dyspnea, lower extremity edema, orthopnea, palpitations and syncope Gastrointestinal: negative for abdominal pain, constipation, diarrhea, melena, nausea and vomiting Genitourinary:negative for dysuria, frequency and hematuria Hematologic/lymphatic: negative for bleeding, easy bruising and lymphadenopathy Musculoskeletal:negative for arthralgias, muscle weakness and stiff joints Neurological: negative for coordination problems, gait problems, headaches and weakness Endocrine: negative for diabetic symptoms including polydipsia, polyuria and weight  loss Allergic/Immunologic: negative for anaphylaxis, hay fever and urticaria  Past Medical History  Diagnosis Date  . Heart murmur   . CLL (chronic lymphocytic leukemia) ([place])   . Breast cancer ([place])     left   . Pancytopenia ([place])   . Anxiety    Past Surgical History  Procedure Laterality Date  . Breast surgery    . Breast lumpectomy  2010    left  breast  . Vaginal hysterectomy    . Bone marrow needle core biopsy     Social History:   reports that she has never smoked. She has never used smokeless tobacco. She reports that she does not drink alcohol or use illicit drugs.  Allergies  Allergen Reactions  . Influenza Vaccines   . Lorazepam Other (See Comments)    Hallucinations  And cant walk  . Pneumococcal Vaccines Other (See Comments)    Arms swell  . Sulfa Antibiotics Other (See Comments)    Flushing face     Family history No history of MDS   Prior to Admission medications   Medication Sig Start Date End Date Taking? Authorizing Provider  Calcium Carbonate-Vitamin D (CALCIUM 600 + D PO) Take 1 tablet by mouth every morning.    Yes Historical Provider, MD  lidocaine-prilocaine (EMLA) cream Apply topically as needed. Apply 1 teaspoon to [facility] site 2 hours prior to stick and cover with plastic wrap to numb site 02/24/12  Yes Ladell Pier, MD  prochlorperazine (COMPAZINE) 10 MG tablet Take 10 mg by mouth every 6 (six) hours as needed. For nausea. 06/18/11  Yes Ladell Pier, MD  Resveratrol 50 MG CAPS Take 50 mg by mouth daily.   Yes Historical Provider, MD  SYNTHROID 100 MCG tablet Take 100 mcg by mouth daily. 02/20/15  Yes Historical Provider, MD  traMADol (ULTRAM) 50 MG tablet Take 50 mg  by mouth every 6 (six) hours as needed for moderate pain or severe pain.  04/18/15  Yes Historical Provider, MD  LORazepam (ATIVAN) 1 MG tablet Take 0.5 tablets (0.5 mg total) by mouth once. Patient not taking: Reported on 04/18/2015 02/24/12   Ladell Pier, MD   Physical  Exam: Filed Vitals:   04/19/15 1000 04/19/15 1030  BP: 130/68 128/69  Pulse: 80 76  Temp: 98.3 F (36.8 C) 98.2 F (36.8 C)  Resp: 20 20   Constitutional: Oriented to person, place, and time. Well-developed and well-nourished. Cooperative.  Head: Normocephalic and atraumatic.  Nose: Nose normal.  Mouth/Throat: Uvula is midline, oropharynx is clear and moist and mucous membranes are normal.  Eyes: Conjunctivae and EOM are normal. Pupils are equal, round, and reactive to light.  Neck: Trachea normal and normal range of motion. Neck supple.  Cardiovascular: Normal rate, regular rhythm, S1 normal, S2 normal, normal heart sounds and intact distal pulses.   Pulmonary/Chest: Effort normal and breath sounds normal.  Abdominal: Soft. Bowel sounds are normal. There is no hepatosplenomegaly. There is no tenderness.  Musculoskeletal: Normal range of motion.  Neurological: Alert and oriented to person, place, and time. Has normal strength. No cranial nerve deficit or sensory deficit.  Skin: Skin is warm, dry and intact.  Psychiatric: Has a normal mood and affect. Speech is normal and behavior is normal.   Labs on Admission:  Basic Metabolic Panel:  Recent Labs Lab 04/19/15 0030  NA 139  K 3.7  CL 103  CO2 27  GLUCOSE 143*  BUN 14  CREATININE 0.56  CALCIUM 9.1   Liver Function Tests: No results for input(s): AST, ALT, ALKPHOS, BILITOT, PROT, ALBUMIN in the last 168 hours. No results for input(s): LIPASE, AMYLASE in the last 168 hours. No results for input(s): AMMONIA in the last 168 hours. CBC:  Recent Labs Lab 04/19/15 0030  WBC 3.6*  NEUTROABS 2.6  HGB 7.9*  HCT 23.8*  MCV 104.8*  PLT 53*   Cardiac Enzymes: No results for input(s): CKTOTAL, CKMB, CKMBINDEX, TROPONINI in the last 168 hours.  BNP (last 3 results) No results for input(s): BNP in the last 8760 hours.  ProBNP (last 3 results) No results for input(s): PROBNP in the last 8760 hours.  CBG: No results for  input(s): GLUCAP in the last 168 hours.  Radiological Exams on Admission: No results found.  EKG: Independently reviewed.  Assessment/Plan Principal Problem:   Post-op bleeding Active Problems:   Anemia   MDS (myelodysplastic syndrome) (HCC)   Basal cell carcinoma of nose    Postoperative bleeding Patient has had removal of basal cell carcinoma from the left side of her nose, start complete after discharge home. Platelets 53, received transfusion of 1 unit of packed RBCs. She still bleeding, per EDP discussed with hematology/oncology and recommended transfusion of another unit of platelets.  MDS Patient is transfusion dependent, hemoglobin is 7.9 and platelets 53. Because of concurrent bleeding hematology recommended transfusion of 2 units of platelets.  Basal cell carcinoma of the nose Status post removal, to follow-up with PRS at [facility].  Anemia and thrombocytopenia Secondary to MDS, follow CBC closely.   Code Status: Full code Family Communication: Plan discussed with the patient in the presence of her husband at bedside. Disposition Plan: Observation  Time spent: 70 minutes  Jahrel Borthwick A, MD [facility] Pager [phone]

## 2015-04-19 NOTE — ED Notes (Signed)
Blood Bank will call when platelets are ready

## 2015-04-19 NOTE — ED Notes (Signed)
Pt ambulated to the bathroom without difficulty.  

## 2015-04-19 NOTE — Progress Notes (Signed)
PHARMACIST - PHYSICIAN ORDER COMMUNICATION  CONCERNING: P&T Medication Policy on Herbal Medications  DESCRIPTION:  This patient's order for:  Resveratrol has been noted.  This product(s) is classified as an "herbal" or natural product. Due to a lack of definitive safety studies or FDA approval, nonstandard manufacturing practices, plus the potential risk of unknown drug-drug interactions while on inpatient medications, the Pharmacy and Therapeutics Committee does not permit the use of "herbal" or natural products of this type within [facility].   ACTION TAKEN: The pharmacy department is unable to verify this order at this time and your patient has been informed of this safety policy. Please reevaluate patient's clinical condition at discharge and address if the herbal or natural product(s) should be resumed at that time.  Gretta Arab PharmD, BCPS Pager [phone] 04/19/2015 11:13 AM

## 2015-04-19 NOTE — ED Notes (Signed)
Nurse upstairs made aware that pt has a second unit of platelets ready in the lab.

## 2015-04-20 DIAGNOSIS — D696 Thrombocytopenia, unspecified: Secondary | ICD-10-CM | POA: Diagnosis not present

## 2015-04-20 DIAGNOSIS — D6189 Other specified aplastic anemias and other bone marrow failure syndromes: Secondary | ICD-10-CM | POA: Diagnosis not present

## 2015-04-20 DIAGNOSIS — L7621 Postprocedural hemorrhage and hematoma of skin and subcutaneous tissue following a dermatologic procedure: Secondary | ICD-10-CM | POA: Diagnosis not present

## 2015-04-20 DIAGNOSIS — C44311 Basal cell carcinoma of skin of nose: Secondary | ICD-10-CM | POA: Diagnosis not present

## 2015-04-20 DIAGNOSIS — D469 Myelodysplastic syndrome, unspecified: Secondary | ICD-10-CM | POA: Diagnosis not present

## 2015-04-20 LAB — PREPARE PLATELET PHERESIS
UNIT DIVISION: 0
Unit division: 0

## 2015-04-20 LAB — BASIC METABOLIC PANEL
ANION GAP: 6 (ref 5–15)
BUN: 13 mg/dL (ref 6–20)
CO2: 30 mmol/L (ref 22–32)
Calcium: 8.9 mg/dL (ref 8.9–10.3)
Chloride: 107 mmol/L (ref 101–111)
Creatinine, Ser: 0.55 mg/dL (ref 0.44–1.00)
GLUCOSE: 132 mg/dL — AB (ref 65–99)
POTASSIUM: 3.9 mmol/L (ref 3.5–5.1)
Sodium: 143 mmol/L (ref 135–145)

## 2015-04-20 LAB — TYPE AND SCREEN
ABO/RH(D): O POS
ANTIBODY SCREEN: NEGATIVE

## 2015-04-20 LAB — CBC
HEMATOCRIT: 22.2 % — AB (ref 36.0–46.0)
HEMOGLOBIN: 7.4 g/dL — AB (ref 12.0–15.0)
MCH: 35.2 pg — ABNORMAL HIGH (ref 26.0–34.0)
MCHC: 33.3 g/dL (ref 30.0–36.0)
MCV: 105.7 fL — ABNORMAL HIGH (ref 78.0–100.0)
Platelets: 54 10*3/uL — ABNORMAL LOW (ref 150–400)
RBC: 2.1 MIL/uL — AB (ref 3.87–5.11)
WBC: 2.4 10*3/uL — ABNORMAL LOW (ref 4.0–10.5)

## 2015-04-20 MED ORDER — HEPARIN SOD (PORK) LOCK FLUSH 100 UNIT/ML IV SOLN
500.0000 [IU] | Freq: Once | INTRAVENOUS | Status: DC
Start: 2015-04-20 — End: 2015-04-20
  Filled 2015-04-20: qty 5

## 2015-04-20 NOTE — Discharge Summary (Signed)
Physician Discharge Summary  Anne Doyle [id number] DOB: February 11, 1928 DOA: 04/18/2015  PCP: Haywood Pao, MD  Admit date: 04/18/2015 Discharge date: 04/20/2015  Time spent: 40 minutes  Recommendations for Outpatient Follow-up:  1. Follow up with primary care physician within one week. 2. Follow-up with facial plastic surgery as soon as possible.   Discharge Diagnoses:  Principal Problem:   Post-op bleeding Active Problems:   Anemia   MDS (myelodysplastic syndrome) (HCC)   Basal cell carcinoma of nose   Discharge Condition: Stable  Diet recommendation: Heart healthy  Filed Weights   04/19/15 0846  Weight: 52.617 kg (116 lb)    History of present illness:  Anne Doyle is a 82 y.o. female with past medical history of MDS and from cytopenia and anemia secondary to that. Patient came to the hospital bleeding from [facility] removal site above her right nostril. Patient follows with [facility] system, she had basal cell carcinoma removed from her right nostril yesterday morning and she started bleeding so she came into the hospital for further evaluation. It worth to mention that she does have chronic thrombocytopenia secondary to MDS, she did have 4 units of blood transfused prior to the procedure and reportedly her platelets were in the 80s. In the ED platelets are 54, patient continues ooze blood from the surgical site, she had 1 unit of pheresed platelets transfused earlier, continues to bleed so admitted for further management.  Hospital Course:   Postoperative bleeding -Patient has had removal of basal cell carcinoma from the left side of her nose, start complete after discharge home. -Platelets 53, received transfusion of 1 unit of platelets in the ED. -This was been discussed with Hem/Onc and they recommended transfusion of another unit of platelets. -Status post transfusion of 2 units of platelets, platelets are 54 today. -I spoke with Dr. Dorthula Nettles who did the  [facility] removal, reported that [place] referral was made at [facility] but patient and her husband declined and they wanted to go to a surgeon of their choice. -I tried the call the daughter Anne Doyle) twice at [phone], and I couldn't reach her. -Currently no bleeding, patient discharged to follow with plastic and reconstructive surgery.  MDS Patient is transfusion dependent, hemoglobin is 7.9 and platelets 53. Because of concurrent bleeding hematology recommended transfusion of 2 units of platelets.  Basal cell carcinoma of the nose Status post removal, to follow-up with PRS at [facility].  Anemia and thrombocytopenia Secondary to MDS, follow CBC closely.   Procedures:  None  Consultations:  None  Discharge Exam: Filed Vitals:   04/19/15 2107 04/20/15 0416  BP: 98/56 130/49  Pulse: 82 79  Temp: 98.3 F (36.8 C) 97.5 F (36.4 C)  Resp: 18 20   General: Alert and awake, oriented x3, not in any acute distress. HEENT: anicteric sclera, pupils reactive to light and accommodation, EOMI CVS: S1-S2 clear, no murmur rubs or gallops Chest: clear to auscultation bilaterally, no wheezing, rales or rhonchi Abdomen: soft nontender, nondistended, normal bowel sounds, no organomegaly Extremities: no cyanosis, clubbing or edema noted bilaterally Neuro: Cranial nerves II-XII intact, no focal neurological deficits Discharge Instructions   Discharge Instructions    Diet - low sodium heart healthy    Complete by:  As directed      Increase activity slowly    Complete by:  As directed           Current Discharge Medication List    CONTINUE these medications which have NOT  CHANGED   Details  Calcium Carbonate-Vitamin D (CALCIUM 600 + D PO) Take 1 tablet by mouth every morning.     lidocaine-prilocaine (EMLA) cream Apply topically as needed. Apply 1 teaspoon to PAC site 2 hours prior to stick and cover with plastic wrap to numb site Qty: 30 g, Refills: prn   Associated Diagnoses: MDS  (myelodysplastic syndrome) (HCC)    prochlorperazine (COMPAZINE) 10 MG tablet Take 10 mg by mouth every 6 (six) hours as needed. For nausea.    Resveratrol 50 MG CAPS Take 50 mg by mouth daily.    SYNTHROID 100 MCG tablet Take 100 mcg by mouth daily.    traMADol (ULTRAM) 50 MG tablet Take 50 mg by mouth every 6 (six) hours as needed for moderate pain or severe pain.     LORazepam (ATIVAN) 1 MG tablet Take 0.5 tablets (0.5 mg total) by mouth once. Qty: 30 tablet, Refills: 0   Associated Diagnoses: MDS (myelodysplastic syndrome) (HCC)       Allergies  Allergen Reactions  . Influenza Vaccines   . Lorazepam Other (See Comments)    Hallucinations  And cant walk  . Pneumococcal Vaccines Other (See Comments)    Arms swell  . Sulfa Antibiotics Other (See Comments)    Flushing face    Follow-up Information    Follow up with Haywood Pao, MD In 1 week.   Specialty:  Internal Medicine   Contact information:   [address] [phone]        The results of significant diagnostics from this hospitalization (including imaging, microbiology, ancillary and laboratory) are listed below for reference.    Significant Diagnostic Studies: No results found.  Microbiology: No results found for this or any previous visit (from the past 240 hour(s)).   Labs: Basic Metabolic Panel:  Recent Labs Lab 04/19/15 0030 04/20/15 0600  NA 139 143  K 3.7 3.9  CL 103 107  CO2 27 30  GLUCOSE 143* 132*  BUN 14 13  CREATININE 0.56 0.55  CALCIUM 9.1 8.9   Liver Function Tests: No results for input(s): AST, ALT, ALKPHOS, BILITOT, PROT, ALBUMIN in the last 168 hours. No results for input(s): LIPASE, AMYLASE in the last 168 hours. No results for input(s): AMMONIA in the last 168 hours. CBC:  Recent Labs Lab 04/19/15 0030 04/20/15 0600  WBC 3.6* 2.4*  NEUTROABS 2.6  --   HGB 7.9* 7.4*  HCT 23.8* 22.2*  MCV 104.8* 105.7*  PLT 53* 54*   Cardiac  Enzymes: No results for input(s): CKTOTAL, CKMB, CKMBINDEX, TROPONINI in the last 168 hours. BNP: BNP (last 3 results) No results for input(s): BNP in the last 8760 hours.  ProBNP (last 3 results) No results for input(s): PROBNP in the last 8760 hours.  CBG: No results for input(s): GLUCAP in the last 168 hours.     Signed:  Birdie Hopes MD.  [facility] 04/20/2015, 11:12 AM

## 2015-04-25 DIAGNOSIS — C9110 Chronic lymphocytic leukemia of B-cell type not having achieved remission: Secondary | ICD-10-CM | POA: Diagnosis not present

## 2015-04-25 DIAGNOSIS — Z888 Allergy status to other drugs, medicaments and biological substances status: Secondary | ICD-10-CM | POA: Diagnosis not present

## 2015-04-25 DIAGNOSIS — L988 Other specified disorders of the skin and subcutaneous tissue: Secondary | ICD-10-CM | POA: Diagnosis not present

## 2015-04-25 DIAGNOSIS — C4491 Basal cell carcinoma of skin, unspecified: Secondary | ICD-10-CM | POA: Diagnosis not present

## 2015-04-25 DIAGNOSIS — Z882 Allergy status to sulfonamides status: Secondary | ICD-10-CM | POA: Diagnosis not present

## 2015-04-25 DIAGNOSIS — R05 Cough: Secondary | ICD-10-CM | POA: Diagnosis not present

## 2015-04-25 DIAGNOSIS — D469 Myelodysplastic syndrome, unspecified: Secondary | ICD-10-CM | POA: Diagnosis not present

## 2015-04-25 DIAGNOSIS — Z887 Allergy status to serum and vaccine status: Secondary | ICD-10-CM | POA: Diagnosis not present

## 2015-04-25 DIAGNOSIS — T50905D Adverse effect of unspecified drugs, medicaments and biological substances, subsequent encounter: Secondary | ICD-10-CM | POA: Diagnosis not present

## 2015-04-25 DIAGNOSIS — R413 Other amnesia: Secondary | ICD-10-CM | POA: Diagnosis not present

## 2015-04-25 DIAGNOSIS — R2232 Localized swelling, mass and lump, left upper limb: Secondary | ICD-10-CM | POA: Diagnosis not present

## 2015-04-25 DIAGNOSIS — Z881 Allergy status to other antibiotic agents status: Secondary | ICD-10-CM | POA: Diagnosis not present

## 2015-04-25 DIAGNOSIS — R740 Nonspecific elevation of levels of transaminase and lactic acid dehydrogenase [LDH]: Secondary | ICD-10-CM | POA: Diagnosis not present

## 2015-04-25 DIAGNOSIS — E039 Hypothyroidism, unspecified: Secondary | ICD-10-CM | POA: Diagnosis not present

## 2015-04-25 DIAGNOSIS — Z853 Personal history of malignant neoplasm of breast: Secondary | ICD-10-CM | POA: Diagnosis not present

## 2015-04-25 DIAGNOSIS — Z9889 Other specified postprocedural states: Secondary | ICD-10-CM | POA: Diagnosis not present

## 2015-04-25 DIAGNOSIS — Z87440 Personal history of urinary (tract) infections: Secondary | ICD-10-CM | POA: Diagnosis not present

## 2015-04-26 DIAGNOSIS — Z681 Body mass index (BMI) 19 or less, adult: Secondary | ICD-10-CM | POA: Diagnosis not present

## 2015-04-26 DIAGNOSIS — S8982XA Other specified injuries of left lower leg, initial encounter: Secondary | ICD-10-CM | POA: Diagnosis not present

## 2015-04-26 DIAGNOSIS — L089 Local infection of the skin and subcutaneous tissue, unspecified: Secondary | ICD-10-CM | POA: Diagnosis not present

## 2015-04-27 DIAGNOSIS — D619 Aplastic anemia, unspecified: Secondary | ICD-10-CM | POA: Diagnosis not present

## 2015-04-27 DIAGNOSIS — D469 Myelodysplastic syndrome, unspecified: Secondary | ICD-10-CM | POA: Diagnosis not present

## 2015-04-27 DIAGNOSIS — S0120XD Unspecified open wound of nose, subsequent encounter: Secondary | ICD-10-CM | POA: Diagnosis not present

## 2015-04-27 DIAGNOSIS — Z85828 Personal history of other malignant neoplasm of skin: Secondary | ICD-10-CM | POA: Diagnosis not present

## 2015-05-01 DIAGNOSIS — Z681 Body mass index (BMI) 19 or less, adult: Secondary | ICD-10-CM | POA: Diagnosis not present

## 2015-05-01 DIAGNOSIS — L089 Local infection of the skin and subcutaneous tissue, unspecified: Secondary | ICD-10-CM | POA: Diagnosis not present

## 2015-05-04 DIAGNOSIS — D469 Myelodysplastic syndrome, unspecified: Secondary | ICD-10-CM | POA: Diagnosis not present

## 2015-05-04 DIAGNOSIS — S81802D Unspecified open wound, left lower leg, subsequent encounter: Secondary | ICD-10-CM | POA: Diagnosis not present

## 2015-05-04 DIAGNOSIS — Z85828 Personal history of other malignant neoplasm of skin: Secondary | ICD-10-CM | POA: Diagnosis not present

## 2015-05-04 DIAGNOSIS — Z862 Personal history of diseases of the blood and blood-forming organs and certain disorders involving the immune mechanism: Secondary | ICD-10-CM | POA: Diagnosis not present

## 2015-05-04 DIAGNOSIS — S0120XD Unspecified open wound of nose, subsequent encounter: Secondary | ICD-10-CM | POA: Diagnosis not present

## 2015-05-09 DIAGNOSIS — Z9889 Other specified postprocedural states: Secondary | ICD-10-CM | POA: Diagnosis not present

## 2015-05-09 DIAGNOSIS — L988 Other specified disorders of the skin and subcutaneous tissue: Secondary | ICD-10-CM | POA: Diagnosis not present

## 2015-05-09 DIAGNOSIS — R413 Other amnesia: Secondary | ICD-10-CM | POA: Diagnosis not present

## 2015-05-09 DIAGNOSIS — D469 Myelodysplastic syndrome, unspecified: Secondary | ICD-10-CM | POA: Diagnosis not present

## 2015-05-09 DIAGNOSIS — Z888 Allergy status to other drugs, medicaments and biological substances status: Secondary | ICD-10-CM | POA: Diagnosis not present

## 2015-05-09 DIAGNOSIS — T424X5D Adverse effect of benzodiazepines, subsequent encounter: Secondary | ICD-10-CM | POA: Diagnosis not present

## 2015-05-09 DIAGNOSIS — Z881 Allergy status to other antibiotic agents status: Secondary | ICD-10-CM | POA: Diagnosis not present

## 2015-05-09 DIAGNOSIS — E039 Hypothyroidism, unspecified: Secondary | ICD-10-CM | POA: Diagnosis not present

## 2015-05-09 DIAGNOSIS — Z887 Allergy status to serum and vaccine status: Secondary | ICD-10-CM | POA: Diagnosis not present

## 2015-05-09 DIAGNOSIS — Z882 Allergy status to sulfonamides status: Secondary | ICD-10-CM | POA: Diagnosis not present

## 2015-05-09 DIAGNOSIS — Z853 Personal history of malignant neoplasm of breast: Secondary | ICD-10-CM | POA: Diagnosis not present

## 2015-05-09 DIAGNOSIS — Z87440 Personal history of urinary (tract) infections: Secondary | ICD-10-CM | POA: Diagnosis not present

## 2015-05-18 DIAGNOSIS — S0120XD Unspecified open wound of nose, subsequent encounter: Secondary | ICD-10-CM | POA: Diagnosis not present

## 2015-05-18 DIAGNOSIS — S81802D Unspecified open wound, left lower leg, subsequent encounter: Secondary | ICD-10-CM | POA: Diagnosis not present

## 2015-05-18 DIAGNOSIS — Z85828 Personal history of other malignant neoplasm of skin: Secondary | ICD-10-CM | POA: Diagnosis not present

## 2015-05-30 DIAGNOSIS — D696 Thrombocytopenia, unspecified: Secondary | ICD-10-CM | POA: Diagnosis not present

## 2015-05-30 DIAGNOSIS — Z882 Allergy status to sulfonamides status: Secondary | ICD-10-CM | POA: Diagnosis not present

## 2015-05-30 DIAGNOSIS — Z881 Allergy status to other antibiotic agents status: Secondary | ICD-10-CM | POA: Diagnosis not present

## 2015-05-30 DIAGNOSIS — Z888 Allergy status to other drugs, medicaments and biological substances status: Secondary | ICD-10-CM | POA: Diagnosis not present

## 2015-05-30 DIAGNOSIS — R32 Unspecified urinary incontinence: Secondary | ICD-10-CM | POA: Diagnosis not present

## 2015-05-30 DIAGNOSIS — R197 Diarrhea, unspecified: Secondary | ICD-10-CM | POA: Diagnosis not present

## 2015-05-30 DIAGNOSIS — R413 Other amnesia: Secondary | ICD-10-CM | POA: Diagnosis not present

## 2015-05-30 DIAGNOSIS — Z923 Personal history of irradiation: Secondary | ICD-10-CM | POA: Diagnosis not present

## 2015-05-30 DIAGNOSIS — Z9221 Personal history of antineoplastic chemotherapy: Secondary | ICD-10-CM | POA: Diagnosis not present

## 2015-05-30 DIAGNOSIS — J3489 Other specified disorders of nose and nasal sinuses: Secondary | ICD-10-CM | POA: Diagnosis not present

## 2015-05-30 DIAGNOSIS — Z853 Personal history of malignant neoplasm of breast: Secondary | ICD-10-CM | POA: Diagnosis not present

## 2015-05-30 DIAGNOSIS — Z856 Personal history of leukemia: Secondary | ICD-10-CM | POA: Diagnosis not present

## 2015-05-30 DIAGNOSIS — R300 Dysuria: Secondary | ICD-10-CM | POA: Diagnosis not present

## 2015-05-30 DIAGNOSIS — D469 Myelodysplastic syndrome, unspecified: Secondary | ICD-10-CM | POA: Diagnosis not present

## 2015-05-30 DIAGNOSIS — R2232 Localized swelling, mass and lump, left upper limb: Secondary | ICD-10-CM | POA: Diagnosis not present

## 2015-05-30 DIAGNOSIS — Z85828 Personal history of other malignant neoplasm of skin: Secondary | ICD-10-CM | POA: Diagnosis not present

## 2015-05-30 DIAGNOSIS — Z79899 Other long term (current) drug therapy: Secondary | ICD-10-CM | POA: Diagnosis not present

## 2015-05-30 DIAGNOSIS — S81819D Laceration without foreign body, unspecified lower leg, subsequent encounter: Secondary | ICD-10-CM | POA: Diagnosis not present

## 2015-05-30 DIAGNOSIS — Z87440 Personal history of urinary (tract) infections: Secondary | ICD-10-CM | POA: Diagnosis not present

## 2015-05-30 DIAGNOSIS — R351 Nocturia: Secondary | ICD-10-CM | POA: Diagnosis not present

## 2015-05-30 DIAGNOSIS — Z9889 Other specified postprocedural states: Secondary | ICD-10-CM | POA: Diagnosis not present

## 2015-05-30 DIAGNOSIS — E039 Hypothyroidism, unspecified: Secondary | ICD-10-CM | POA: Diagnosis not present

## 2015-06-14 DIAGNOSIS — M950 Acquired deformity of nose: Secondary | ICD-10-CM | POA: Diagnosis not present

## 2015-06-14 DIAGNOSIS — Z85828 Personal history of other malignant neoplasm of skin: Secondary | ICD-10-CM | POA: Diagnosis not present

## 2015-06-14 DIAGNOSIS — L905 Scar conditions and fibrosis of skin: Secondary | ICD-10-CM | POA: Diagnosis not present

## 2015-06-20 DIAGNOSIS — D469 Myelodysplastic syndrome, unspecified: Secondary | ICD-10-CM | POA: Diagnosis not present

## 2015-07-11 DIAGNOSIS — D469 Myelodysplastic syndrome, unspecified: Secondary | ICD-10-CM | POA: Diagnosis not present

## 2015-07-11 DIAGNOSIS — R413 Other amnesia: Secondary | ICD-10-CM | POA: Diagnosis not present

## 2015-09-19 DIAGNOSIS — D469 Myelodysplastic syndrome, unspecified: Secondary | ICD-10-CM | POA: Diagnosis not present

## 2015-10-27 DIAGNOSIS — R7881 Bacteremia: Secondary | ICD-10-CM | POA: Diagnosis not present

## 2015-10-27 DIAGNOSIS — R300 Dysuria: Secondary | ICD-10-CM | POA: Diagnosis not present

## 2015-10-31 DIAGNOSIS — T50905A Adverse effect of unspecified drugs, medicaments and biological substances, initial encounter: Secondary | ICD-10-CM | POA: Diagnosis not present

## 2015-10-31 DIAGNOSIS — Z856 Personal history of leukemia: Secondary | ICD-10-CM | POA: Diagnosis not present

## 2015-10-31 DIAGNOSIS — Z9889 Other specified postprocedural states: Secondary | ICD-10-CM | POA: Diagnosis not present

## 2015-10-31 DIAGNOSIS — E039 Hypothyroidism, unspecified: Secondary | ICD-10-CM | POA: Diagnosis not present

## 2015-10-31 DIAGNOSIS — Z923 Personal history of irradiation: Secondary | ICD-10-CM | POA: Diagnosis not present

## 2015-10-31 DIAGNOSIS — N390 Urinary tract infection, site not specified: Secondary | ICD-10-CM | POA: Diagnosis not present

## 2015-10-31 DIAGNOSIS — R351 Nocturia: Secondary | ICD-10-CM | POA: Diagnosis not present

## 2015-10-31 DIAGNOSIS — Z86000 Personal history of in-situ neoplasm of breast: Secondary | ICD-10-CM | POA: Diagnosis not present

## 2015-10-31 DIAGNOSIS — R413 Other amnesia: Secondary | ICD-10-CM | POA: Diagnosis not present

## 2015-10-31 DIAGNOSIS — L989 Disorder of the skin and subcutaneous tissue, unspecified: Secondary | ICD-10-CM | POA: Diagnosis not present

## 2015-10-31 DIAGNOSIS — R197 Diarrhea, unspecified: Secondary | ICD-10-CM | POA: Diagnosis not present

## 2015-10-31 DIAGNOSIS — Z79899 Other long term (current) drug therapy: Secondary | ICD-10-CM | POA: Diagnosis not present

## 2015-10-31 DIAGNOSIS — D469 Myelodysplastic syndrome, unspecified: Secondary | ICD-10-CM | POA: Diagnosis not present

## 2015-10-31 DIAGNOSIS — B9561 Methicillin susceptible Staphylococcus aureus infection as the cause of diseases classified elsewhere: Secondary | ICD-10-CM | POA: Diagnosis not present

## 2015-11-03 DIAGNOSIS — D469 Myelodysplastic syndrome, unspecified: Secondary | ICD-10-CM | POA: Diagnosis not present

## 2015-11-08 ENCOUNTER — Emergency Department (HOSPITAL_COMMUNITY): Payer: Medicare Other

## 2015-11-08 ENCOUNTER — Encounter (HOSPITAL_COMMUNITY): Payer: Self-pay | Admitting: Emergency Medicine

## 2015-11-08 ENCOUNTER — Emergency Department (HOSPITAL_COMMUNITY)
Admission: EM | Admit: 2015-11-08 | Discharge: 2015-11-08 | Disposition: A | Discharge status: Other Acute Inpatient Hospital | Payer: Medicare Other | Attending: Emergency Medicine | Admitting: Emergency Medicine

## 2015-11-08 DIAGNOSIS — R569 Unspecified convulsions: Secondary | ICD-10-CM | POA: Diagnosis not present

## 2015-11-08 DIAGNOSIS — F0390 Unspecified dementia without behavioral disturbance: Secondary | ICD-10-CM | POA: Diagnosis present

## 2015-11-08 DIAGNOSIS — G253 Myoclonus: Secondary | ICD-10-CM | POA: Diagnosis present

## 2015-11-08 DIAGNOSIS — M5489 Other dorsalgia: Secondary | ICD-10-CM | POA: Diagnosis not present

## 2015-11-08 DIAGNOSIS — D703 Neutropenia due to infection: Secondary | ICD-10-CM | POA: Diagnosis present

## 2015-11-08 DIAGNOSIS — R609 Edema, unspecified: Secondary | ICD-10-CM | POA: Diagnosis not present

## 2015-11-08 DIAGNOSIS — A419 Sepsis, unspecified organism: Secondary | ICD-10-CM | POA: Diagnosis present

## 2015-11-08 DIAGNOSIS — R278 Other lack of coordination: Secondary | ICD-10-CM | POA: Diagnosis not present

## 2015-11-08 DIAGNOSIS — R52 Pain, unspecified: Secondary | ICD-10-CM | POA: Diagnosis not present

## 2015-11-08 DIAGNOSIS — I4901 Ventricular fibrillation: Secondary | ICD-10-CM | POA: Diagnosis not present

## 2015-11-08 DIAGNOSIS — R488 Other symbolic dysfunctions: Secondary | ICD-10-CM | POA: Diagnosis not present

## 2015-11-08 DIAGNOSIS — R7881 Bacteremia: Secondary | ICD-10-CM | POA: Diagnosis not present

## 2015-11-08 DIAGNOSIS — D709 Neutropenia, unspecified: Secondary | ICD-10-CM | POA: Diagnosis not present

## 2015-11-08 DIAGNOSIS — R269 Unspecified abnormalities of gait and mobility: Secondary | ICD-10-CM | POA: Diagnosis not present

## 2015-11-08 DIAGNOSIS — M6281 Muscle weakness (generalized): Secondary | ICD-10-CM | POA: Diagnosis not present

## 2015-11-08 DIAGNOSIS — R109 Unspecified abdominal pain: Secondary | ICD-10-CM | POA: Diagnosis present

## 2015-11-08 DIAGNOSIS — G936 Cerebral edema: Secondary | ICD-10-CM | POA: Diagnosis present

## 2015-11-08 DIAGNOSIS — R9431 Abnormal electrocardiogram [ECG] [EKG]: Secondary | ICD-10-CM | POA: Diagnosis not present

## 2015-11-08 DIAGNOSIS — N390 Urinary tract infection, site not specified: Secondary | ICD-10-CM | POA: Insufficient documentation

## 2015-11-08 DIAGNOSIS — D329 Benign neoplasm of meninges, unspecified: Secondary | ICD-10-CM | POA: Diagnosis not present

## 2015-11-08 DIAGNOSIS — Z853 Personal history of malignant neoplasm of breast: Secondary | ICD-10-CM | POA: Insufficient documentation

## 2015-11-08 DIAGNOSIS — J9811 Atelectasis: Secondary | ICD-10-CM | POA: Diagnosis not present

## 2015-11-08 DIAGNOSIS — Z79899 Other long term (current) drug therapy: Secondary | ICD-10-CM | POA: Diagnosis not present

## 2015-11-08 DIAGNOSIS — I4891 Unspecified atrial fibrillation: Secondary | ICD-10-CM | POA: Diagnosis not present

## 2015-11-08 DIAGNOSIS — G939 Disorder of brain, unspecified: Secondary | ICD-10-CM | POA: Diagnosis not present

## 2015-11-08 DIAGNOSIS — R41841 Cognitive communication deficit: Secondary | ICD-10-CM | POA: Diagnosis not present

## 2015-11-08 DIAGNOSIS — R531 Weakness: Secondary | ICD-10-CM

## 2015-11-08 DIAGNOSIS — R2689 Other abnormalities of gait and mobility: Secondary | ICD-10-CM | POA: Diagnosis not present

## 2015-11-08 DIAGNOSIS — D649 Anemia, unspecified: Secondary | ICD-10-CM | POA: Diagnosis not present

## 2015-11-08 DIAGNOSIS — R5081 Fever presenting with conditions classified elsewhere: Secondary | ICD-10-CM | POA: Diagnosis not present

## 2015-11-08 DIAGNOSIS — R260 Ataxic gait: Secondary | ICD-10-CM | POA: Diagnosis not present

## 2015-11-08 DIAGNOSIS — Z856 Personal history of leukemia: Secondary | ICD-10-CM | POA: Diagnosis not present

## 2015-11-08 DIAGNOSIS — D696 Thrombocytopenia, unspecified: Secondary | ICD-10-CM | POA: Diagnosis not present

## 2015-11-08 DIAGNOSIS — D469 Myelodysplastic syndrome, unspecified: Secondary | ICD-10-CM | POA: Diagnosis not present

## 2015-11-08 DIAGNOSIS — D704 Cyclic neutropenia: Secondary | ICD-10-CM | POA: Diagnosis not present

## 2015-11-08 DIAGNOSIS — I480 Paroxysmal atrial fibrillation: Secondary | ICD-10-CM | POA: Diagnosis not present

## 2015-11-08 DIAGNOSIS — E039 Hypothyroidism, unspecified: Secondary | ICD-10-CM | POA: Diagnosis not present

## 2015-11-08 DIAGNOSIS — D61818 Other pancytopenia: Secondary | ICD-10-CM | POA: Diagnosis present

## 2015-11-08 DIAGNOSIS — B9689 Other specified bacterial agents as the cause of diseases classified elsewhere: Secondary | ICD-10-CM | POA: Diagnosis not present

## 2015-11-08 DIAGNOSIS — N281 Cyst of kidney, acquired: Secondary | ICD-10-CM | POA: Diagnosis not present

## 2015-11-08 LAB — COMPREHENSIVE METABOLIC PANEL
ALBUMIN: 4 g/dL (ref 3.5–5.0)
ALK PHOS: 50 U/L (ref 38–126)
ALT: 38 U/L (ref 14–54)
ANION GAP: 10 (ref 5–15)
AST: 39 U/L (ref 15–41)
BILIRUBIN TOTAL: 4 mg/dL — AB (ref 0.3–1.2)
BUN: 25 mg/dL — AB (ref 6–20)
CALCIUM: 8.9 mg/dL (ref 8.9–10.3)
CO2: 28 mmol/L (ref 22–32)
Chloride: 97 mmol/L — ABNORMAL LOW (ref 101–111)
Creatinine, Ser: 0.55 mg/dL (ref 0.44–1.00)
GFR calc Af Amer: >60 mL/min (ref >60)
GLUCOSE: 175 mg/dL — AB (ref 65–99)
Potassium: 3.8 mmol/L (ref 3.5–5.1)
Sodium: 135 mmol/L (ref 135–145)
TOTAL PROTEIN: 6.3 g/dL — AB (ref 6.5–8.1)

## 2015-11-08 LAB — CBC WITH DIFFERENTIAL/PLATELET
BASOS ABS: 0 10*3/uL (ref 0.0–0.1)
Basophils Relative: 2 %
EOS ABS: 0 10*3/uL (ref 0.0–0.7)
Eosinophils Relative: 0 %
HCT: 20.3 % — ABNORMAL LOW (ref 36.0–46.0)
Hemoglobin: 6.8 g/dL — CL (ref 12.0–15.0)
LYMPHS ABS: 0.2 10*3/uL — AB (ref 0.7–4.0)
Lymphocytes Relative: 24 %
MCH: 36.8 pg — ABNORMAL HIGH (ref 26.0–34.0)
MCHC: 33.5 g/dL (ref 30.0–36.0)
MCV: 109.7 fL — ABNORMAL HIGH (ref 78.0–100.0)
Monocytes Absolute: 0.1 10*3/uL (ref 0.1–1.0)
Monocytes Relative: 10 %
NEUTROS ABS: 0.4 10*3/uL — AB (ref 1.7–7.7)
Neutrophils Relative %: 64 %
PLATELETS: 12 10*3/uL — AB (ref 150–400)
RBC: 1.85 MIL/uL — AB (ref 3.87–5.11)
RDW: 23.3 % — AB (ref 11.5–15.5)
WBC: 0.7 10*3/uL — AB (ref 4.0–10.5)

## 2015-11-08 LAB — URINALYSIS, ROUTINE W REFLEX MICROSCOPIC
GLUCOSE, UA: NEGATIVE mg/dL
Ketones, ur: NEGATIVE mg/dL
NITRITE: NEGATIVE
PH: 6 (ref 5.0–8.0)
Protein, ur: 30 mg/dL — AB
SPECIFIC GRAVITY, URINE: 1.021 (ref 1.005–1.030)

## 2015-11-08 LAB — PREPARE RBC (CROSSMATCH)

## 2015-11-08 LAB — URINE MICROSCOPIC-ADD ON

## 2015-11-08 LAB — I-STAT CG4 LACTIC ACID, ED
LACTIC ACID, VENOUS: 1.9 mmol/L (ref 0.5–1.9)
Lactic Acid, Venous: 1.82 mmol/L (ref 0.5–1.9)

## 2015-11-08 MED ORDER — ACETAMINOPHEN 325 MG PO TABS
650.0000 mg | ORAL_TABLET | Freq: Once | ORAL | Status: AC
  Administered 2015-11-08: 650 mg via ORAL
  Filled 2015-11-08: qty 2

## 2015-11-08 MED ORDER — DEXTROSE 5 % IV SOLN
1.0000 g | Freq: Once | INTRAVENOUS | Status: DC
  Administered 2015-11-08: 1 g via INTRAVENOUS
  Filled 2015-11-08: qty 10

## 2015-11-08 MED ORDER — SODIUM CHLORIDE 0.9 % IV SOLN
1000.0000 mL | INTRAVENOUS | Status: DC
  Administered 2015-11-08 (×2): 1000 mL via INTRAVENOUS

## 2015-11-08 MED ORDER — SODIUM CHLORIDE 0.9 % IV BOLUS (SEPSIS): 1000.0000 mL | Freq: Once | INTRAVENOUS | Status: DC

## 2015-11-08 MED ORDER — CEFEPIME HCL 1 G IJ SOLR
1.0000 g | INTRAMUSCULAR | Status: DC
  Administered 2015-11-08: 1 g via INTRAVENOUS
  Filled 2015-11-08: qty 1

## 2015-11-08 MED ORDER — SODIUM CHLORIDE 0.9 % IV SOLN: 10.0000 mL/h | Freq: Once | INTRAVENOUS | Status: DC

## 2015-11-08 NOTE — ED Notes (Signed)
Call placed to [facility] and Neoma Laming, Therapist, sports

## 2015-11-08 NOTE — ED Provider Notes (Signed)
[place] DEPT Provider Note   CSN: [id number] Arrival date & time: 11/08/15  1018     History   Chief Complaint Chief Complaint  Patient presents with  . Flank Pain    HPI Anne Doyle is a 82 y.o. female. Who presents for increasing weakness, with back pain and suspected urinary tract infection. Her husband reports that she was stretching in the bed today and hurt her back, and he feels that this caused her back pain. She was treated for a UTI couple weeks ago, completed treatment and now he suspects that's returned because she is weak. She has had decreased appetite for 3 days, and is not eating hardly anything up by his report. The patient cannot contribute to history.  Level V caveat- altered mental status  HPI  Past Medical History:  Diagnosis Date  . Anxiety   . Breast cancer ([place])    left   . CLL (chronic lymphocytic leukemia) ([place])   . Heart murmur   . Pancytopenia [facility])     Patient Active Problem List   Diagnosis Date Noted  . Post-op bleeding 04/19/2015  . Basal cell carcinoma of nose 04/19/2015  . MDS (myelodysplastic syndrome) ([place]) 02/25/2011  . Anemia 02/20/2011    Past Surgical History:  Procedure Laterality Date  . bone marrow needle core biopsy    . BREAST LUMPECTOMY  2010   left  breast  . BREAST SURGERY    . VAGINAL HYSTERECTOMY      OB History    No data available       Home Medications    Prior to Admission medications   Medication Sig Start Date End Date Taking? Authorizing Provider  acetaminophen (TYLENOL) 325 MG tablet Take 650 mg by mouth every 6 (six) hours as needed for moderate pain or headache.   Yes Historical Provider, MD  CALCIUM PO Take 1 tablet by mouth every other day.   Yes Historical Provider, MD  Cholecalciferol (VITAMIN D PO) Take 1 capsule by mouth every other day.   Yes Historical Provider, MD  Glucos-Chond-Hyal Ac-Ca Fructo (MOVE FREE JOINT HEALTH ADVANCE) TABS Take 1 tablet by mouth every other day.    Yes Historical Provider, MD  ibuprofen (ADVIL,MOTRIN) 200 MG tablet Take 200 mg by mouth daily as needed for headache or moderate pain.   Yes Historical Provider, MD  levothyroxine (SYNTHROID, LEVOTHROID) 100 MCG tablet Take 100 mcg by mouth daily before breakfast.   Yes Historical Provider, MD  lidocaine-prilocaine (EMLA) cream Apply topically as needed. Apply 1 teaspoon to [facility] site 2 hours prior to stick and cover with plastic wrap to numb site 02/24/12  Yes Ladell Pier, MD  MELATONIN PO Take 1 tablet by mouth at bedtime as needed (sleep).   Yes Historical Provider, MD  Probiotic Product (PROBIOTIC PO) Take 1 capsule by mouth daily.   Yes Historical Provider, MD    Family History No family history on file.  Social History Social History  Substance Use Topics  . Smoking status: Never Smoker  . Smokeless tobacco: Never Used  . Alcohol use No     Allergies   Influenza vaccines; Lorazepam; Pneumococcal vaccines; and Sulfa antibiotics   Review of Systems Review of Systems  Unable to perform ROS: Mental status change     Physical Exam Updated Vital Signs BP 122/57 (BP Location: Left Arm)   Pulse 108   Temp 101 F (38.3 C) (Oral)   Resp 16   Ht 5\' 4"  (1.626  m)   Wt 120 lb (54.4 kg)   SpO2 94%   BMI 20.60 kg/m   Physical Exam  Constitutional: She appears well-developed. No distress.  Elderly, frail  HENT:  Head: Normocephalic and atraumatic.  Pale conjunctiva  Eyes: Conjunctivae and EOM are normal. Pupils are equal, round, and reactive to light.  Neck: Normal range of motion and phonation normal. Neck supple.  Cardiovascular: Normal rate and regular rhythm.   Pulmonary/Chest: Effort normal and breath sounds normal. She exhibits no tenderness.  Abdominal: Soft. She exhibits no distension. There is no tenderness. There is no guarding.  Musculoskeletal: Normal range of motion.  Neurological: She is alert. She exhibits normal muscle tone.  Skin: Skin is warm and dry.  No rash noted. No erythema. There is pallor.  Psychiatric: She has a normal mood and affect. Her behavior is normal.  Nursing note and vitals reviewed.    ED Treatments / Results  Labs (all labs ordered are listed, but only abnormal results are displayed) Labs Reviewed  COMPREHENSIVE METABOLIC PANEL - Abnormal; Notable for the following:       Result Value   Chloride 97 (*)    Glucose, Bld 175 (*)    BUN 25 (*)    Total Protein 6.3 (*)    Total Bilirubin 4.0 (*)    All other components within normal limits  CBC WITH DIFFERENTIAL/PLATELET - Abnormal; Notable for the following:    WBC 0.7 (*)    RBC 1.85 (*)    Hemoglobin 6.8 (*)    HCT 20.3 (*)    MCV 109.7 (*)    MCH 36.8 (*)    RDW 23.3 (*)    Platelets 12 (*)    Neutro Abs 0.4 (*)    Lymphs Abs 0.2 (*)    All other components within normal limits  URINALYSIS, ROUTINE W REFLEX MICROSCOPIC (NOT AT [facility]) - Abnormal; Notable for the following:    Color, Urine ORANGE (*)    APPearance CLOUDY (*)    Hgb urine dipstick MODERATE (*)    Bilirubin Urine SMALL (*)    Protein, ur 30 (*)    Leukocytes, UA TRACE (*)    All other components within normal limits  URINE MICROSCOPIC-ADD ON - Abnormal; Notable for the following:    Squamous Epithelial / LPF 0-5 (*)    Bacteria, UA MANY (*)    Casts GRANULAR CAST (*)    All other components within normal limits  CULTURE, BLOOD (ROUTINE X 2)  CULTURE, BLOOD (ROUTINE X 2)  URINE CULTURE  URINE CULTURE  I-STAT CG4 LACTIC ACID, ED  PREPARE RBC (CROSSMATCH)    EKG  EKG Interpretation None       Radiology Dg Chest Port 1 View  Result Date: 11/08/2015 CLINICAL DATA:  Flank pain.  Generalized weakness. EXAM: PORTABLE CHEST 1 VIEW COMPARISON:  08/19/2011 FINDINGS: There is a right chest wall port a catheter with tip in the right atrium. Mild cardiac enlargement. Aortic atherosclerosis identified. There is mild diffuse pulmonary edema. Atelectasis is identified within the left base.  IMPRESSION: 1. Mild pulmonary edema. 2. Left base atelectasis. Electronically Signed   By: Kerby Moors M.D.   On: 11/08/2015 11:30    Procedures Procedures (including critical care time)  Medications Ordered in ED Medications  0.9 %  sodium chloride infusion (1,000 mLs Intravenous New Bag/Given 11/08/15 1159)  acetaminophen (TYLENOL) tablet 650 mg (not administered)  cefTRIAXone (ROCEPHIN) 1 g in dextrose 5 % 50 mL IVPB (not  administered)  0.9 %  sodium chloride infusion (not administered)     Initial Impression / Assessment and Plan / ED Course  I have reviewed the triage vital signs and the nursing notes.  Pertinent labs & imaging results that were available during my care of the patient were reviewed by me and considered in my medical decision making (see chart for details).  Clinical Course    Medications  0.9 %  sodium chloride infusion (1,000 mLs Intravenous New Bag/Given 11/08/15 1159)  acetaminophen (TYLENOL) tablet 650 mg (not administered)  cefTRIAXone (ROCEPHIN) 1 g in dextrose 5 % 50 mL IVPB (not administered)  0.9 %  sodium chloride infusion (not administered)    Patient Vitals for the past 24 hrs:  BP Temp Temp src Pulse Resp SpO2 Height Weight  11/08/15 1528 122/57 101 F (38.3 C) Oral 108 16 94 % - -  11/08/15 1500 (!) 118/48 - - - - - - -  11/08/15 1430 (!) 114/48 - - - - - - -  11/08/15 1400 (!) 111/52 - - - - - - -  11/08/15 1252 130/55 - - 108 16 94 % - -  11/08/15 1035 (!) 135/45 98.5 F (36.9 C) Oral - 16 94 % 5\' 4"  (1.626 m) 120 lb (54.4 kg)   15:25- case was discussed with her hematologist, Dr. Florene Glen at [facility] health.He offered to transfuse the patient at his clinic, but understands that she might be too ill for that at this time. He states that her myelodysplastic disorder is progressing. He agrees with hospitalization, treatment for UTI and blood transfusion. He states that the patient can be managed here at this facility.  3:35 PM  Reevaluation with update and discussion. After initial assessment and treatment, an updated evaluation reveals patient complains of back pain, and now has a fever. Findings discussed with patient and husband, all questions answered. They agree with hospitalization here for treatment. Charleton Deyoung L   3:38 PM-Consult complete with Hospitalist, Dr. Erlinda Hong. Patient case explained and discussed. She is uncomfortable admitting this patient, who has neutropenia and leukopenia, and recommends that the patient be transferred to [facility] for further evaluation and treatment. Call ended at 15:50  16:05- case again discussed with Dr. Florene Glen, at [facility] health. He agrees to have the patient admitted to their facility, where they can help to manage her care. He requests that she be started on cefepime. Nursing informed to discontinue Rocephin ordered, and initiate cefepime.  CRITICAL CARE Performed by: Richarda Blade Total critical care time: 35 minutes Critical care time was exclusive of separately billable procedures and treating other patients. Critical care was necessary to treat or prevent imminent or life-threatening deterioration. Critical care was time spent personally by me on the following activities: development of treatment plan with patient and/or surrogate as well as nursing, discussions with consultants, evaluation of patient's response to treatment, examination of patient, obtaining history from patient or surrogate, ordering and performing treatments and interventions, ordering and review of laboratory studies, ordering and review of radiographic studies, pulse oximetry and re-evaluation of patient's condition.    Final Clinical Impressions(s) / ED Diagnoses   Final diagnoses:  Lower urinary tract infectious disease  Other pancytopenia ([place])  Weakness    Patient presenting with nonspecific symptoms, recent UTI and weakness. Evaluation consistent with urinary tract  infection, worsening pancytopenia, and nonspecific back pain. She will require hospitalization for treatment including IV antibiotics, red blood cell transfusion, and observation for possible  additional treatment.  Nursing Notes Reviewed/ Care Coordinated, and agree without changes. Applicable Imaging Reviewed.  Interpretation of Laboratory Data incorporated into ED treatment  Plan: Admit/ Transfer to [facility] when bed becomes available.  New Prescriptions New Prescriptions   No medications on file     Daleen Bo, MD 11/08/15 2107

## 2015-11-08 NOTE — ED Triage Notes (Signed)
Per EmS, pt comes from home. Husband called EMS because he could not get pt out of bed this morning td/t flank pain and generalized weakness.  Pt was diagnosed with a UTI on 10/4 and finished her antibiotics 10/16 but states that she is still having pain and urinary frenquency. Pt is alert and oriented x 4.  BP: 102/72 HR: 100 98% on 3L CBG 190 22 G in left forearm.

## 2015-11-08 NOTE — Progress Notes (Signed)
Pharmacy Antibiotic Note  Anne Doyle is a 82 y.o. female admitted on 11/08/2015 with UTI, having completed and failed oral antibiotics.  Pharmacy has been consulted for cefepime dosing.  Plan:  Cefepime 1 g IV q24 hr  Height: 5\' 4"  (162.6 cm) Weight: 120 lb (54.4 kg) IBW/kg (Calculated) : 54.7  Temp (24hrs), Avg:99.8 F (37.7 C), Min:98.5 F (36.9 C), Max:101 F (38.3 C)   Recent Labs Lab 11/08/15 1140 11/08/15 1154 11/08/15 1616  WBC 0.7*  --   --   CREATININE 0.55  --   --   LATICACIDVEN  --  1.82 1.90    Estimated Creatinine Clearance: 45 mL/min (by C-G formula based on SCr of 0.55 mg/dL).    Allergies  Allergen Reactions  . Influenza Vaccines     Arm swelling  . Lorazepam Other (See Comments)    Hallucinations  And cant walk  . Pneumococcal Vaccines Other (See Comments)    Arms swell  . Sulfa Antibiotics Other (See Comments)    Flushing face     Antimicrobials this admission: Cefepime 10/18>>   Microbiology results: 10/18 BCx: NGTD 10/18 BCx repeat: sent 10/18 UCx: sent    Thank you for allowing pharmacy to be a part of this patient's care.  Reuel Boom, PharmD, BCPS Pager: [phone] 11/08/2015, 4:41 PM

## 2015-11-09 ENCOUNTER — Telehealth (HOSPITAL_COMMUNITY): Payer: Self-pay

## 2015-11-09 LAB — BLOOD CULTURE ID PANEL (REFLEXED)
Acinetobacter baumannii: DETECTED — AB
CANDIDA KRUSEI: NOT DETECTED
CANDIDA TROPICALIS: NOT DETECTED
CARBAPENEM RESISTANCE: NOT DETECTED
Candida albicans: NOT DETECTED
Candida glabrata: NOT DETECTED
Candida parapsilosis: NOT DETECTED
ENTEROCOCCUS SPECIES: NOT DETECTED
Enterobacter cloacae complex: NOT DETECTED
Enterobacteriaceae species: NOT DETECTED
Escherichia coli: NOT DETECTED
Haemophilus influenzae: NOT DETECTED
KLEBSIELLA OXYTOCA: NOT DETECTED
Klebsiella pneumoniae: NOT DETECTED
LISTERIA MONOCYTOGENES: NOT DETECTED
NEISSERIA MENINGITIDIS: NOT DETECTED
PROTEUS SPECIES: NOT DETECTED
Pseudomonas aeruginosa: NOT DETECTED
SERRATIA MARCESCENS: NOT DETECTED
STAPHYLOCOCCUS AUREUS BCID: NOT DETECTED
STAPHYLOCOCCUS SPECIES: NOT DETECTED
STREPTOCOCCUS PNEUMONIAE: NOT DETECTED
Streptococcus agalactiae: NOT DETECTED
Streptococcus pyogenes: NOT DETECTED
Streptococcus species: NOT DETECTED

## 2015-11-09 LAB — URINE CULTURE: Culture: <10000 — AB

## 2015-11-09 NOTE — Telephone Encounter (Signed)
Call from [facility] in [employer] w/critical value.  Informed him pt transferred to [facility], accepted by oncology.

## 2015-11-09 NOTE — ED Provider Notes (Signed)
I assumed care of this patient from Dr. Eulis Foster at 1600.  Please see their note for further details of Hx, PE.  Briefly patient is a 82 y.o. female with with a history of breast cancer, CLL, pancytopenia, and MDS who presents to the ED with  Flank Pain Workup was consistent with pyelonephritis. Patient also neutropenic and anemic. Patient has received IV fluids, antibiotics. Plan is for the patient to be transferred to [facility] out of the oncology service. Transfusion order already in place for packed red blood cells.  During my care patient remained hemodynamically stable. Was accepted at [facility] and transferred.   Disposition: Transfer  Condition: fair    Fatima Blank, MD 11/09/15 [phone]

## 2015-11-10 LAB — URINE CULTURE

## 2015-11-11 LAB — CULTURE, BLOOD (ROUTINE X 2)

## 2015-11-12 ENCOUNTER — Telehealth (HOSPITAL_BASED_OUTPATIENT_CLINIC_OR_DEPARTMENT_OTHER): Payer: Self-pay

## 2015-11-12 LAB — TYPE AND SCREEN
ABO/RH(D): O POS
ANTIBODY SCREEN: NEGATIVE
DONOR AG TYPE: NEGATIVE
Donor AG Type: NEGATIVE
Unit division: 0
Unit division: 0

## 2015-11-12 NOTE — Telephone Encounter (Signed)
Post ED Visit - Positive Culture Follow-up  Culture report reviewed by antimicrobial stewardship pharmacist:  []  Elenor Quinones, Pharm.D. []  Heide Guile, Pharm.D., BCPS []  Parks Neptune, Pharm.D. []  Alycia Rossetti, Pharm.D., BCPS [x]  [place], [place].D., BCPS, AAHIVP []  Legrand Como, Pharm.D., BCPS, AAHIVP []  Milus Glazier, Pharm.D. []  Stephens November, [place].D.  Positive blood culture  and no further patient follow-up is required at this time d/t Pt being transferred to [facility] and they are aware of cultrue  Markella Dao, [place] Comment 11/12/2015, 11:15 AM

## 2015-11-16 DIAGNOSIS — R5381 Other malaise: Secondary | ICD-10-CM | POA: Diagnosis not present

## 2015-11-16 DIAGNOSIS — D704 Cyclic neutropenia: Secondary | ICD-10-CM | POA: Diagnosis not present

## 2015-11-16 DIAGNOSIS — E039 Hypothyroidism, unspecified: Secondary | ICD-10-CM | POA: Diagnosis not present

## 2015-11-16 DIAGNOSIS — F419 Anxiety disorder, unspecified: Secondary | ICD-10-CM | POA: Diagnosis not present

## 2015-11-16 DIAGNOSIS — R488 Other symbolic dysfunctions: Secondary | ICD-10-CM | POA: Diagnosis not present

## 2015-11-16 DIAGNOSIS — I480 Paroxysmal atrial fibrillation: Secondary | ICD-10-CM | POA: Diagnosis not present

## 2015-11-16 DIAGNOSIS — D469 Myelodysplastic syndrome, unspecified: Secondary | ICD-10-CM | POA: Diagnosis not present

## 2015-11-16 DIAGNOSIS — R2689 Other abnormalities of gait and mobility: Secondary | ICD-10-CM | POA: Diagnosis not present

## 2015-11-16 DIAGNOSIS — R41841 Cognitive communication deficit: Secondary | ICD-10-CM | POA: Diagnosis not present

## 2015-11-16 DIAGNOSIS — I4891 Unspecified atrial fibrillation: Secondary | ICD-10-CM | POA: Diagnosis not present

## 2015-11-16 DIAGNOSIS — G936 Cerebral edema: Secondary | ICD-10-CM | POA: Diagnosis not present

## 2015-11-16 DIAGNOSIS — D508 Other iron deficiency anemias: Secondary | ICD-10-CM | POA: Diagnosis not present

## 2015-11-16 DIAGNOSIS — D61818 Other pancytopenia: Secondary | ICD-10-CM | POA: Diagnosis not present

## 2015-11-16 DIAGNOSIS — F411 Generalized anxiety disorder: Secondary | ICD-10-CM | POA: Diagnosis not present

## 2015-11-16 DIAGNOSIS — D703 Neutropenia due to infection: Secondary | ICD-10-CM | POA: Diagnosis not present

## 2015-11-16 DIAGNOSIS — R5081 Fever presenting with conditions classified elsewhere: Secondary | ICD-10-CM | POA: Diagnosis not present

## 2015-11-16 DIAGNOSIS — D649 Anemia, unspecified: Secondary | ICD-10-CM | POA: Diagnosis not present

## 2015-11-16 DIAGNOSIS — A498 Other bacterial infections of unspecified site: Secondary | ICD-10-CM | POA: Diagnosis not present

## 2015-11-16 DIAGNOSIS — E038 Other specified hypothyroidism: Secondary | ICD-10-CM | POA: Diagnosis not present

## 2015-11-16 DIAGNOSIS — M6281 Muscle weakness (generalized): Secondary | ICD-10-CM | POA: Diagnosis not present

## 2015-11-16 DIAGNOSIS — D709 Neutropenia, unspecified: Secondary | ICD-10-CM | POA: Diagnosis not present

## 2015-11-16 DIAGNOSIS — D329 Benign neoplasm of meninges, unspecified: Secondary | ICD-10-CM | POA: Diagnosis not present

## 2015-11-16 DIAGNOSIS — D6189 Other specified aplastic anemias and other bone marrow failure syndromes: Secondary | ICD-10-CM | POA: Diagnosis not present

## 2015-11-16 DIAGNOSIS — R197 Diarrhea, unspecified: Secondary | ICD-10-CM | POA: Diagnosis not present

## 2015-11-16 DIAGNOSIS — E43 Unspecified severe protein-calorie malnutrition: Secondary | ICD-10-CM | POA: Diagnosis not present

## 2015-11-16 DIAGNOSIS — D708 Other neutropenia: Secondary | ICD-10-CM | POA: Diagnosis not present

## 2015-11-16 DIAGNOSIS — A419 Sepsis, unspecified organism: Secondary | ICD-10-CM | POA: Diagnosis not present

## 2015-11-16 DIAGNOSIS — R740 Nonspecific elevation of levels of transaminase and lactic acid dehydrogenase [LDH]: Secondary | ICD-10-CM | POA: Diagnosis not present

## 2015-11-16 DIAGNOSIS — R260 Ataxic gait: Secondary | ICD-10-CM | POA: Diagnosis not present

## 2015-11-16 DIAGNOSIS — R1319 Other dysphagia: Secondary | ICD-10-CM | POA: Diagnosis not present

## 2015-11-16 DIAGNOSIS — D696 Thrombocytopenia, unspecified: Secondary | ICD-10-CM | POA: Diagnosis not present

## 2015-11-16 DIAGNOSIS — R278 Other lack of coordination: Secondary | ICD-10-CM | POA: Diagnosis not present

## 2015-11-16 LAB — BASIC METABOLIC PANEL
BUN: 24 mg/dL — AB (ref 4–21)
CREATININE: 0.5 mg/dL (ref 0.5–1.1)
Glucose: 196 mg/dL
POTASSIUM: 4.1 mmol/L (ref 3.4–5.3)
Sodium: 140 mmol/L (ref 137–147)

## 2015-11-16 LAB — HEPATIC FUNCTION PANEL
ALK PHOS: 43 U/L (ref 25–125)
ALT: 22 U/L (ref 7–35)
AST: 19 U/L (ref 13–35)
Bilirubin, Total: 1 mg/dL

## 2015-11-16 LAB — CBC AND DIFFERENTIAL
HCT: 28 % — AB (ref 36–46)
HEMOGLOBIN: 9.7 g/dL — AB (ref 12.0–16.0)
Platelets: 15 10*3/uL — AB (ref 150–399)
WBC: 0.6 10^3/mL

## 2015-11-20 ENCOUNTER — Non-Acute Institutional Stay (SKILLED_NURSING_FACILITY): Payer: Medicare Other | Admitting: Internal Medicine

## 2015-11-20 ENCOUNTER — Encounter: Payer: Self-pay | Admitting: Internal Medicine

## 2015-11-20 DIAGNOSIS — D469 Myelodysplastic syndrome, unspecified: Secondary | ICD-10-CM | POA: Diagnosis not present

## 2015-11-20 DIAGNOSIS — E039 Hypothyroidism, unspecified: Secondary | ICD-10-CM | POA: Diagnosis not present

## 2015-11-20 DIAGNOSIS — D61818 Other pancytopenia: Secondary | ICD-10-CM | POA: Diagnosis not present

## 2015-11-20 DIAGNOSIS — F411 Generalized anxiety disorder: Secondary | ICD-10-CM | POA: Diagnosis not present

## 2015-11-20 DIAGNOSIS — R5381 Other malaise: Secondary | ICD-10-CM

## 2015-11-20 DIAGNOSIS — D329 Benign neoplasm of meninges, unspecified: Secondary | ICD-10-CM | POA: Diagnosis not present

## 2015-11-20 DIAGNOSIS — D703 Neutropenia due to infection: Secondary | ICD-10-CM | POA: Diagnosis not present

## 2015-11-20 DIAGNOSIS — A498 Other bacterial infections of unspecified site: Secondary | ICD-10-CM | POA: Diagnosis not present

## 2015-11-20 DIAGNOSIS — I480 Paroxysmal atrial fibrillation: Secondary | ICD-10-CM

## 2015-11-20 NOTE — Progress Notes (Signed)
LOCATION: [place]  PCP: Haywood Pao, MD   Code Status: DNR  Goals of care: Advanced Directive information Advanced Directives 11/20/2015  Does patient have an advance directive? Yes  Type of Advance Directive Out of facility DNR (pink MOST or yellow form)  Does patient want to make changes to advanced directive? No - Patient declined  Copy of advanced directive(s) in chart? Yes  Pre-existing out of facility DNR order (yellow form or pink MOST form) -       Extended Emergency Contact Information Primary Emergency Contact: Cornfield,Vernon Address: [address]          [place], [place] [address] [place] Phone: [phone] Relation: Spouse Secondary Emergency Contact: Maryan Rued [place] Phone: [phone] Relation: Daughter   Allergies  Allergen Reactions  . Influenza Vaccines     Arm swelling  . Lorazepam Other (See Comments)    Hallucinations  And cant walk  . Pneumococcal Vaccines Other (See Comments)    Arms swell  . Sulfa Antibiotics Other (See Comments)    Flushing face     Chief Complaint  Patient presents with  . New Admit To SNF    New Admission Visit     HPI:  Patient is a 82 y.o. female seen today for short term rehabilitation post hospital admission from 11/08/15-11/16/15 with neutropenic fever and acinetobacter baumanii bacteremia. She was started on iv antibiotic and later switched to po antibiotic at discharge. She had mild left sided weakness and MRI brain showed possible meningioma and some vasogenic edema without midline shift. Neurosurgery was consulted and recommended decadron for now and follow up as outpatient. She developed afib with RVR in the hospital and required iv metoprolol and diltiazem. She was not placed on anticoagulation given the possibility for neurosurgical intervention and her significant thrombocytopenia. She has PMH of myelodysplastic syndrome with  pancytopenia and is to follow with her oncologist. She is seen in her room today with her husband at bedside.   Review of Systems:  Constitutional: Negative for fever, chills and diaphoresis. Energy level is slowly coming back. HENT: Negative for headache, congestion, nasal discharge, difficulty swallowing.   Eyes: Negative for blurred vision, double vision and discharge.  Respiratory: Negative for cough, shortness of breath and wheezing.   Cardiovascular: Negative for chest pain, palpitations, leg swelling.  Gastrointestinal: Negative for heartburn, nausea, vomiting, abdominal pain. Last bowel movement was 2 days ago. Genitourinary: Negative for dysuria and flank pain.  Musculoskeletal: Negative for back pain, fall in the facility.  Skin: Negative for itching, rash.  Neurological: Negative for dizziness. Psychiatric/Behavioral: Negative for depression   Past Medical History:  Diagnosis Date  . Anxiety   . Breast cancer ([place])    left   . CLL (chronic lymphocytic leukemia) ([place])   . Heart murmur   . Pancytopenia [facility])    Past Surgical History:  Procedure Laterality Date  . bone marrow needle core biopsy    . BREAST LUMPECTOMY  2010   left  breast  . BREAST SURGERY    . VAGINAL HYSTERECTOMY     Social History:   reports that she has never smoked. She has never used smokeless tobacco. She reports that she does not drink alcohol or use drugs.  No family history on file.  Medications:   Medication List       Accurate as of 11/20/15  2:04 PM. Always use your most recent med list.  calcium carbonate 1500 (600 Ca) MG Tabs tablet Commonly known as:  OSCAL Take 1 tablet by mouth daily.   ciprofloxacin 500 MG tablet Commonly known as:  CIPRO Take 500 mg by mouth 2 (two) times daily. Stop date 11/23/15   dexamethasone 4 MG tablet Commonly known as:  DECADRON Take 4 mg by mouth daily. Stop date 11/26/15   dexamethasone 2 MG tablet Commonly known as:   DECADRON Take 2 mg by mouth daily. Stop date 11/29/15   diltiazem 180 MG 24 hr capsule Commonly known as:  CARDIZEM CD Take 180 mg by mouth daily.   fluconazole 200 MG tablet Commonly known as:  DIFLUCAN Take 200 mg by mouth daily.   LACTOBACILLUS RHAMNOSUS (GG) PO Take 1 tablet by mouth daily.   levofloxacin 500 MG tablet Commonly known as:  LEVAQUIN Take 500 mg by mouth daily.   levothyroxine 100 MCG tablet Commonly known as:  SYNTHROID, LEVOTHROID Take 100 mcg by mouth daily before breakfast.   psyllium 58.6 % packet Commonly known as:  METAMUCIL Take 1 packet by mouth daily.   ranitidine 150 MG tablet Commonly known as:  ZANTAC Take 150 mg by mouth 2 (two) times daily. Stop date 11/30/15       Immunizations: Immunization History  Administered Date(s) Administered  . Influenza-Unspecified 10/11/2011     Physical Exam:  Vitals:   11/20/15 1112  BP: 110/72  Pulse: 72  Resp: 20  Temp: 98.2 F (36.8 C)  TempSrc: Oral  SpO2: 97%  Weight: 128 lb (58.1 kg)  Height: 5\' 5"  (1.651 m)   Body mass index is 21.3 kg/m.  General- elderly female, thin built, in no acute distress Head- normocephalic, atraumatic Nose- no nasal discharge Throat- moist mucus membrane Eyes- PERRLA, EOMI, no pallor, no icterus, no discharge, normal conjunctiva, normal sclera Neck- no cervical lymphadenopathy Cardiovascular- normal s1,s2, no murmur, no leg edema Respiratory- bilateral clear to auscultation, no wheeze, no rhonchi, no crackles, no use of accessory muscles Abdomen- bowel sounds present, soft, non tender, no guarding or rigidity, no CVA tenderness Musculoskeletal- able to move all 4 extremities, generalized weakness Neurological- alert and oriented to person, place and time Skin- warm and dry, ecchymoses present Psychiatry- somewhat anxious during conversation    Labs reviewed: Basic Metabolic Panel:  Recent Labs  04/19/15 0030 04/20/15 0600 11/08/15 1140  11/16/15  NA 139 143 135 140  K 3.7 3.9 3.8 4.1  CL 103 107 97*  --   CO2 27 30 28   --   GLUCOSE 143* 132* 175*  --   BUN 14 13 25* 24*  CREATININE 0.56 0.55 0.55 0.5  CALCIUM 9.1 8.9 8.9  --    Liver Function Tests:  Recent Labs  11/08/15 1140 11/16/15  AST 39 19  ALT 38 22  ALKPHOS 50 43  BILITOT 4.0*  --   PROT 6.3*  --   ALBUMIN 4.0  --    No results for input(s): LIPASE, AMYLASE in the last 8760 hours. No results for input(s): AMMONIA in the last 8760 hours. CBC:  Recent Labs  04/19/15 0030 04/20/15 0600 11/08/15 1140 11/16/15  WBC 3.6* 2.4* 0.7* 0.6  NEUTROABS 2.6  --  0.4*  --   HGB 7.9* 7.4* 6.8* 9.7*  HCT 23.8* 22.2* 20.3* 28*  MCV 104.8* 105.7* 109.7*  --   PLT 53* 54* 12* 15*     Radiological Exams: Dg Chest Port 1 View  Result Date: 11/08/2015 CLINICAL DATA:  Flank pain.  Generalized weakness. EXAM: PORTABLE CHEST 1 VIEW COMPARISON:  08/19/2011 FINDINGS: There is a right chest wall port a catheter with tip in the right atrium. Mild cardiac enlargement. Aortic atherosclerosis identified. There is mild diffuse pulmonary edema. Atelectasis is identified within the left base. IMPRESSION: 1. Mild pulmonary edema. 2. Left base atelectasis. Electronically Signed   By: Kerby Moors M.D.   On: 11/08/2015 11:30   Ct Head Wo Contrast Impression. No CT evidence of acute hemorrhage. Large extra-axial, dural based, heterogenous partially calcified mass along the high right convexity with mass effect and vasogenic edema as described above. Findings most likely represents a meningioma, but other dural based neoplasm could be considered. MRI with contrast may be helpful for further evaluation.  [place] Urinary System/Renal Impression. No hydronephrosis. Simple right renal cyst.  Xr Chest Pa and Lateral Impression. Increased opacification in the left chest base likely relates to pleural fluid and overlying consolidation, which may relate to infectious pneumonia and/or  aspiration.   Assessment/Plan  Physical deconditioning With generalized weakness. Will have her work with physical therapy and occupational therapy team to help with gait training and muscle strengthening exercises.fall precautions. Skin care. Encourage to be out of bed.   Neutropenia With neutropenic fever requiring hospitalization. Has bacteremia and is currently on antibiotic treatment. Currently afebrile. Monitor wbc and temp curve. Under neutropenic precautions. Continue diflucan for prophylaxis and resume levaquin for prophylaxis after completion of ciprofloxacin course.  acinetobacter bacteremia Afebrile. Continue and complete course of ciprofloxacin 500 mg q12h on 11/23/15 and then start levaquin 500 mg daily from 11/24/15 for prophylaxis. Monitor wbc and temp curve  Meningioma With vasogenic edema, pending neurosurgery follow up. Continue slow tapering course of dexamethasone until 11/29/15.  Pancytopenia Monitor for signs of bleed and infection. Monitor cbc. To follow with oncology to evaluate further  MDS Has oncology follow up.   GI prophylaxis To prevent stress ulcer. Stable, continue ranitidine for now while on steroid  Hypothyroidism Continue levothyroxine for now  afib Rate controlled. Continue diltiazem 180 mg daily for rate control. Not on anticoagulation for now with her thrombocytopenia and possibility of neurosurgery intervention.   Anxiety With her current medical condition and being in a SNF, likely situational. Get psychiatry consult to assess further. Provide supportive care.    Goals of care: short term rehabilitation   Labs/tests ordered:  Family/ staff Communication: reviewed care plan with patient and nursing supervisor    Blanchie Serve, MD Internal Medicine [place], [place] [address] Cell Phone (Monday-Friday 8 am - 5 pm): [phone] On Call: [phone] and follow prompts  after 5 pm and on weekends Office Phone: [phone] Office Fax: [phone]

## 2015-11-22 LAB — CBC AND DIFFERENTIAL
HCT: 30 % — AB (ref 36–46)
HEMOGLOBIN: 10 g/dL — AB (ref 12.0–16.0)
Platelets: 20 10*3/uL — AB (ref 150–399)
WBC: 1.2 10^3/mL

## 2015-11-24 DIAGNOSIS — I4891 Unspecified atrial fibrillation: Secondary | ICD-10-CM | POA: Diagnosis not present

## 2015-11-24 DIAGNOSIS — D329 Benign neoplasm of meninges, unspecified: Secondary | ICD-10-CM | POA: Diagnosis not present

## 2015-11-24 DIAGNOSIS — D469 Myelodysplastic syndrome, unspecified: Secondary | ICD-10-CM | POA: Diagnosis not present

## 2015-11-24 DIAGNOSIS — E039 Hypothyroidism, unspecified: Secondary | ICD-10-CM | POA: Diagnosis not present

## 2015-12-01 DIAGNOSIS — R740 Nonspecific elevation of levels of transaminase and lactic acid dehydrogenase [LDH]: Secondary | ICD-10-CM | POA: Diagnosis not present

## 2015-12-01 DIAGNOSIS — R197 Diarrhea, unspecified: Secondary | ICD-10-CM | POA: Diagnosis not present

## 2015-12-01 DIAGNOSIS — D469 Myelodysplastic syndrome, unspecified: Secondary | ICD-10-CM | POA: Diagnosis not present

## 2015-12-05 LAB — CBC AND DIFFERENTIAL
HCT: 20 % — AB (ref 36–46)
HEMOGLOBIN: 6.9 g/dL — AB (ref 12.0–16.0)
PLATELETS: 10 10*3/uL — AB (ref 150–399)
WBC: 1.4 10^3/mL

## 2015-12-05 LAB — BASIC METABOLIC PANEL
BUN: 16 mg/dL (ref 4–21)
Creatinine: 0.4 mg/dL — AB (ref 0.5–1.1)
GLUCOSE: 127 mg/dL
POTASSIUM: 3.9 mmol/L (ref 3.4–5.3)
Sodium: 139 mmol/L (ref 137–147)

## 2015-12-06 ENCOUNTER — Encounter: Payer: Self-pay | Admitting: Internal Medicine

## 2015-12-06 ENCOUNTER — Non-Acute Institutional Stay (SKILLED_NURSING_FACILITY): Payer: Medicare Other | Admitting: Internal Medicine

## 2015-12-06 DIAGNOSIS — D696 Thrombocytopenia, unspecified: Secondary | ICD-10-CM | POA: Diagnosis not present

## 2015-12-06 DIAGNOSIS — R1319 Other dysphagia: Secondary | ICD-10-CM | POA: Diagnosis not present

## 2015-12-06 DIAGNOSIS — D708 Other neutropenia: Secondary | ICD-10-CM

## 2015-12-06 DIAGNOSIS — D469 Myelodysplastic syndrome, unspecified: Secondary | ICD-10-CM

## 2015-12-06 DIAGNOSIS — I480 Paroxysmal atrial fibrillation: Secondary | ICD-10-CM

## 2015-12-06 DIAGNOSIS — D6189 Other specified aplastic anemias and other bone marrow failure syndromes: Secondary | ICD-10-CM | POA: Diagnosis not present

## 2015-12-06 DIAGNOSIS — R5381 Other malaise: Secondary | ICD-10-CM

## 2015-12-06 DIAGNOSIS — E038 Other specified hypothyroidism: Secondary | ICD-10-CM

## 2015-12-06 NOTE — Progress Notes (Signed)
LOCATION: [place]  PCP: Haywood Pao, MD   Code Status: DNR  Goals of care: Advanced Directive information Advanced Directives 11/20/2015  Does patient have an advance directive? Yes  Type of Advance Directive Out of facility DNR (pink MOST or yellow form)  Does patient want to make changes to advanced directive? No - Patient declined  Copy of advanced directive(s) in chart? Yes  Pre-existing out of facility DNR order (yellow form or pink MOST form) -       Extended Emergency Contact Information Primary Emergency Contact: Finnell,Vernon Address: [address]          [place], [place] [address] [place] Phone: [phone] Relation: Spouse Secondary Emergency Contact: Maryan Rued [place] Phone: [phone] Relation: Daughter   Allergies  Allergen Reactions  . Influenza Vaccines     Arm swelling  . Lorazepam Other (See Comments)    Hallucinations  And cant walk  . Pneumococcal Vaccines Other (See Comments)    Arms swell  . Sulfa Antibiotics Other (See Comments)    Flushing face     Chief Complaint  Patient presents with  . Discharge Note    Discharge Visit     HPI:  Patient is a 82 y.o. female seen today for discharge visit. She has made some improvement in her strength and gait with therapy team. She has MDS and was here for short term rehabilitation post hospital admission from 11/08/15-11/16/15 with neutropenic fever and acinetobacter baumanii bacteremia. She has completed her antibiotic and remains afebrile. Her blood work from yesterday shows drop in hemoglobin to critical value and platelet count along with wbc. Her oncology center has been contacted and would like her to be discharged from the facility and go to the cancer center for blood transfusion and then return home. This has been discussed with her husband who refuses to take her there. He took her home against medical advise. her  daughter has bee contacted and she is now at the facility and patient's script has been provided to her and she is to talk with patient and her husband and take patient to [facility] for blood transfusion.    Review of Systems:  Constitutional: Negative for fever, chills and diaphoresis. Energy level is poor. HENT: Negative for headache, nasal discharge, difficulty swallowing. Positive for nasal congestion and nose bleed last night.   Eyes: Negative for double vision and discharge.  Respiratory: Negative for cough, shortness of breath and wheezing.   Cardiovascular: Negative for chest pain, palpitations, leg swelling.  Gastrointestinal: Negative for heartburn, nausea, vomiting, abdominal pain. Had bowel movement this am. Denies melena or rectal bleed.  Genitourinary: Negative for dysuria, hematuri.  Musculoskeletal: Negative for fall in the facility.  Skin: Negative for itching, rash.  Neurological: Negative for dizziness. Psychiatric/Behavioral: Negative for depression   Past Medical History:  Diagnosis Date  . Anxiety   . Breast cancer ([place])    left   . CLL (chronic lymphocytic leukemia) ([place])   . Heart murmur   . Pancytopenia [facility])    Past Surgical History:  Procedure Laterality Date  . bone marrow needle core biopsy    . BREAST LUMPECTOMY  2010   left  breast  . BREAST SURGERY    . VAGINAL HYSTERECTOMY     Social History:   reports that she has never smoked. She has never used smokeless tobacco. She reports that she does not drink alcohol or use  drugs.  No family history on file.  Medications:   Medication List       Accurate as of 12/06/15  2:05 PM. Always use your most recent med list.          calcium carbonate 1500 (600 Ca) MG Tabs tablet Commonly known as:  OSCAL Take 1 tablet by mouth daily.   diltiazem 180 MG 24 hr capsule Commonly known as:  CARDIZEM CD Take 180 mg by mouth daily.   fluconazole 200 MG tablet Commonly known as:  DIFLUCAN Take 200  mg by mouth daily.   LACTOBACILLUS RHAMNOSUS (GG) PO Take 1 tablet by mouth daily.   levofloxacin 500 MG tablet Commonly known as:  LEVAQUIN Take 500 mg by mouth daily.   levothyroxine 100 MCG tablet Commonly known as:  SYNTHROID, LEVOTHROID Take 100 mcg by mouth daily before breakfast.   psyllium 58.6 % packet Commonly known as:  METAMUCIL Take 1 packet by mouth daily.   UNABLE TO FIND Med Name: Med pass 90 cc by mouth 2 times daily       Immunizations: Immunization History  Administered Date(s) Administered  . Influenza-Unspecified 10/11/2011     Physical Exam:  Vitals:   12/06/15 0925  BP: 112/71  Pulse: 65  Resp: 18  Temp: 97 F (36.1 C)  TempSrc: Oral  SpO2: 95%  Weight: 128 lb (58.1 kg)  Height: 5\' 5"  (1.651 m)   Body mass index is 21.3 kg/m.  General- elderly female, thin built, frail, in no acute distress Head- normocephalic, atraumatic Nose- no nasal discharge, has dried blood in both nares Throat- moist mucus membrane Eyes- PERRLA, EOMI, + pallor, no icterus, no discharge, normal conjunctiva, normal sclera Neck- no cervical lymphadenopathy Cardiovascular- normal s1,s2, no murmur, no leg edema Respiratory- bilateral clear to auscultation, no wheeze, no rhonchi, no crackles, no use of accessory muscles Abdomen- bowel sounds present, soft, non tender, no guarding or rigidity, no CVA tenderness Musculoskeletal- able to move all 4 extremities, generalized weakness Neurological- alert and oriented to person, place and time Skin- warm and dry, ecchymoses present Psychiatry- normal mood and affect    Labs reviewed: Basic Metabolic Panel:  Recent Labs  04/19/15 0030 04/20/15 0600 11/08/15 1140 11/16/15 12/05/15  NA 139 143 135 140 139  K 3.7 3.9 3.8 4.1 3.9  CL 103 107 97*  --   --   CO2 27 30 28   --   --   GLUCOSE 143* 132* 175*  --   --   BUN 14 13 25* 24* 16  CREATININE 0.56 0.55 0.55 0.5 0.4*  CALCIUM 9.1 8.9 8.9  --   --    Liver  Function Tests:  Recent Labs  11/08/15 1140 11/16/15  AST 39 19  ALT 38 22  ALKPHOS 50 43  BILITOT 4.0*  --   PROT 6.3*  --   ALBUMIN 4.0  --    No results for input(s): LIPASE, AMYLASE in the last 8760 hours. No results for input(s): AMMONIA in the last 8760 hours. CBC:  Recent Labs  04/19/15 0030 04/20/15 0600 11/08/15 1140 11/16/15 11/22/15 12/05/15  WBC 3.6* 2.4* 0.7* 0.6 1.2 1.4  NEUTROABS 2.6  --  0.4*  --   --   --   HGB 7.9* 7.4* 6.8* 9.7* 10.0* 6.9*  HCT 23.8* 22.2* 20.3* 28* 30* 20*  MCV 104.8* 105.7* 109.7*  --   --   --   PLT 53* 54* 12* 15* 20* 10*  Radiological Exams: Dg Chest Port 1 View  Result Date: 11/08/2015 CLINICAL DATA:  Flank pain.  Generalized weakness. EXAM: PORTABLE CHEST 1 VIEW COMPARISON:  08/19/2011 FINDINGS: There is a right chest wall port a catheter with tip in the right atrium. Mild cardiac enlargement. Aortic atherosclerosis identified. There is mild diffuse pulmonary edema. Atelectasis is identified within the left base. IMPRESSION: 1. Mild pulmonary edema. 2. Left base atelectasis. Electronically Signed   By: Kerby Moors M.D.   On: 11/08/2015 11:30   Ct Head Wo Contrast Impression. No CT evidence of acute hemorrhage. Large extra-axial, dural based, heterogenous partially calcified mass along the high right convexity with mass effect and vasogenic edema as described above. Findings most likely represents a meningioma, but other dural based neoplasm could be considered. MRI with contrast may be helpful for further evaluation.  [place] Urinary System/Renal Impression. No hydronephrosis. Simple right renal cyst.  Xr Chest Pa and Lateral Impression. Increased opacification in the left chest base likely relates to pleural fluid and overlying consolidation, which may relate to infectious pneumonia and/or aspiration.     Assessment/Plan  Physical deconditioning With generalized weakness. Will have her work with physical therapy and  occupational therapy team to help with gait training and muscle strengthening exercises.fall precautions. Skin care. Encourage to be out of bed.   Dysphagia Will have SLP to work with her at home with aspiration precautions, cognitive deficit  Neutropenia With neutropenic fever requiring hospitalization. Currently afebrile. continue prophylactic levaquin and fluconazole dosing. Monitor for fever. RN services to help with med management  Anemia Has history of MDS. With critical hb level, she will need to go to cancer center for transfusion. Denies symptom but has pallor on exam. Advised daughter to take pt for blood transfusion and information to help schedule appointment has been provided.  Thrombocytopenia Had nose bleed last night. No active bleed at present. Refer to cancer center for transfusion today. F/u with oncology  MDS Has oncology follow up.   Hypothyroidism Continue levothyroxine for now  afib Rate controlled. Continue diltiazem 180 mg daily for rate control. Not on anticoagulation for now with her thrombocytopenia, anemia and possibility of neurosurgery intervention.    Patient is being discharged with the following home health services:  PT, OT, RN, SLP, HHA, social worker  Patient is being discharged with the following durable medical equipment:  Front wheel walker to help with mobility and her ADLs  Patient has been advised to f/u with their PCP in 1-2 weeks to bring them up to date on their rehab stay.  Social services at facility was responsible for arranging this appointment.  Patient was provided with a 30 day supply of prescriptions for medications and refills must be obtained from their PCP.  For controlled substances, a more limited supply may be provided adequate until PCP appointment only.   Blanchie Serve, MD Internal Medicine [facility] [address] [place], [place] [address] Cell Phone (Monday-Friday 8 am - 5 pm):  [phone] On Call: [phone] and follow prompts after 5 pm and on weekends Office Phone: [phone] Office Fax: [phone]

## 2015-12-07 DIAGNOSIS — D709 Neutropenia, unspecified: Secondary | ICD-10-CM | POA: Diagnosis not present

## 2015-12-07 DIAGNOSIS — D696 Thrombocytopenia, unspecified: Secondary | ICD-10-CM | POA: Diagnosis not present

## 2015-12-07 DIAGNOSIS — D469 Myelodysplastic syndrome, unspecified: Secondary | ICD-10-CM | POA: Diagnosis not present

## 2015-12-07 DIAGNOSIS — F0390 Unspecified dementia without behavioral disturbance: Secondary | ICD-10-CM | POA: Diagnosis not present

## 2015-12-07 DIAGNOSIS — G936 Cerebral edema: Secondary | ICD-10-CM | POA: Diagnosis not present

## 2015-12-07 DIAGNOSIS — C9110 Chronic lymphocytic leukemia of B-cell type not having achieved remission: Secondary | ICD-10-CM | POA: Diagnosis not present

## 2015-12-07 DIAGNOSIS — D708 Other neutropenia: Secondary | ICD-10-CM | POA: Diagnosis not present

## 2015-12-11 DIAGNOSIS — D709 Neutropenia, unspecified: Secondary | ICD-10-CM | POA: Diagnosis not present

## 2015-12-11 DIAGNOSIS — F0390 Unspecified dementia without behavioral disturbance: Secondary | ICD-10-CM | POA: Diagnosis not present

## 2015-12-11 DIAGNOSIS — D469 Myelodysplastic syndrome, unspecified: Secondary | ICD-10-CM | POA: Diagnosis not present

## 2015-12-11 DIAGNOSIS — D696 Thrombocytopenia, unspecified: Secondary | ICD-10-CM | POA: Diagnosis not present

## 2015-12-11 DIAGNOSIS — C9110 Chronic lymphocytic leukemia of B-cell type not having achieved remission: Secondary | ICD-10-CM | POA: Diagnosis not present

## 2015-12-11 DIAGNOSIS — G936 Cerebral edema: Secondary | ICD-10-CM | POA: Diagnosis not present

## 2015-12-12 DIAGNOSIS — Z79899 Other long term (current) drug therapy: Secondary | ICD-10-CM | POA: Diagnosis not present

## 2015-12-12 DIAGNOSIS — Z85828 Personal history of other malignant neoplasm of skin: Secondary | ICD-10-CM | POA: Diagnosis not present

## 2015-12-12 DIAGNOSIS — E039 Hypothyroidism, unspecified: Secondary | ICD-10-CM | POA: Diagnosis not present

## 2015-12-12 DIAGNOSIS — R350 Frequency of micturition: Secondary | ICD-10-CM | POA: Diagnosis not present

## 2015-12-12 DIAGNOSIS — Z87440 Personal history of urinary (tract) infections: Secondary | ICD-10-CM | POA: Diagnosis not present

## 2015-12-12 DIAGNOSIS — Z9889 Other specified postprocedural states: Secondary | ICD-10-CM | POA: Diagnosis not present

## 2015-12-12 DIAGNOSIS — R197 Diarrhea, unspecified: Secondary | ICD-10-CM | POA: Diagnosis not present

## 2015-12-12 DIAGNOSIS — D469 Myelodysplastic syndrome, unspecified: Secondary | ICD-10-CM | POA: Diagnosis not present

## 2015-12-12 DIAGNOSIS — R740 Nonspecific elevation of levels of transaminase and lactic acid dehydrogenase [LDH]: Secondary | ICD-10-CM | POA: Diagnosis not present

## 2015-12-12 DIAGNOSIS — R2232 Localized swelling, mass and lump, left upper limb: Secondary | ICD-10-CM | POA: Diagnosis not present

## 2015-12-12 DIAGNOSIS — T50905A Adverse effect of unspecified drugs, medicaments and biological substances, initial encounter: Secondary | ICD-10-CM | POA: Diagnosis not present

## 2015-12-12 DIAGNOSIS — R300 Dysuria: Secondary | ICD-10-CM | POA: Diagnosis not present

## 2015-12-12 DIAGNOSIS — D329 Benign neoplasm of meninges, unspecified: Secondary | ICD-10-CM | POA: Diagnosis not present

## 2015-12-12 DIAGNOSIS — Z856 Personal history of leukemia: Secondary | ICD-10-CM | POA: Diagnosis not present

## 2015-12-12 DIAGNOSIS — Z853 Personal history of malignant neoplasm of breast: Secondary | ICD-10-CM | POA: Diagnosis not present

## 2015-12-12 DIAGNOSIS — I4891 Unspecified atrial fibrillation: Secondary | ICD-10-CM | POA: Diagnosis not present

## 2015-12-13 DIAGNOSIS — D469 Myelodysplastic syndrome, unspecified: Secondary | ICD-10-CM | POA: Diagnosis not present

## 2015-12-13 DIAGNOSIS — D709 Neutropenia, unspecified: Secondary | ICD-10-CM | POA: Diagnosis not present

## 2015-12-13 DIAGNOSIS — F0390 Unspecified dementia without behavioral disturbance: Secondary | ICD-10-CM | POA: Diagnosis not present

## 2015-12-13 DIAGNOSIS — G936 Cerebral edema: Secondary | ICD-10-CM | POA: Diagnosis not present

## 2015-12-13 DIAGNOSIS — D696 Thrombocytopenia, unspecified: Secondary | ICD-10-CM | POA: Diagnosis not present

## 2015-12-13 DIAGNOSIS — C9110 Chronic lymphocytic leukemia of B-cell type not having achieved remission: Secondary | ICD-10-CM | POA: Diagnosis not present

## 2015-12-15 DIAGNOSIS — D696 Thrombocytopenia, unspecified: Secondary | ICD-10-CM | POA: Diagnosis not present

## 2015-12-15 DIAGNOSIS — D709 Neutropenia, unspecified: Secondary | ICD-10-CM | POA: Diagnosis not present

## 2015-12-15 DIAGNOSIS — C9110 Chronic lymphocytic leukemia of B-cell type not having achieved remission: Secondary | ICD-10-CM | POA: Diagnosis not present

## 2015-12-15 DIAGNOSIS — F0390 Unspecified dementia without behavioral disturbance: Secondary | ICD-10-CM | POA: Diagnosis not present

## 2015-12-15 DIAGNOSIS — G936 Cerebral edema: Secondary | ICD-10-CM | POA: Diagnosis not present

## 2015-12-15 DIAGNOSIS — D469 Myelodysplastic syndrome, unspecified: Secondary | ICD-10-CM | POA: Diagnosis not present

## 2015-12-18 DIAGNOSIS — G936 Cerebral edema: Secondary | ICD-10-CM | POA: Diagnosis not present

## 2015-12-18 DIAGNOSIS — F0390 Unspecified dementia without behavioral disturbance: Secondary | ICD-10-CM | POA: Diagnosis not present

## 2015-12-18 DIAGNOSIS — D469 Myelodysplastic syndrome, unspecified: Secondary | ICD-10-CM | POA: Diagnosis not present

## 2015-12-18 DIAGNOSIS — D696 Thrombocytopenia, unspecified: Secondary | ICD-10-CM | POA: Diagnosis not present

## 2015-12-18 DIAGNOSIS — D709 Neutropenia, unspecified: Secondary | ICD-10-CM | POA: Diagnosis not present

## 2015-12-18 DIAGNOSIS — C9110 Chronic lymphocytic leukemia of B-cell type not having achieved remission: Secondary | ICD-10-CM | POA: Diagnosis not present

## 2015-12-19 DIAGNOSIS — D469 Myelodysplastic syndrome, unspecified: Secondary | ICD-10-CM | POA: Diagnosis not present

## 2015-12-20 DIAGNOSIS — G8194 Hemiplegia, unspecified affecting left nondominant side: Secondary | ICD-10-CM | POA: Diagnosis not present

## 2015-12-20 DIAGNOSIS — D469 Myelodysplastic syndrome, unspecified: Secondary | ICD-10-CM | POA: Diagnosis not present

## 2015-12-20 DIAGNOSIS — D320 Benign neoplasm of cerebral meninges: Secondary | ICD-10-CM | POA: Diagnosis not present

## 2015-12-20 DIAGNOSIS — D696 Thrombocytopenia, unspecified: Secondary | ICD-10-CM | POA: Diagnosis not present

## 2015-12-20 DIAGNOSIS — F419 Anxiety disorder, unspecified: Secondary | ICD-10-CM | POA: Diagnosis not present

## 2015-12-21 DIAGNOSIS — C9110 Chronic lymphocytic leukemia of B-cell type not having achieved remission: Secondary | ICD-10-CM | POA: Diagnosis not present

## 2015-12-21 DIAGNOSIS — G936 Cerebral edema: Secondary | ICD-10-CM | POA: Diagnosis not present

## 2015-12-21 DIAGNOSIS — F0390 Unspecified dementia without behavioral disturbance: Secondary | ICD-10-CM | POA: Diagnosis not present

## 2015-12-21 DIAGNOSIS — D469 Myelodysplastic syndrome, unspecified: Secondary | ICD-10-CM | POA: Diagnosis not present

## 2015-12-21 DIAGNOSIS — D709 Neutropenia, unspecified: Secondary | ICD-10-CM | POA: Diagnosis not present

## 2015-12-21 DIAGNOSIS — D696 Thrombocytopenia, unspecified: Secondary | ICD-10-CM | POA: Diagnosis not present

## 2015-12-22 DIAGNOSIS — D709 Neutropenia, unspecified: Secondary | ICD-10-CM | POA: Diagnosis not present

## 2015-12-22 DIAGNOSIS — C9110 Chronic lymphocytic leukemia of B-cell type not having achieved remission: Secondary | ICD-10-CM | POA: Diagnosis not present

## 2015-12-22 DIAGNOSIS — D469 Myelodysplastic syndrome, unspecified: Secondary | ICD-10-CM | POA: Diagnosis not present

## 2015-12-22 DIAGNOSIS — G936 Cerebral edema: Secondary | ICD-10-CM | POA: Diagnosis not present

## 2015-12-22 DIAGNOSIS — D696 Thrombocytopenia, unspecified: Secondary | ICD-10-CM | POA: Diagnosis not present

## 2015-12-22 DIAGNOSIS — F0390 Unspecified dementia without behavioral disturbance: Secondary | ICD-10-CM | POA: Diagnosis not present

## 2015-12-25 DIAGNOSIS — G936 Cerebral edema: Secondary | ICD-10-CM | POA: Diagnosis not present

## 2015-12-25 DIAGNOSIS — C9110 Chronic lymphocytic leukemia of B-cell type not having achieved remission: Secondary | ICD-10-CM | POA: Diagnosis not present

## 2015-12-25 DIAGNOSIS — D696 Thrombocytopenia, unspecified: Secondary | ICD-10-CM | POA: Diagnosis not present

## 2015-12-25 DIAGNOSIS — D469 Myelodysplastic syndrome, unspecified: Secondary | ICD-10-CM | POA: Diagnosis not present

## 2015-12-25 DIAGNOSIS — F0390 Unspecified dementia without behavioral disturbance: Secondary | ICD-10-CM | POA: Diagnosis not present

## 2015-12-25 DIAGNOSIS — D709 Neutropenia, unspecified: Secondary | ICD-10-CM | POA: Diagnosis not present

## 2015-12-26 DIAGNOSIS — E039 Hypothyroidism, unspecified: Secondary | ICD-10-CM | POA: Diagnosis not present

## 2015-12-26 DIAGNOSIS — D469 Myelodysplastic syndrome, unspecified: Secondary | ICD-10-CM | POA: Diagnosis not present

## 2015-12-26 DIAGNOSIS — Z853 Personal history of malignant neoplasm of breast: Secondary | ICD-10-CM | POA: Diagnosis not present

## 2015-12-26 DIAGNOSIS — D329 Benign neoplasm of meninges, unspecified: Secondary | ICD-10-CM | POA: Diagnosis not present

## 2015-12-27 DIAGNOSIS — D709 Neutropenia, unspecified: Secondary | ICD-10-CM | POA: Diagnosis not present

## 2015-12-27 DIAGNOSIS — D469 Myelodysplastic syndrome, unspecified: Secondary | ICD-10-CM | POA: Diagnosis not present

## 2015-12-27 DIAGNOSIS — G936 Cerebral edema: Secondary | ICD-10-CM | POA: Diagnosis not present

## 2015-12-27 DIAGNOSIS — F0390 Unspecified dementia without behavioral disturbance: Secondary | ICD-10-CM | POA: Diagnosis not present

## 2015-12-27 DIAGNOSIS — D696 Thrombocytopenia, unspecified: Secondary | ICD-10-CM | POA: Diagnosis not present

## 2015-12-27 DIAGNOSIS — I480 Paroxysmal atrial fibrillation: Secondary | ICD-10-CM | POA: Diagnosis not present

## 2015-12-27 DIAGNOSIS — C9110 Chronic lymphocytic leukemia of B-cell type not having achieved remission: Secondary | ICD-10-CM | POA: Diagnosis not present

## 2015-12-27 DIAGNOSIS — R5081 Fever presenting with conditions classified elsewhere: Secondary | ICD-10-CM | POA: Diagnosis not present

## 2015-12-28 DIAGNOSIS — D469 Myelodysplastic syndrome, unspecified: Secondary | ICD-10-CM | POA: Diagnosis not present

## 2015-12-28 DIAGNOSIS — D709 Neutropenia, unspecified: Secondary | ICD-10-CM | POA: Diagnosis not present

## 2015-12-28 DIAGNOSIS — G936 Cerebral edema: Secondary | ICD-10-CM | POA: Diagnosis not present

## 2015-12-28 DIAGNOSIS — D696 Thrombocytopenia, unspecified: Secondary | ICD-10-CM | POA: Diagnosis not present

## 2015-12-28 DIAGNOSIS — C9110 Chronic lymphocytic leukemia of B-cell type not having achieved remission: Secondary | ICD-10-CM | POA: Diagnosis not present

## 2015-12-28 DIAGNOSIS — F0390 Unspecified dementia without behavioral disturbance: Secondary | ICD-10-CM | POA: Diagnosis not present

## 2015-12-29 DIAGNOSIS — G936 Cerebral edema: Secondary | ICD-10-CM | POA: Diagnosis not present

## 2015-12-29 DIAGNOSIS — D696 Thrombocytopenia, unspecified: Secondary | ICD-10-CM | POA: Diagnosis not present

## 2015-12-29 DIAGNOSIS — D709 Neutropenia, unspecified: Secondary | ICD-10-CM | POA: Diagnosis not present

## 2015-12-29 DIAGNOSIS — D469 Myelodysplastic syndrome, unspecified: Secondary | ICD-10-CM | POA: Diagnosis not present

## 2015-12-29 DIAGNOSIS — C9110 Chronic lymphocytic leukemia of B-cell type not having achieved remission: Secondary | ICD-10-CM | POA: Diagnosis not present

## 2015-12-29 DIAGNOSIS — F0390 Unspecified dementia without behavioral disturbance: Secondary | ICD-10-CM | POA: Diagnosis not present

## 2016-01-01 DIAGNOSIS — D469 Myelodysplastic syndrome, unspecified: Secondary | ICD-10-CM | POA: Diagnosis not present

## 2016-01-01 DIAGNOSIS — F0390 Unspecified dementia without behavioral disturbance: Secondary | ICD-10-CM | POA: Diagnosis not present

## 2016-01-01 DIAGNOSIS — D696 Thrombocytopenia, unspecified: Secondary | ICD-10-CM | POA: Diagnosis not present

## 2016-01-01 DIAGNOSIS — D709 Neutropenia, unspecified: Secondary | ICD-10-CM | POA: Diagnosis not present

## 2016-01-01 DIAGNOSIS — G936 Cerebral edema: Secondary | ICD-10-CM | POA: Diagnosis not present

## 2016-01-01 DIAGNOSIS — C9110 Chronic lymphocytic leukemia of B-cell type not having achieved remission: Secondary | ICD-10-CM | POA: Diagnosis not present

## 2016-01-02 DIAGNOSIS — D329 Benign neoplasm of meninges, unspecified: Secondary | ICD-10-CM | POA: Diagnosis not present

## 2016-01-02 DIAGNOSIS — R197 Diarrhea, unspecified: Secondary | ICD-10-CM | POA: Diagnosis not present

## 2016-01-02 DIAGNOSIS — R32 Unspecified urinary incontinence: Secondary | ICD-10-CM | POA: Diagnosis not present

## 2016-01-02 DIAGNOSIS — E039 Hypothyroidism, unspecified: Secondary | ICD-10-CM | POA: Diagnosis not present

## 2016-01-02 DIAGNOSIS — D696 Thrombocytopenia, unspecified: Secondary | ICD-10-CM | POA: Diagnosis not present

## 2016-01-02 DIAGNOSIS — Z87440 Personal history of urinary (tract) infections: Secondary | ICD-10-CM | POA: Diagnosis not present

## 2016-01-02 DIAGNOSIS — Z856 Personal history of leukemia: Secondary | ICD-10-CM | POA: Diagnosis not present

## 2016-01-02 DIAGNOSIS — L989 Disorder of the skin and subcutaneous tissue, unspecified: Secondary | ICD-10-CM | POA: Diagnosis not present

## 2016-01-02 DIAGNOSIS — R3915 Urgency of urination: Secondary | ICD-10-CM | POA: Diagnosis not present

## 2016-01-02 DIAGNOSIS — Z79899 Other long term (current) drug therapy: Secondary | ICD-10-CM | POA: Diagnosis not present

## 2016-01-02 DIAGNOSIS — D469 Myelodysplastic syndrome, unspecified: Secondary | ICD-10-CM | POA: Diagnosis not present

## 2016-01-02 DIAGNOSIS — T50905A Adverse effect of unspecified drugs, medicaments and biological substances, initial encounter: Secondary | ICD-10-CM | POA: Diagnosis not present

## 2016-01-02 DIAGNOSIS — Z853 Personal history of malignant neoplasm of breast: Secondary | ICD-10-CM | POA: Diagnosis not present

## 2016-01-02 DIAGNOSIS — I4891 Unspecified atrial fibrillation: Secondary | ICD-10-CM | POA: Diagnosis not present

## 2016-01-03 ENCOUNTER — Other Ambulatory Visit: Payer: Self-pay | Admitting: Nurse Practitioner

## 2016-01-03 DIAGNOSIS — D696 Thrombocytopenia, unspecified: Secondary | ICD-10-CM | POA: Diagnosis not present

## 2016-01-03 DIAGNOSIS — C9110 Chronic lymphocytic leukemia of B-cell type not having achieved remission: Secondary | ICD-10-CM | POA: Diagnosis not present

## 2016-01-03 DIAGNOSIS — F0390 Unspecified dementia without behavioral disturbance: Secondary | ICD-10-CM | POA: Diagnosis not present

## 2016-01-03 DIAGNOSIS — G936 Cerebral edema: Secondary | ICD-10-CM | POA: Diagnosis not present

## 2016-01-03 DIAGNOSIS — D709 Neutropenia, unspecified: Secondary | ICD-10-CM | POA: Diagnosis not present

## 2016-01-03 DIAGNOSIS — D469 Myelodysplastic syndrome, unspecified: Secondary | ICD-10-CM | POA: Diagnosis not present

## 2016-01-04 DIAGNOSIS — D469 Myelodysplastic syndrome, unspecified: Secondary | ICD-10-CM | POA: Diagnosis not present

## 2016-01-04 DIAGNOSIS — F0390 Unspecified dementia without behavioral disturbance: Secondary | ICD-10-CM | POA: Diagnosis not present

## 2016-01-04 DIAGNOSIS — G936 Cerebral edema: Secondary | ICD-10-CM | POA: Diagnosis not present

## 2016-01-04 DIAGNOSIS — C9110 Chronic lymphocytic leukemia of B-cell type not having achieved remission: Secondary | ICD-10-CM | POA: Diagnosis not present

## 2016-01-04 DIAGNOSIS — D696 Thrombocytopenia, unspecified: Secondary | ICD-10-CM | POA: Diagnosis not present

## 2016-01-04 DIAGNOSIS — D709 Neutropenia, unspecified: Secondary | ICD-10-CM | POA: Diagnosis not present

## 2016-01-08 DIAGNOSIS — D696 Thrombocytopenia, unspecified: Secondary | ICD-10-CM | POA: Diagnosis not present

## 2016-01-08 DIAGNOSIS — D709 Neutropenia, unspecified: Secondary | ICD-10-CM | POA: Diagnosis not present

## 2016-01-08 DIAGNOSIS — D469 Myelodysplastic syndrome, unspecified: Secondary | ICD-10-CM | POA: Diagnosis not present

## 2016-01-08 DIAGNOSIS — F0390 Unspecified dementia without behavioral disturbance: Secondary | ICD-10-CM | POA: Diagnosis not present

## 2016-01-08 DIAGNOSIS — C9110 Chronic lymphocytic leukemia of B-cell type not having achieved remission: Secondary | ICD-10-CM | POA: Diagnosis not present

## 2016-01-08 DIAGNOSIS — G936 Cerebral edema: Secondary | ICD-10-CM | POA: Diagnosis not present

## 2016-01-09 DIAGNOSIS — D469 Myelodysplastic syndrome, unspecified: Secondary | ICD-10-CM | POA: Diagnosis not present

## 2016-01-10 DIAGNOSIS — G936 Cerebral edema: Secondary | ICD-10-CM | POA: Diagnosis not present

## 2016-01-10 DIAGNOSIS — F0390 Unspecified dementia without behavioral disturbance: Secondary | ICD-10-CM | POA: Diagnosis not present

## 2016-01-10 DIAGNOSIS — D469 Myelodysplastic syndrome, unspecified: Secondary | ICD-10-CM | POA: Diagnosis not present

## 2016-01-10 DIAGNOSIS — C9110 Chronic lymphocytic leukemia of B-cell type not having achieved remission: Secondary | ICD-10-CM | POA: Diagnosis not present

## 2016-01-10 DIAGNOSIS — D709 Neutropenia, unspecified: Secondary | ICD-10-CM | POA: Diagnosis not present

## 2016-01-10 DIAGNOSIS — D696 Thrombocytopenia, unspecified: Secondary | ICD-10-CM | POA: Diagnosis not present

## 2016-01-12 DIAGNOSIS — G936 Cerebral edema: Secondary | ICD-10-CM | POA: Diagnosis not present

## 2016-01-12 DIAGNOSIS — D709 Neutropenia, unspecified: Secondary | ICD-10-CM | POA: Diagnosis not present

## 2016-01-12 DIAGNOSIS — D469 Myelodysplastic syndrome, unspecified: Secondary | ICD-10-CM | POA: Diagnosis not present

## 2016-01-12 DIAGNOSIS — D696 Thrombocytopenia, unspecified: Secondary | ICD-10-CM | POA: Diagnosis not present

## 2016-01-12 DIAGNOSIS — F0390 Unspecified dementia without behavioral disturbance: Secondary | ICD-10-CM | POA: Diagnosis not present

## 2016-01-12 DIAGNOSIS — C9110 Chronic lymphocytic leukemia of B-cell type not having achieved remission: Secondary | ICD-10-CM | POA: Diagnosis not present

## 2016-01-16 DIAGNOSIS — D469 Myelodysplastic syndrome, unspecified: Secondary | ICD-10-CM | POA: Diagnosis not present

## 2016-01-16 DIAGNOSIS — G936 Cerebral edema: Secondary | ICD-10-CM | POA: Diagnosis not present

## 2016-01-16 DIAGNOSIS — F0390 Unspecified dementia without behavioral disturbance: Secondary | ICD-10-CM | POA: Diagnosis not present

## 2016-01-16 DIAGNOSIS — D696 Thrombocytopenia, unspecified: Secondary | ICD-10-CM | POA: Diagnosis not present

## 2016-01-16 DIAGNOSIS — C9110 Chronic lymphocytic leukemia of B-cell type not having achieved remission: Secondary | ICD-10-CM | POA: Diagnosis not present

## 2016-01-16 DIAGNOSIS — D709 Neutropenia, unspecified: Secondary | ICD-10-CM | POA: Diagnosis not present

## 2016-01-17 DIAGNOSIS — D469 Myelodysplastic syndrome, unspecified: Secondary | ICD-10-CM | POA: Diagnosis not present

## 2016-01-18 DIAGNOSIS — C9110 Chronic lymphocytic leukemia of B-cell type not having achieved remission: Secondary | ICD-10-CM | POA: Diagnosis not present

## 2016-01-18 DIAGNOSIS — F0390 Unspecified dementia without behavioral disturbance: Secondary | ICD-10-CM | POA: Diagnosis not present

## 2016-01-18 DIAGNOSIS — D696 Thrombocytopenia, unspecified: Secondary | ICD-10-CM | POA: Diagnosis not present

## 2016-01-18 DIAGNOSIS — D709 Neutropenia, unspecified: Secondary | ICD-10-CM | POA: Diagnosis not present

## 2016-01-18 DIAGNOSIS — D469 Myelodysplastic syndrome, unspecified: Secondary | ICD-10-CM | POA: Diagnosis not present

## 2016-01-18 DIAGNOSIS — G936 Cerebral edema: Secondary | ICD-10-CM | POA: Diagnosis not present

## 2016-01-24 DIAGNOSIS — F0390 Unspecified dementia without behavioral disturbance: Secondary | ICD-10-CM | POA: Diagnosis not present

## 2016-01-24 DIAGNOSIS — G936 Cerebral edema: Secondary | ICD-10-CM | POA: Diagnosis not present

## 2016-01-24 DIAGNOSIS — C9110 Chronic lymphocytic leukemia of B-cell type not having achieved remission: Secondary | ICD-10-CM | POA: Diagnosis not present

## 2016-01-24 DIAGNOSIS — D469 Myelodysplastic syndrome, unspecified: Secondary | ICD-10-CM | POA: Diagnosis not present

## 2016-01-24 DIAGNOSIS — D709 Neutropenia, unspecified: Secondary | ICD-10-CM | POA: Diagnosis not present

## 2016-01-24 DIAGNOSIS — D696 Thrombocytopenia, unspecified: Secondary | ICD-10-CM | POA: Diagnosis not present

## 2016-01-25 DIAGNOSIS — D709 Neutropenia, unspecified: Secondary | ICD-10-CM | POA: Diagnosis not present

## 2016-01-25 DIAGNOSIS — F0390 Unspecified dementia without behavioral disturbance: Secondary | ICD-10-CM | POA: Diagnosis not present

## 2016-01-25 DIAGNOSIS — D696 Thrombocytopenia, unspecified: Secondary | ICD-10-CM | POA: Diagnosis not present

## 2016-01-25 DIAGNOSIS — G936 Cerebral edema: Secondary | ICD-10-CM | POA: Diagnosis not present

## 2016-01-25 DIAGNOSIS — C9110 Chronic lymphocytic leukemia of B-cell type not having achieved remission: Secondary | ICD-10-CM | POA: Diagnosis not present

## 2016-01-25 DIAGNOSIS — D469 Myelodysplastic syndrome, unspecified: Secondary | ICD-10-CM | POA: Diagnosis not present

## 2016-01-26 DIAGNOSIS — D696 Thrombocytopenia, unspecified: Secondary | ICD-10-CM | POA: Diagnosis not present

## 2016-01-26 DIAGNOSIS — F0390 Unspecified dementia without behavioral disturbance: Secondary | ICD-10-CM | POA: Diagnosis not present

## 2016-01-26 DIAGNOSIS — G936 Cerebral edema: Secondary | ICD-10-CM | POA: Diagnosis not present

## 2016-01-26 DIAGNOSIS — D469 Myelodysplastic syndrome, unspecified: Secondary | ICD-10-CM | POA: Diagnosis not present

## 2016-01-26 DIAGNOSIS — C9110 Chronic lymphocytic leukemia of B-cell type not having achieved remission: Secondary | ICD-10-CM | POA: Diagnosis not present

## 2016-01-26 DIAGNOSIS — D709 Neutropenia, unspecified: Secondary | ICD-10-CM | POA: Diagnosis not present

## 2016-01-29 ENCOUNTER — Other Ambulatory Visit: Payer: Self-pay | Admitting: Nurse Practitioner

## 2016-01-29 DIAGNOSIS — D709 Neutropenia, unspecified: Secondary | ICD-10-CM | POA: Diagnosis not present

## 2016-01-29 DIAGNOSIS — F0390 Unspecified dementia without behavioral disturbance: Secondary | ICD-10-CM | POA: Diagnosis not present

## 2016-01-29 DIAGNOSIS — D696 Thrombocytopenia, unspecified: Secondary | ICD-10-CM | POA: Diagnosis not present

## 2016-01-29 DIAGNOSIS — G936 Cerebral edema: Secondary | ICD-10-CM | POA: Diagnosis not present

## 2016-01-29 DIAGNOSIS — C9110 Chronic lymphocytic leukemia of B-cell type not having achieved remission: Secondary | ICD-10-CM | POA: Diagnosis not present

## 2016-01-29 DIAGNOSIS — D469 Myelodysplastic syndrome, unspecified: Secondary | ICD-10-CM | POA: Diagnosis not present

## 2016-01-30 DIAGNOSIS — C9200 Acute myeloblastic leukemia, not having achieved remission: Secondary | ICD-10-CM | POA: Diagnosis not present

## 2016-01-30 DIAGNOSIS — D469 Myelodysplastic syndrome, unspecified: Secondary | ICD-10-CM | POA: Diagnosis not present

## 2016-01-30 DIAGNOSIS — D61818 Other pancytopenia: Secondary | ICD-10-CM | POA: Diagnosis not present

## 2016-02-01 DIAGNOSIS — G936 Cerebral edema: Secondary | ICD-10-CM | POA: Diagnosis not present

## 2016-02-01 DIAGNOSIS — F0390 Unspecified dementia without behavioral disturbance: Secondary | ICD-10-CM | POA: Diagnosis not present

## 2016-02-01 DIAGNOSIS — D469 Myelodysplastic syndrome, unspecified: Secondary | ICD-10-CM | POA: Diagnosis not present

## 2016-02-01 DIAGNOSIS — D696 Thrombocytopenia, unspecified: Secondary | ICD-10-CM | POA: Diagnosis not present

## 2016-02-01 DIAGNOSIS — C9110 Chronic lymphocytic leukemia of B-cell type not having achieved remission: Secondary | ICD-10-CM | POA: Diagnosis not present

## 2016-02-01 DIAGNOSIS — D709 Neutropenia, unspecified: Secondary | ICD-10-CM | POA: Diagnosis not present

## 2016-02-02 DIAGNOSIS — C9110 Chronic lymphocytic leukemia of B-cell type not having achieved remission: Secondary | ICD-10-CM | POA: Diagnosis not present

## 2016-02-02 DIAGNOSIS — D696 Thrombocytopenia, unspecified: Secondary | ICD-10-CM | POA: Diagnosis not present

## 2016-02-02 DIAGNOSIS — D709 Neutropenia, unspecified: Secondary | ICD-10-CM | POA: Diagnosis not present

## 2016-02-02 DIAGNOSIS — G936 Cerebral edema: Secondary | ICD-10-CM | POA: Diagnosis not present

## 2016-02-02 DIAGNOSIS — D469 Myelodysplastic syndrome, unspecified: Secondary | ICD-10-CM | POA: Diagnosis not present

## 2016-02-02 DIAGNOSIS — F0390 Unspecified dementia without behavioral disturbance: Secondary | ICD-10-CM | POA: Diagnosis not present

## 2016-02-05 DIAGNOSIS — M6281 Muscle weakness (generalized): Secondary | ICD-10-CM | POA: Diagnosis not present

## 2016-02-05 DIAGNOSIS — Z853 Personal history of malignant neoplasm of breast: Secondary | ICD-10-CM | POA: Diagnosis not present

## 2016-02-05 DIAGNOSIS — Z923 Personal history of irradiation: Secondary | ICD-10-CM | POA: Diagnosis not present

## 2016-02-05 DIAGNOSIS — L89310 Pressure ulcer of right buttock, unstageable: Secondary | ICD-10-CM | POA: Diagnosis not present

## 2016-02-05 DIAGNOSIS — C9110 Chronic lymphocytic leukemia of B-cell type not having achieved remission: Secondary | ICD-10-CM | POA: Diagnosis not present

## 2016-02-05 DIAGNOSIS — D469 Myelodysplastic syndrome, unspecified: Secondary | ICD-10-CM | POA: Diagnosis not present

## 2016-02-05 DIAGNOSIS — Z87440 Personal history of urinary (tract) infections: Secondary | ICD-10-CM | POA: Diagnosis not present

## 2016-02-05 DIAGNOSIS — F0390 Unspecified dementia without behavioral disturbance: Secondary | ICD-10-CM | POA: Diagnosis not present

## 2016-02-05 DIAGNOSIS — G936 Cerebral edema: Secondary | ICD-10-CM | POA: Diagnosis not present

## 2016-02-05 DIAGNOSIS — D696 Thrombocytopenia, unspecified: Secondary | ICD-10-CM | POA: Diagnosis not present

## 2016-02-06 DIAGNOSIS — F0390 Unspecified dementia without behavioral disturbance: Secondary | ICD-10-CM | POA: Diagnosis not present

## 2016-02-06 DIAGNOSIS — C9200 Acute myeloblastic leukemia, not having achieved remission: Secondary | ICD-10-CM | POA: Diagnosis not present

## 2016-02-06 DIAGNOSIS — Z856 Personal history of leukemia: Secondary | ICD-10-CM | POA: Diagnosis not present

## 2016-02-06 DIAGNOSIS — D469 Myelodysplastic syndrome, unspecified: Secondary | ICD-10-CM | POA: Diagnosis not present

## 2016-02-06 DIAGNOSIS — Z95828 Presence of other vascular implants and grafts: Secondary | ICD-10-CM | POA: Diagnosis not present

## 2016-02-06 DIAGNOSIS — Z86000 Personal history of in-situ neoplasm of breast: Secondary | ICD-10-CM | POA: Diagnosis not present

## 2016-02-09 DIAGNOSIS — C9200 Acute myeloblastic leukemia, not having achieved remission: Secondary | ICD-10-CM | POA: Diagnosis not present

## 2016-02-12 DIAGNOSIS — C9110 Chronic lymphocytic leukemia of B-cell type not having achieved remission: Secondary | ICD-10-CM | POA: Diagnosis not present

## 2016-02-12 DIAGNOSIS — F0390 Unspecified dementia without behavioral disturbance: Secondary | ICD-10-CM | POA: Diagnosis not present

## 2016-02-12 DIAGNOSIS — M6281 Muscle weakness (generalized): Secondary | ICD-10-CM | POA: Diagnosis not present

## 2016-02-12 DIAGNOSIS — L89310 Pressure ulcer of right buttock, unstageable: Secondary | ICD-10-CM | POA: Diagnosis not present

## 2016-02-12 DIAGNOSIS — D469 Myelodysplastic syndrome, unspecified: Secondary | ICD-10-CM | POA: Diagnosis not present

## 2016-02-12 DIAGNOSIS — G936 Cerebral edema: Secondary | ICD-10-CM | POA: Diagnosis not present

## 2016-02-13 DIAGNOSIS — Z87440 Personal history of urinary (tract) infections: Secondary | ICD-10-CM | POA: Diagnosis not present

## 2016-02-13 DIAGNOSIS — R197 Diarrhea, unspecified: Secondary | ICD-10-CM | POA: Diagnosis not present

## 2016-02-13 DIAGNOSIS — C9110 Chronic lymphocytic leukemia of B-cell type not having achieved remission: Secondary | ICD-10-CM | POA: Diagnosis not present

## 2016-02-13 DIAGNOSIS — F0390 Unspecified dementia without behavioral disturbance: Secondary | ICD-10-CM | POA: Diagnosis not present

## 2016-02-13 DIAGNOSIS — L89329 Pressure ulcer of left buttock, unspecified stage: Secondary | ICD-10-CM | POA: Diagnosis not present

## 2016-02-13 DIAGNOSIS — C9200 Acute myeloblastic leukemia, not having achieved remission: Secondary | ICD-10-CM | POA: Diagnosis not present

## 2016-02-13 DIAGNOSIS — R32 Unspecified urinary incontinence: Secondary | ICD-10-CM | POA: Diagnosis not present

## 2016-02-13 DIAGNOSIS — Z853 Personal history of malignant neoplasm of breast: Secondary | ICD-10-CM | POA: Diagnosis not present

## 2016-02-13 DIAGNOSIS — L89310 Pressure ulcer of right buttock, unstageable: Secondary | ICD-10-CM | POA: Diagnosis not present

## 2016-02-13 DIAGNOSIS — G936 Cerebral edema: Secondary | ICD-10-CM | POA: Diagnosis not present

## 2016-02-13 DIAGNOSIS — M6281 Muscle weakness (generalized): Secondary | ICD-10-CM | POA: Diagnosis not present

## 2016-02-13 DIAGNOSIS — D709 Neutropenia, unspecified: Secondary | ICD-10-CM | POA: Diagnosis not present

## 2016-02-13 DIAGNOSIS — R300 Dysuria: Secondary | ICD-10-CM | POA: Diagnosis not present

## 2016-02-13 DIAGNOSIS — D469 Myelodysplastic syndrome, unspecified: Secondary | ICD-10-CM | POA: Diagnosis not present

## 2016-02-14 DIAGNOSIS — C9110 Chronic lymphocytic leukemia of B-cell type not having achieved remission: Secondary | ICD-10-CM | POA: Diagnosis not present

## 2016-02-14 DIAGNOSIS — M6281 Muscle weakness (generalized): Secondary | ICD-10-CM | POA: Diagnosis not present

## 2016-02-14 DIAGNOSIS — D469 Myelodysplastic syndrome, unspecified: Secondary | ICD-10-CM | POA: Diagnosis not present

## 2016-02-14 DIAGNOSIS — L89310 Pressure ulcer of right buttock, unstageable: Secondary | ICD-10-CM | POA: Diagnosis not present

## 2016-02-14 DIAGNOSIS — G936 Cerebral edema: Secondary | ICD-10-CM | POA: Diagnosis not present

## 2016-02-14 DIAGNOSIS — F0390 Unspecified dementia without behavioral disturbance: Secondary | ICD-10-CM | POA: Diagnosis not present

## 2016-02-15 DIAGNOSIS — M6281 Muscle weakness (generalized): Secondary | ICD-10-CM | POA: Diagnosis not present

## 2016-02-15 DIAGNOSIS — L89310 Pressure ulcer of right buttock, unstageable: Secondary | ICD-10-CM | POA: Diagnosis not present

## 2016-02-15 DIAGNOSIS — F0390 Unspecified dementia without behavioral disturbance: Secondary | ICD-10-CM | POA: Diagnosis not present

## 2016-02-15 DIAGNOSIS — G936 Cerebral edema: Secondary | ICD-10-CM | POA: Diagnosis not present

## 2016-02-15 DIAGNOSIS — C9110 Chronic lymphocytic leukemia of B-cell type not having achieved remission: Secondary | ICD-10-CM | POA: Diagnosis not present

## 2016-02-15 DIAGNOSIS — D469 Myelodysplastic syndrome, unspecified: Secondary | ICD-10-CM | POA: Diagnosis not present

## 2016-02-16 DIAGNOSIS — B952 Enterococcus as the cause of diseases classified elsewhere: Secondary | ICD-10-CM | POA: Diagnosis not present

## 2016-02-16 DIAGNOSIS — E43 Unspecified severe protein-calorie malnutrition: Secondary | ICD-10-CM | POA: Diagnosis not present

## 2016-02-16 DIAGNOSIS — E46 Unspecified protein-calorie malnutrition: Secondary | ICD-10-CM | POA: Diagnosis not present

## 2016-02-16 DIAGNOSIS — Z79899 Other long term (current) drug therapy: Secondary | ICD-10-CM | POA: Diagnosis not present

## 2016-02-16 DIAGNOSIS — E876 Hypokalemia: Secondary | ICD-10-CM | POA: Diagnosis not present

## 2016-02-16 DIAGNOSIS — J9601 Acute respiratory failure with hypoxia: Secondary | ICD-10-CM | POA: Diagnosis not present

## 2016-02-16 DIAGNOSIS — R140 Abdominal distension (gaseous): Secondary | ICD-10-CM | POA: Diagnosis not present

## 2016-02-16 DIAGNOSIS — D61818 Other pancytopenia: Secondary | ICD-10-CM | POA: Diagnosis not present

## 2016-02-16 DIAGNOSIS — N390 Urinary tract infection, site not specified: Secondary | ICD-10-CM | POA: Diagnosis not present

## 2016-02-16 DIAGNOSIS — R000 Tachycardia, unspecified: Secondary | ICD-10-CM | POA: Diagnosis not present

## 2016-02-16 DIAGNOSIS — R300 Dysuria: Secondary | ICD-10-CM | POA: Diagnosis not present

## 2016-02-16 DIAGNOSIS — Z86000 Personal history of in-situ neoplasm of breast: Secondary | ICD-10-CM | POA: Diagnosis not present

## 2016-02-16 DIAGNOSIS — A4901 Methicillin susceptible Staphylococcus aureus infection, unspecified site: Secondary | ICD-10-CM | POA: Diagnosis not present

## 2016-02-16 DIAGNOSIS — T451X5A Adverse effect of antineoplastic and immunosuppressive drugs, initial encounter: Secondary | ICD-10-CM | POA: Diagnosis present

## 2016-02-16 DIAGNOSIS — R197 Diarrhea, unspecified: Secondary | ICD-10-CM | POA: Diagnosis not present

## 2016-02-16 DIAGNOSIS — R9431 Abnormal electrocardiogram [ECG] [EKG]: Secondary | ICD-10-CM | POA: Diagnosis not present

## 2016-02-16 DIAGNOSIS — Z4659 Encounter for fitting and adjustment of other gastrointestinal appliance and device: Secondary | ICD-10-CM | POA: Diagnosis not present

## 2016-02-16 DIAGNOSIS — D61810 Antineoplastic chemotherapy induced pancytopenia: Secondary | ICD-10-CM | POA: Diagnosis not present

## 2016-02-16 DIAGNOSIS — D709 Neutropenia, unspecified: Secondary | ICD-10-CM | POA: Diagnosis not present

## 2016-02-16 DIAGNOSIS — B9561 Methicillin susceptible Staphylococcus aureus infection as the cause of diseases classified elsewhere: Secondary | ICD-10-CM | POA: Diagnosis not present

## 2016-02-16 DIAGNOSIS — E039 Hypothyroidism, unspecified: Secondary | ICD-10-CM | POA: Diagnosis not present

## 2016-02-16 DIAGNOSIS — F0280 Dementia in other diseases classified elsewhere without behavioral disturbance: Secondary | ICD-10-CM | POA: Diagnosis not present

## 2016-02-16 DIAGNOSIS — Z5111 Encounter for antineoplastic chemotherapy: Secondary | ICD-10-CM | POA: Diagnosis not present

## 2016-02-16 DIAGNOSIS — D329 Benign neoplasm of meninges, unspecified: Secondary | ICD-10-CM | POA: Diagnosis present

## 2016-02-16 DIAGNOSIS — Z66 Do not resuscitate: Secondary | ICD-10-CM | POA: Diagnosis present

## 2016-02-16 DIAGNOSIS — I480 Paroxysmal atrial fibrillation: Secondary | ICD-10-CM | POA: Diagnosis not present

## 2016-02-16 DIAGNOSIS — Z87440 Personal history of urinary (tract) infections: Secondary | ICD-10-CM | POA: Diagnosis not present

## 2016-02-16 DIAGNOSIS — I4891 Unspecified atrial fibrillation: Secondary | ICD-10-CM | POA: Diagnosis not present

## 2016-02-16 DIAGNOSIS — I5033 Acute on chronic diastolic (congestive) heart failure: Secondary | ICD-10-CM | POA: Diagnosis not present

## 2016-02-16 DIAGNOSIS — Z515 Encounter for palliative care: Secondary | ICD-10-CM | POA: Diagnosis not present

## 2016-02-16 DIAGNOSIS — R918 Other nonspecific abnormal finding of lung field: Secondary | ICD-10-CM | POA: Diagnosis not present

## 2016-02-16 DIAGNOSIS — A4101 Sepsis due to Methicillin susceptible Staphylococcus aureus: Secondary | ICD-10-CM | POA: Diagnosis not present

## 2016-02-16 DIAGNOSIS — I482 Chronic atrial fibrillation: Secondary | ICD-10-CM | POA: Diagnosis not present

## 2016-02-16 DIAGNOSIS — J90 Pleural effusion, not elsewhere classified: Secondary | ICD-10-CM | POA: Diagnosis not present

## 2016-02-16 DIAGNOSIS — B957 Other staphylococcus as the cause of diseases classified elsewhere: Secondary | ICD-10-CM | POA: Diagnosis not present

## 2016-02-16 DIAGNOSIS — R7881 Bacteremia: Secondary | ICD-10-CM | POA: Diagnosis not present

## 2016-02-16 DIAGNOSIS — F0390 Unspecified dementia without behavioral disturbance: Secondary | ICD-10-CM | POA: Diagnosis present

## 2016-02-16 DIAGNOSIS — R5081 Fever presenting with conditions classified elsewhere: Secondary | ICD-10-CM | POA: Diagnosis not present

## 2016-02-16 DIAGNOSIS — R32 Unspecified urinary incontinence: Secondary | ICD-10-CM | POA: Diagnosis not present

## 2016-02-16 DIAGNOSIS — R6520 Severe sepsis without septic shock: Secondary | ICD-10-CM | POA: Diagnosis not present

## 2016-02-16 DIAGNOSIS — D696 Thrombocytopenia, unspecified: Secondary | ICD-10-CM | POA: Diagnosis not present

## 2016-02-16 DIAGNOSIS — Z681 Body mass index (BMI) 19 or less, adult: Secondary | ICD-10-CM | POA: Diagnosis not present

## 2016-02-16 DIAGNOSIS — L89153 Pressure ulcer of sacral region, stage 3: Secondary | ICD-10-CM | POA: Diagnosis present

## 2016-02-16 DIAGNOSIS — C9200 Acute myeloblastic leukemia, not having achieved remission: Secondary | ICD-10-CM | POA: Diagnosis not present

## 2016-02-16 DIAGNOSIS — R6521 Severe sepsis with septic shock: Secondary | ICD-10-CM | POA: Diagnosis not present

## 2016-02-16 DIAGNOSIS — I471 Supraventricular tachycardia: Secondary | ICD-10-CM | POA: Diagnosis not present

## 2016-02-16 DIAGNOSIS — J690 Pneumonitis due to inhalation of food and vomit: Secondary | ICD-10-CM | POA: Diagnosis not present

## 2016-02-16 DIAGNOSIS — D469 Myelodysplastic syndrome, unspecified: Secondary | ICD-10-CM | POA: Diagnosis not present

## 2016-02-16 DIAGNOSIS — L89313 Pressure ulcer of right buttock, stage 3: Secondary | ICD-10-CM | POA: Diagnosis present

## 2016-02-16 DIAGNOSIS — L89329 Pressure ulcer of left buttock, unspecified stage: Secondary | ICD-10-CM | POA: Diagnosis not present

## 2016-02-16 DIAGNOSIS — F068 Other specified mental disorders due to known physiological condition: Secondary | ICD-10-CM | POA: Diagnosis not present

## 2016-02-19 DIAGNOSIS — I4891 Unspecified atrial fibrillation: Secondary | ICD-10-CM | POA: Diagnosis not present

## 2016-02-25 DIAGNOSIS — D709 Neutropenia, unspecified: Secondary | ICD-10-CM | POA: Diagnosis not present

## 2016-02-25 DIAGNOSIS — D61810 Antineoplastic chemotherapy induced pancytopenia: Secondary | ICD-10-CM | POA: Diagnosis not present

## 2016-02-25 DIAGNOSIS — R5081 Fever presenting with conditions classified elsewhere: Secondary | ICD-10-CM | POA: Diagnosis not present

## 2016-02-25 DIAGNOSIS — A4101 Sepsis due to Methicillin susceptible Staphylococcus aureus: Secondary | ICD-10-CM | POA: Diagnosis not present

## 2016-02-25 DIAGNOSIS — C9200 Acute myeloblastic leukemia, not having achieved remission: Secondary | ICD-10-CM | POA: Diagnosis not present

## 2016-02-25 DIAGNOSIS — R6521 Severe sepsis with septic shock: Secondary | ICD-10-CM | POA: Diagnosis not present

## 2016-02-25 DIAGNOSIS — D61818 Other pancytopenia: Secondary | ICD-10-CM | POA: Diagnosis not present

## 2016-03-21 DEATH — deceased

## 2017-11-04 IMAGING — DX DG CHEST 1V PORT
1 series · 1 of 1 positions shown · non-contrast
Comparison: 08/19/2011

CLINICAL DATA: Flank pain.  Generalized weakness.

EXAM:
PORTABLE CHEST 1 VIEW

[chest ap]
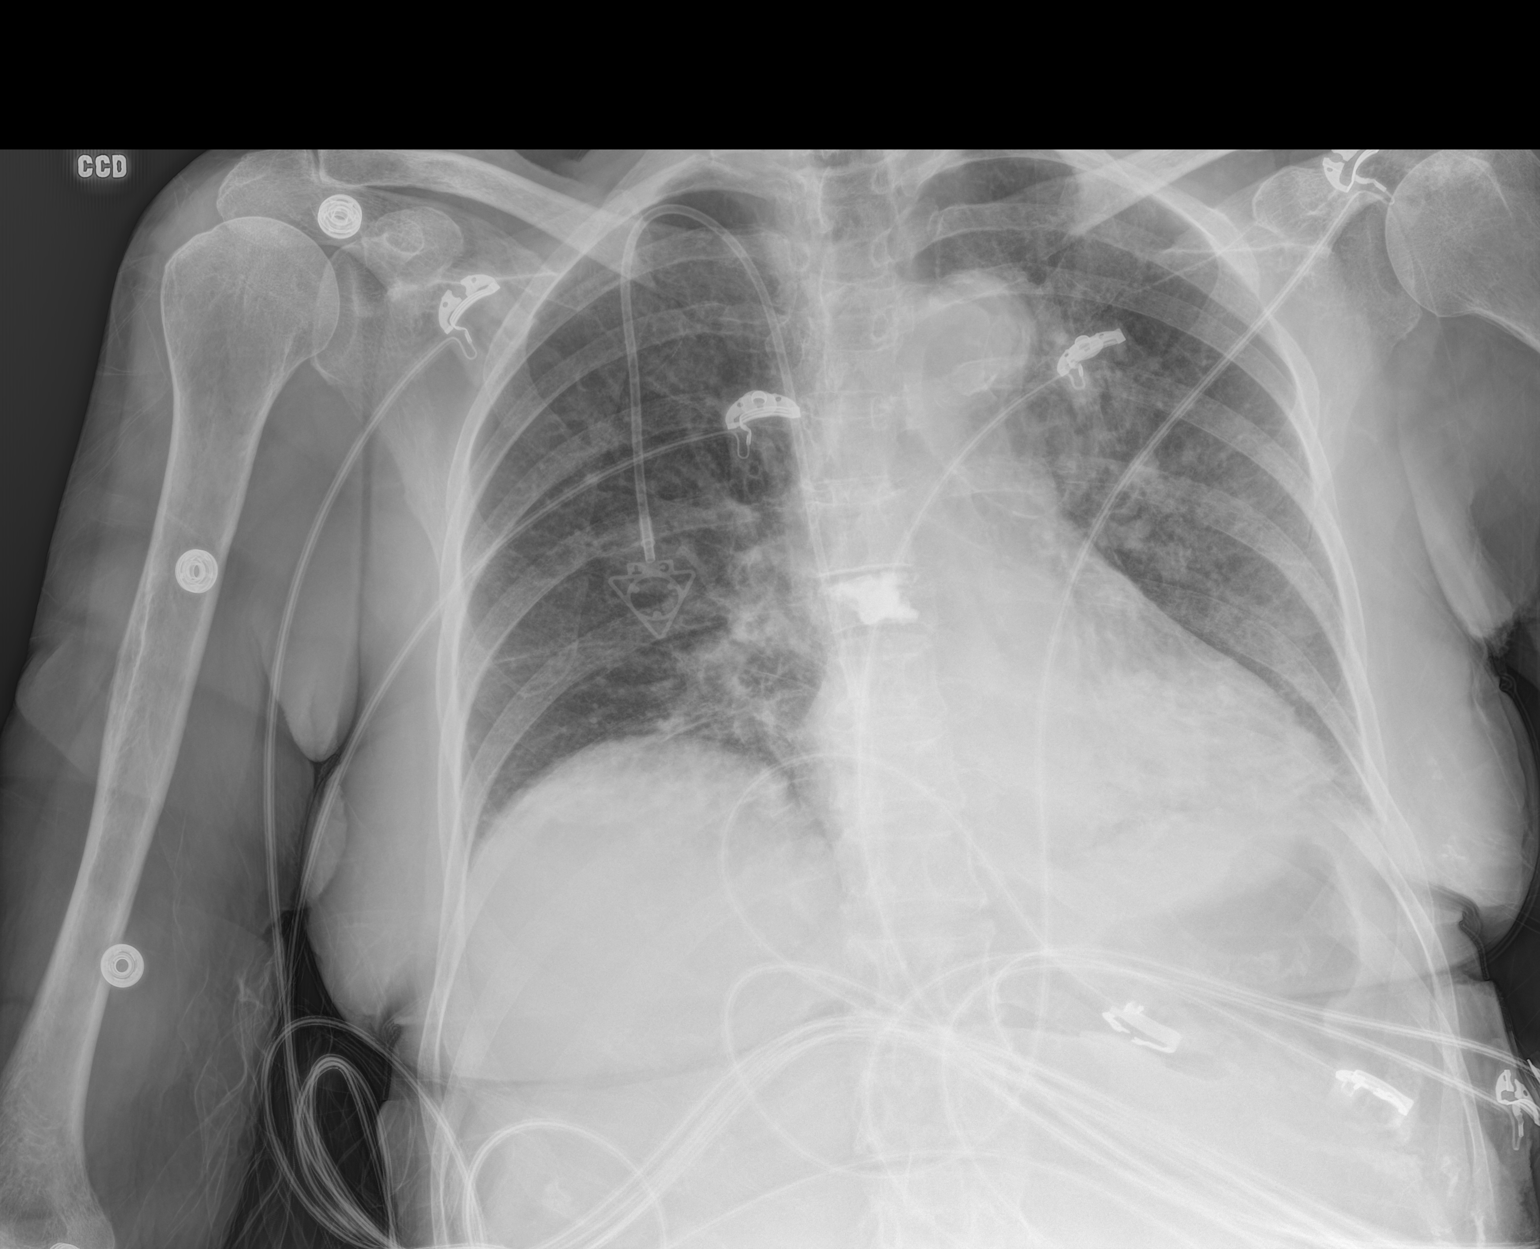

[1 of 1 positions shown; findings below may reference images not displayed]

FINDINGS: There is a right chest wall port a catheter with tip in the right
atrium. Mild cardiac enlargement. Aortic atherosclerosis identified.
There is mild diffuse pulmonary edema. Atelectasis is identified
within the left base.
IMPRESSION: 1. Mild pulmonary edema.
2. Left base atelectasis.
